# Patient Record
Sex: Male | Born: 1965 | Race: White | Hispanic: No | Marital: Married | Smoking: Never smoker
Health system: Southern US, Community
[De-identification: ages and names within clinical notes are randomized; demographics above are authoritative.]

## PROBLEM LIST (undated history)

## (undated) ENCOUNTER — Ambulatory Visit: Admission: EM | Source: Home / Self Care

## (undated) DIAGNOSIS — J45909 Unspecified asthma, uncomplicated: Secondary | ICD-10-CM

## (undated) DIAGNOSIS — F32A Depression, unspecified: Secondary | ICD-10-CM

## (undated) DIAGNOSIS — I10 Essential (primary) hypertension: Secondary | ICD-10-CM

## (undated) HISTORY — PX: KNEE ARTHROSCOPY: SUR90

## (undated) HISTORY — DX: Depression, unspecified: F32.A

## (undated) HISTORY — PX: CHOLECYSTECTOMY: SHX55

## (undated) HISTORY — PX: NECK SURGERY: SHX720

## (undated) HISTORY — PX: REPLACEMENT TOTAL KNEE: SUR1224

## (undated) HISTORY — DX: Unspecified asthma, uncomplicated: J45.909

## (undated) HISTORY — PX: LUMBAR DISC SURGERY: SHX700

## (undated) HISTORY — DX: Essential (primary) hypertension: I10

---

## 2008-08-02 ENCOUNTER — Encounter: Admission: RE | Admit: 2008-08-02 | Discharge: 2008-08-02 | Payer: Self-pay | Admitting: Orthopedic Surgery

## 2008-09-06 ENCOUNTER — Ambulatory Visit (HOSPITAL_COMMUNITY): Admission: RE | Admit: 2008-09-06 | Discharge: 2008-09-07 | Payer: Self-pay | Admitting: Neurosurgery

## 2011-03-19 NOTE — Op Note (Signed)
NAMEJAILIN, Erik Ellis              ACCOUNT NO.:  [id number]   MEDICAL RECORD NO.:  [id number]          PATIENT TYPE:  OIB   LOCATION:  3599                         FACILITY:  [facility]   PHYSICIAN:  Clydene Fake, M.D.  DATE OF BIRTH:  09-19-1966   DATE OF PROCEDURE:  09/06/2008  DATE OF DISCHARGE:                               OPERATIVE REPORT   PREOPERATIVE DIAGNOSIS:  Herniated nucleus pulposus, C6-C7 with  radiculopathy.   POSTOPERATIVE DIAGNOSIS:  Herniated nucleus pulposus, C6-C7 with  radiculopathy.   PROCEDURE:  Anterior cervical decompression, diskectomy and fusion at C6-  C7 with LifeNet allograft bone and Trestle anterior cervical plate.   SURGEON:  Clydene Fake, MD   ASSISTANT:  Payton Doughty, MD   ANESTHESIA:  General endotracheal tube anesthesia.   ESTIMATED BLOOD LOSS:  Minimal.   BLOOD GIVEN:  None.   COMPLICATIONS:  None.   REASON FOR PROCEDURE:  The patient is a 45 year old gentleman with neck  and left arm pain, numbness, and weakness.  An MRI showing large disk  herniation at right side at C6-C7 and probably a small disk herniation  in the foramen on the left side causing femoral narrowing and left C7  radicular compression.  It was recommended that the patient undergo a  surgery.  Because of the weakness, he wanted a little more time.  We  tried to reduce back his pain medicines, he still had significant pain  and weakness, even an epidural injection but it did not give any lasting  relief and he decided to proceed with surgery.   PROCEDURE IN DETAIL:  The patient was brought to the operating room and  general anesthesia was induced.  The patient was placed in 10-pound  halter traction, prepped and draped in a sterile fashion.  The area of  incision injected with 10 mL of 1% lidocaine with epinephrine.  Incision  was then made in the anterior cervical spine on the left side from  midline to the anterior border of sternocleidomastoid muscle.   Incision  was taken down to the platysma and hemostasis was obtained with Bovie  cauterization.  The platysma was incised with Bovie and blunt dissection  taken through the anterior cervical fascia to the anterior cervical  spine.  Needle was placed in the interspace.  X-rays obtained showing  the needle was at the C6-C7 interspace.  Disk space was incised.  Partial diskectomy was performed with pituitary rongeurs and the needle  was removed.  Longus coli muscles were reflected laterally on each side  using the Bovie and self-retaining retractors were placed.  Distraction  pins were placed at the C6 and C7 interspace and distracted the disk  space.  It was again incised with a #15 blade and diskectomy performed  with pituitary rongeurs and curettes.  Microscope was brought in for  microdissection.  At this point, a high-speed and 1-2 mm Kerrison  punches were used to remove the posterior disk osteophytes and ligament  and performed bilateral foraminotomies.  There was annular tear and free  fragmented disk in the paracentral region on the right  that was seen and  then we removed some multiple free fragments from the left foramen that  was causing compression of the nerve root out on the foramen.  When we  were finished, we had good central and lateral decompressions  bilaterally at C6-C7 level.  High-speed drill was used to remove  cartilaginous endplates.  We measured the height of disk space to be 6  mm and a 6-mm LifeNet allograft bone was tapped into place and  countersunk couple of millimeters.  Weight was removed from the  traction.  Distraction was removed and distraction pins were removed.  Hemostasis was obtained with Gelfoam and thrombin.  We irrigated with  antibiotic solution.  We had good hemostasis.  The Trestle anterior  cervical plate was placed over the anterior cervical spine.  Two screws  were placed in the C6 and two in the C7.  These were tightened down and  lateral  x-rays were obtained showing good position of plates, screws,  and bone plug at the C6-C7 level.  Retractors were removed.  We  irrigated with antibiotic solution with good hemostasis and the platysma  was closed with 3-0 Vicryl interrupted sutures.  Subcutaneous tissue  closed with 3-0 Vicryl interrupted sutures.  Skin closed with benzoin  and Steri-Strip.  Dressing was placed.  The patient was placed in a soft  cervical collar, awoken from anesthesia, and transferred to recovery  room in a stable condition.           ______________________________  Clydene Fake, M.D.     JRH/MEDQ  D:  09/06/2008  T:  09/07/2008  Job:  [id number]

## 2011-08-06 LAB — URINALYSIS, ROUTINE W REFLEX MICROSCOPIC
Hgb urine dipstick: NEGATIVE
Ketones, ur: NEGATIVE
Nitrite: NEGATIVE
Protein, ur: NEGATIVE
Specific Gravity, Urine: 1.019

## 2011-08-06 LAB — BASIC METABOLIC PANEL
Chloride: 103
GFR calc Af Amer: >60
GFR calc non Af Amer: >60
Glucose, Bld: 94

## 2011-08-06 LAB — CBC
MCHC: 33.8
MCV: 98.4
RBC: 4.35

## 2011-12-04 DIAGNOSIS — G479 Sleep disorder, unspecified: Secondary | ICD-10-CM | POA: Insufficient documentation

## 2012-09-02 DIAGNOSIS — K5790 Diverticulosis of intestine, part unspecified, without perforation or abscess without bleeding: Secondary | ICD-10-CM | POA: Insufficient documentation

## 2012-09-02 DIAGNOSIS — Z9889 Other specified postprocedural states: Secondary | ICD-10-CM | POA: Insufficient documentation

## 2014-08-15 ENCOUNTER — Emergency Department (HOSPITAL_COMMUNITY): Payer: BC Managed Care – PPO

## 2014-08-15 ENCOUNTER — Encounter (HOSPITAL_COMMUNITY): Payer: Self-pay | Admitting: Emergency Medicine

## 2014-08-15 ENCOUNTER — Emergency Department (HOSPITAL_COMMUNITY)
Admission: EM | Admit: 2014-08-15 | Discharge: 2014-08-15 | Disposition: A | Discharge status: Home / Self Care | Payer: BC Managed Care – PPO | Attending: Emergency Medicine | Admitting: Emergency Medicine

## 2014-08-15 DIAGNOSIS — R079 Chest pain, unspecified: Secondary | ICD-10-CM | POA: Diagnosis present

## 2014-08-15 DIAGNOSIS — Z7951 Long term (current) use of inhaled steroids: Secondary | ICD-10-CM | POA: Insufficient documentation

## 2014-08-15 DIAGNOSIS — M549 Dorsalgia, unspecified: Secondary | ICD-10-CM | POA: Insufficient documentation

## 2014-08-15 DIAGNOSIS — Z79899 Other long term (current) drug therapy: Secondary | ICD-10-CM | POA: Diagnosis not present

## 2014-08-15 LAB — CBC WITH DIFFERENTIAL/PLATELET
Basophils Absolute: 0 10*3/uL (ref 0.0–0.1)
Basophils Relative: 0 % (ref 0–1)
EOS ABS: 0.1 10*3/uL (ref 0.0–0.7)
EOS PCT: 1 % (ref 0–5)
HCT: 42.3 % (ref 39.0–52.0)
HEMOGLOBIN: 14.2 g/dL (ref 13.0–17.0)
LYMPHS PCT: 13 % (ref 12–46)
Lymphs Abs: 1.4 10*3/uL (ref 0.7–4.0)
MCH: 31.6 pg (ref 26.0–34.0)
MCHC: 33.6 g/dL (ref 30.0–36.0)
MCV: 94 fL (ref 78.0–100.0)
Monocytes Absolute: 0.5 10*3/uL (ref 0.1–1.0)
Monocytes Relative: 5 % (ref 3–12)
Neutro Abs: 8.8 10*3/uL — ABNORMAL HIGH (ref 1.7–7.7)
Neutrophils Relative %: 81 % — ABNORMAL HIGH (ref 43–77)
Platelets: 276 10*3/uL (ref 150–400)
RBC: 4.5 MIL/uL (ref 4.22–5.81)
RDW: 12.4 % (ref 11.5–15.5)
WBC: 10.9 10*3/uL — AB (ref 4.0–10.5)

## 2014-08-15 LAB — BASIC METABOLIC PANEL
Anion gap: 9 (ref 5–15)
BUN: 19 mg/dL (ref 6–23)
CALCIUM: 10 mg/dL (ref 8.4–10.5)
CO2: 29 mEq/L (ref 19–32)
Chloride: 105 mEq/L (ref 96–112)
Creatinine, Ser: 1.28 mg/dL (ref 0.50–1.35)
GFR calc Af Amer: 74 mL/min — ABNORMAL LOW (ref >90)
GFR, EST NON AFRICAN AMERICAN: 64 mL/min — AB (ref >90)
Glucose, Bld: 87 mg/dL (ref 70–99)
Potassium: 4.4 mEq/L (ref 3.7–5.3)
SODIUM: 143 meq/L (ref 137–147)

## 2014-08-15 LAB — TROPONIN I: Troponin I: <0.3 ng/mL (ref <0.30)

## 2014-08-15 LAB — I-STAT TROPONIN, ED: Troponin i, poc: 0 ng/mL (ref 0.00–0.08)

## 2014-08-15 MED ORDER — CYCLOBENZAPRINE HCL 10 MG PO TABS: 10.0000 mg | ORAL_TABLET | Freq: Two times a day (BID) | ORAL | Status: DC | PRN

## 2014-08-15 MED ORDER — NAPROXEN 250 MG PO TABS
500.0000 mg | ORAL_TABLET | Freq: Once | ORAL | Status: AC
  Administered 2014-08-15: 500 mg via ORAL

## 2014-08-15 MED ORDER — CYCLOBENZAPRINE HCL 10 MG PO TABS
ORAL_TABLET | ORAL | Status: AC
  Filled 2014-08-15: qty 1

## 2014-08-15 MED ORDER — NAPROXEN 500 MG PO TABS: 500.0000 mg | ORAL_TABLET | Freq: Two times a day (BID) | ORAL | Status: DC

## 2014-08-15 MED ORDER — NAPROXEN 250 MG PO TABS
ORAL_TABLET | ORAL | Status: AC
  Filled 2014-08-15: qty 2

## 2014-08-15 MED ORDER — CYCLOBENZAPRINE HCL 10 MG PO TABS
10.0000 mg | ORAL_TABLET | Freq: Once | ORAL | Status: AC
  Administered 2014-08-15: 10 mg via ORAL

## 2014-08-15 MED ORDER — KETOROLAC TROMETHAMINE 30 MG/ML IJ SOLN
30.0000 mg | Freq: Once | INTRAMUSCULAR | Status: AC
  Administered 2014-08-15: 30 mg via INTRAVENOUS
  Filled 2014-08-15: qty 1

## 2014-08-15 NOTE — ED Provider Notes (Signed)
CSN: [id number]     Arrival date & time 08/15/14  [id number] History  This chart was scribed for Donnetta HutchingBrian Loraina Stauffer, MD by Gwenyth Oberatherine Macek, ED Scribe. This patient was seen in room APA12/APA12 and the patient's care was started at 8:10 PM.    Chief Complaint  Patient presents with  . Chest Pain   The history is provided by the patient. No language interpreter was used.   HPI Comments: Vikki PortsMichael Cecena is a 48 y.o. 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 He notes feeling flushed as an associated symptom. Pt states he became excited during football practice and noted feeling CP in a team meeting. Pt denies SOB and diaphoresis as associated symptoms. Pt has history of knee surgeries, cholecystectomy, and a ruptured disk in cervical spine. Pt denies a history of HTN, DM, and HLD. He also denies smoking and family history of CAD.   History reviewed. No pertinent past medical history. Past Surgical History  Procedure Laterality Date  . Knee arthroscopy    . Cholecystectomy    . Neck surgery     History reviewed. No pertinent family history. History  Substance Use Topics  . Smoking status: Never Smoker   . Smokeless tobacco: Not on file  . Alcohol Use: No    Review of Systems  Constitutional: Negative for diaphoresis.  Respiratory: Negative for shortness of breath.   Cardiovascular: Positive for chest pain.  Musculoskeletal: Positive for back pain.    10 Systems reviewed and all are negative for acute change except as noted in the HPI.  Allergies  Review of patient's allergies indicates no known allergies.  Home Medications   Prior to Admission medications   Medication Sig Start Date End Date Taking? Authorizing Provider  albuterol (PROVENTIL HFA;VENTOLIN HFA) 108 (90 BASE) MCG/ACT inhaler Inhale 2 puffs into the lungs every 4 (four)  hours as needed. For shortness of breath/wheezing 02/17/14  Yes Historical Provider, MD  amphetamine-dextroamphetamine (ADDERALL) 20 MG tablet Take 20 mg by mouth every morning.   Yes Historical Provider, MD  buPROPion (WELLBUTRIN XL) 300 MG 24 hr tablet Take 1 tablet by mouth daily. 07/27/14  Yes Historical Provider, MD  busPIRone (BUSPAR) 15 MG tablet Take 30 mg by mouth 2 (two) times daily. 07/24/14  Yes Historical Provider, MD  lamoTRIgine (LAMICTAL) 200 MG tablet Take 200 mg by mouth 2 (two) times daily. 08/14/14  Yes Historical Provider, MD  lithium carbonate (LITHOBID) 300 MG CR tablet Take 1,200 mg by mouth at bedtime. 07/06/14  Yes Historical Provider, MD  mometasone-formoterol (DULERA) 100-5 MCG/ACT AERO Inhale 2 puffs into the lungs 2 (two) times daily. 05/23/14  Yes Historical Provider, MD  zolpidem (AMBIEN) 10 MG tablet Take 5-10 mg by mouth at bedtime.  07/26/14  Yes Historical Provider, MD  cyclobenzaprine (FLEXERIL) 10 MG tablet Take 1 tablet (10 mg total) by mouth 2 (two) times daily as needed for muscle spasms. 08/15/14   Donnetta HutchingBrian Fields Oros, MD  naproxen (NAPROSYN) 500 MG tablet Take 1 tablet (500 mg total) by mouth 2 (two) times daily. 08/15/14   Donnetta HutchingBrian Jordyan Hardiman, MD   BP 116/73  Pulse 58  Temp(Src) 98.6 F (37 C) (Oral)  Resp 14  Ht 5\' 9"  (1.753 m)  Wt 172 lb (78.019 kg)  BMI 25.39 kg/m2  SpO2 100% Physical Exam  Nursing note and vitals reviewed. Constitutional: He is  oriented to person, place, and time. He appears well-developed and well-nourished.  HENT:  Head: Normocephalic and atraumatic.  Eyes: Conjunctivae and EOM are normal. Pupils are equal, round, and reactive to light.  Neck: Normal range of motion. Neck supple.  Cardiovascular: Normal rate, regular rhythm and normal heart sounds.   Pulmonary/Chest: Effort normal and breath sounds normal.  Abdominal: Soft. Bowel sounds are normal.  Musculoskeletal: Normal range of motion.  Neurological: He is alert and oriented to person,  place, and time.  Skin: Skin is warm and dry.  Psychiatric: He has a normal mood and affect. His behavior is normal.    ED Course  Procedures (including critical care time) DIAGNOSTIC STUDIES: Oxygen Saturation is 100% on RA, normal by my interpretation.    COORDINATION OF CARE: 8:17 PM Will order lab work and pain medication. He discussed treatment plan with pt at bedside and pt agreed to plan.    Labs Review Labs Reviewed  CBC WITH DIFFERENTIAL - Abnormal; Notable for the following:    WBC 10.9 (*)    Neutrophils Relative % 81 (*)    Neutro Abs 8.8 (*)    All other components within normal limits  BASIC METABOLIC PANEL - Abnormal; Notable for the following:    GFR calc non Af Amer 64 (*)    GFR calc Af Amer 74 (*)    All other components within normal limits  TROPONIN I  I-STAT TROPOININ, ED    Imaging Review Dg Chest 2 View  08/15/2014   CLINICAL DATA:  Acute onset of right-sided chest pain.  Move  EXAM: CHEST  2 VIEW  COMPARISON:  None.  FINDINGS: Anterior cervical fusion noted. Normal mediastinum and cardiac silhouette. Normal pulmonary vasculature. No evidence of effusion, infiltrate, or pneumothorax. No acute bony abnormality.  IMPRESSION: No active cardiopulmonary disease.   Electronically Signed   By: Genevive BiStewart  Edmunds M.D.   On: 08/15/2014 20:00     EKG Interpretation   Date/Time:  Monday August 15 2014 19:40:17 EDT Ventricular Rate:  53 PR Interval:  156 QRS Duration: 104 QT Interval:  402 QTC Calculation: 377 R Axis:   63 Text Interpretation:  Sinus bradycardia Otherwise normal ECG Confirmed by  Tally Mckinnon  MD, Aminata Buffalo ([id number]) on 08/15/2014 8:28:58 PM      MDM   Final diagnoses:  Chest pain, unspecified chest pain type   Patient is extremely low risk for acute coronary syndrome or pulmonary embolism. Vital signs were normal. Screening labs, troponin, EKG, chest x-ray all negative. Discharge medications Naprosyn 500 mg and Flexeril 10 mg  I personally  performed the services described in this documentation, which was scribed in my presence. The recorded information has been reviewed and is accurate.    Donnetta HutchingBrian Moody Robben, MD 08/15/14 [id number]

## 2014-08-15 NOTE — ED Notes (Signed)
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 Denies other symptoms.

## 2014-08-15 NOTE — Discharge Instructions (Signed)
Chest Wall Pain Chest wall pain is pain in or around the bones and muscles of your chest. It may take up to 6 weeks to get better. It may take longer if you must stay physically active in your work and activities.  CAUSES  Chest wall pain may happen on its own. However, it may be caused by:  A viral illness like the flu.  Injury.  Coughing.  Exercise.  Arthritis.  Fibromyalgia.  Shingles. HOME CARE INSTRUCTIONS   Avoid overtiring physical activity. Try not to strain or perform activities that cause pain. This includes any activities using your chest or your abdominal and side muscles, especially if heavy weights are used.  Put ice on the sore area.  Put ice in a plastic bag.  Place a towel between your skin and the bag.  Leave the ice on for 15-20 minutes per hour while awake for the first 2 days.  Only take over-the-counter or prescription medicines for pain, discomfort, or fever as directed by your caregiver. SEEK IMMEDIATE MEDICAL CARE IF:   Your pain increases, or you are very uncomfortable.  You have a fever.  Your chest pain becomes worse.  You have new, unexplained symptoms.  You have nausea or vomiting.  You feel sweaty or lightheaded.  You have a cough with phlegm (sputum), or you cough up blood. MAKE SURE YOU:   Understand these instructions.  Will watch your condition.  Will get help right away if you are not doing well or get worse. Document Released: 10/21/2005 Document Revised: 01/13/2012 Document Reviewed: 06/17/2011 [facility] Patient Information 2015 [employer], [place]. This information is not intended to replace advice given to you by your health care provider. Make sure you discuss any questions you have with your health care provider.  Tests were good.   Medications for pain and muscle relaxation. Followup your primary care doctor

## 2015-05-31 ENCOUNTER — Ambulatory Visit (INDEPENDENT_AMBULATORY_CARE_PROVIDER_SITE_OTHER): Payer: BC Managed Care – PPO | Admitting: Family Medicine

## 2015-05-31 ENCOUNTER — Encounter: Payer: Self-pay | Admitting: Family Medicine

## 2015-05-31 VITALS — BP 126/82 | Ht 69.0 in | Wt 165.4 lb

## 2015-05-31 DIAGNOSIS — J4541 Moderate persistent asthma with (acute) exacerbation: Secondary | ICD-10-CM

## 2015-05-31 DIAGNOSIS — F319 Bipolar disorder, unspecified: Secondary | ICD-10-CM | POA: Diagnosis not present

## 2015-05-31 DIAGNOSIS — K219 Gastro-esophageal reflux disease without esophagitis: Secondary | ICD-10-CM | POA: Insufficient documentation

## 2015-05-31 DIAGNOSIS — J45901 Unspecified asthma with (acute) exacerbation: Secondary | ICD-10-CM | POA: Insufficient documentation

## 2015-05-31 MED ORDER — PANTOPRAZOLE SODIUM 40 MG PO TBEC: 40.0000 mg | DELAYED_RELEASE_TABLET | Freq: Every day | ORAL | Status: DC

## 2015-05-31 MED ORDER — MONTELUKAST SODIUM 10 MG PO TABS: 10.0000 mg | ORAL_TABLET | Freq: Every day | ORAL | Status: DC

## 2015-05-31 MED ORDER — FLUTICASONE-SALMETEROL 115-21 MCG/ACT IN AERO: 2.0000 | INHALATION_SPRAY | Freq: Two times a day (BID) | RESPIRATORY_TRACT | Status: DC

## 2015-05-31 NOTE — Progress Notes (Signed)
   Subjective:    Patient ID: Erik Ellis, male    DOB: 1966/10/15, 49 y.o.   MRN: [id number]  HPI  Patient arrives with c/o of worsening asthma sx for 2 months.  PE [place] [employer] has been fairly healthy down thru the yrsa  ssthma last eleven yrs  Fa had asthma  No hx of smoking    Kicked dry cough, sob with exercise, notes wheezing and tightness  Usually takes dulera off and on  Went to [facility] twice  Had steroids, breathing rx, pred often helps.  Took erythromycin and pcn subsided, took pred for twelve days, and now effective  dulera not helping  pcn seems to have helped  [facility] doc in w s, meds do good  No hx of alergies  Gets back into it  Tried one in the past,        Patient states he has flares usually in the summer but this is one of the worse he has had.   Review of Systems No headache no chest pain no back pain no change in bowel habits cough generally nonproductive    Objective:   Physical Exam  Alert vitals stable. HEENT normal. Lungs currently clear heart regular in rhythm.      Assessment & Plan:  Impression persistent asthma with 2 recent flares. Dulera not helping. Generally goes many months without a flare. Often not using any inhalers at all. Uses Dulera more for post flare management plan stop to wear a. Start Advair. Start Singulair. Start protonic. Patient does have an element of reflux. Follow-up in several weeks. WSL

## 2015-09-06 ENCOUNTER — Telehealth: Payer: Self-pay | Admitting: Family Medicine

## 2015-09-06 NOTE — Telephone Encounter (Signed)
Rx prior auth APPROVED for pt's pantoprazole (PROTONIX) 40 MG tablet, valid 08/02/15-08/31/16 through [employer], faxed approval to [facility]

## 2015-09-27 ENCOUNTER — Other Ambulatory Visit: Payer: Self-pay | Admitting: Family Medicine

## 2015-10-06 IMAGING — CR DG CHEST 2V
2 series · 2 of 2 positions shown · non-contrast
Comparison: None.

CLINICAL DATA: Acute onset of right-sided chest pain.  Move

EXAM:
CHEST  2 VIEW

[view not recorded (1 of 2)]
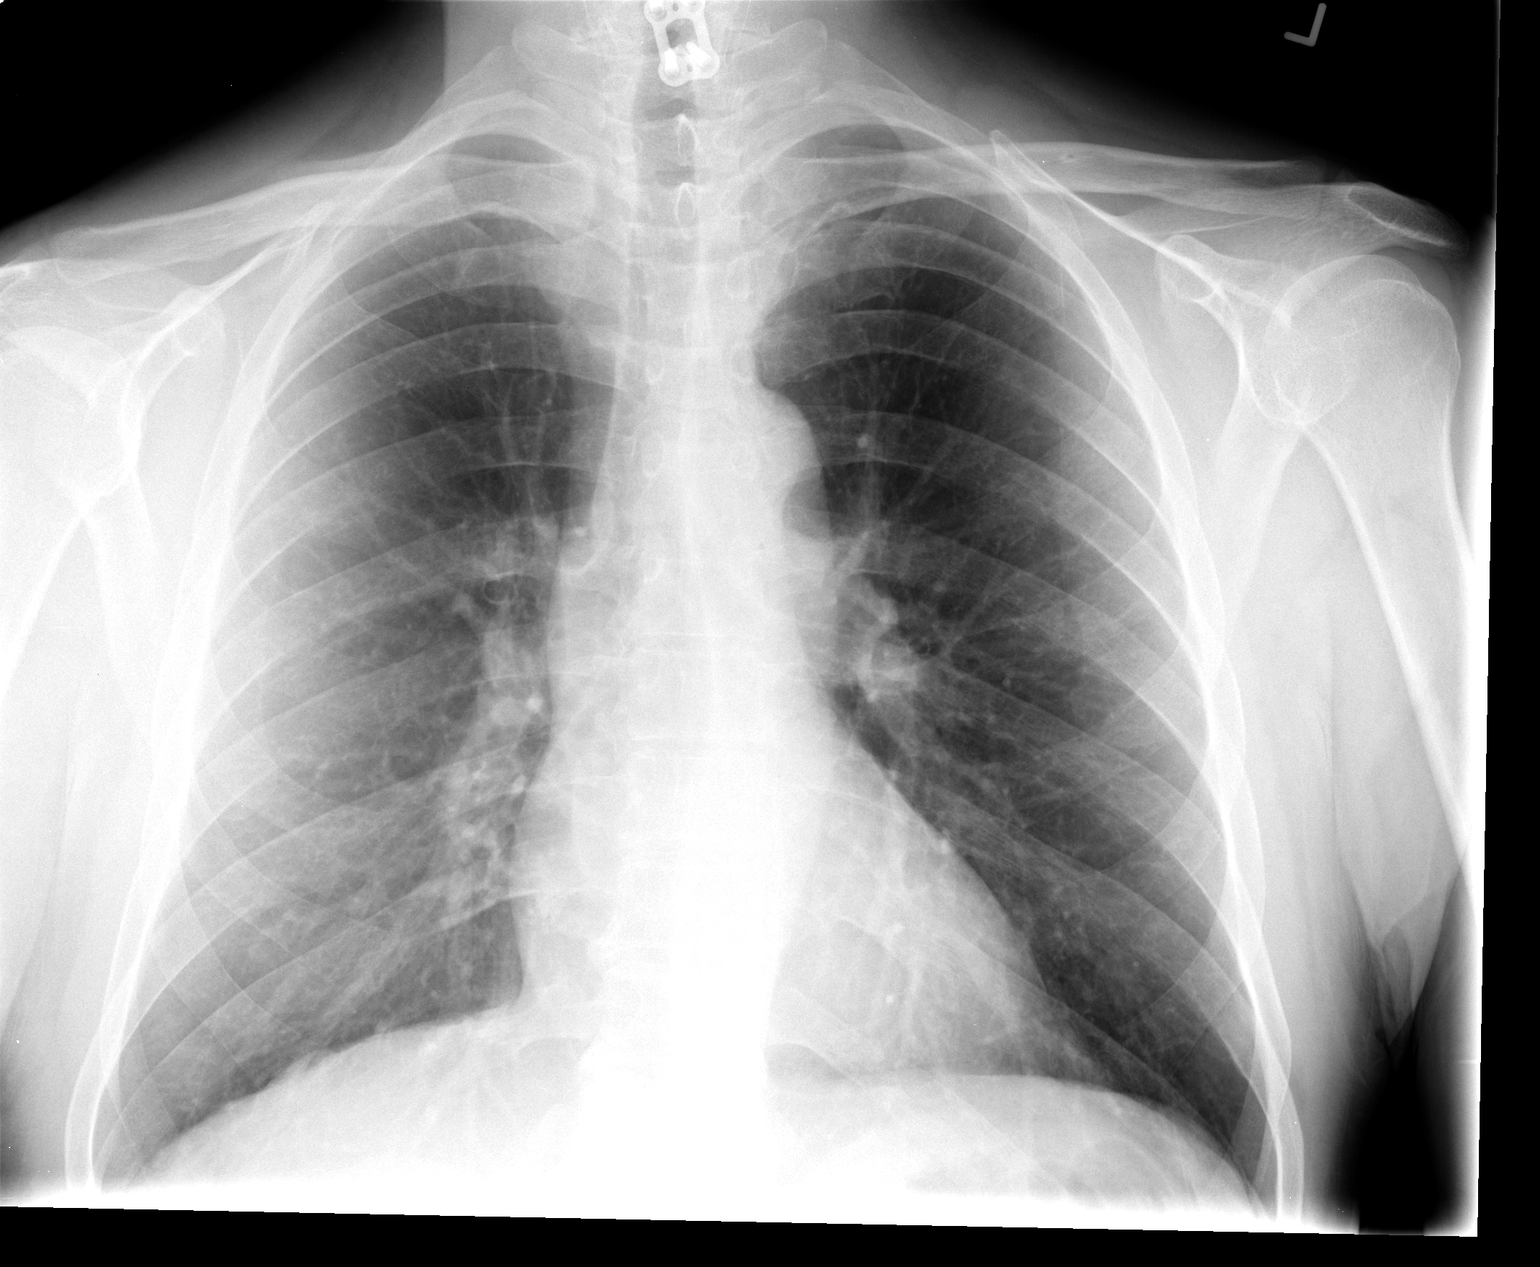

[view not recorded (2 of 2)]
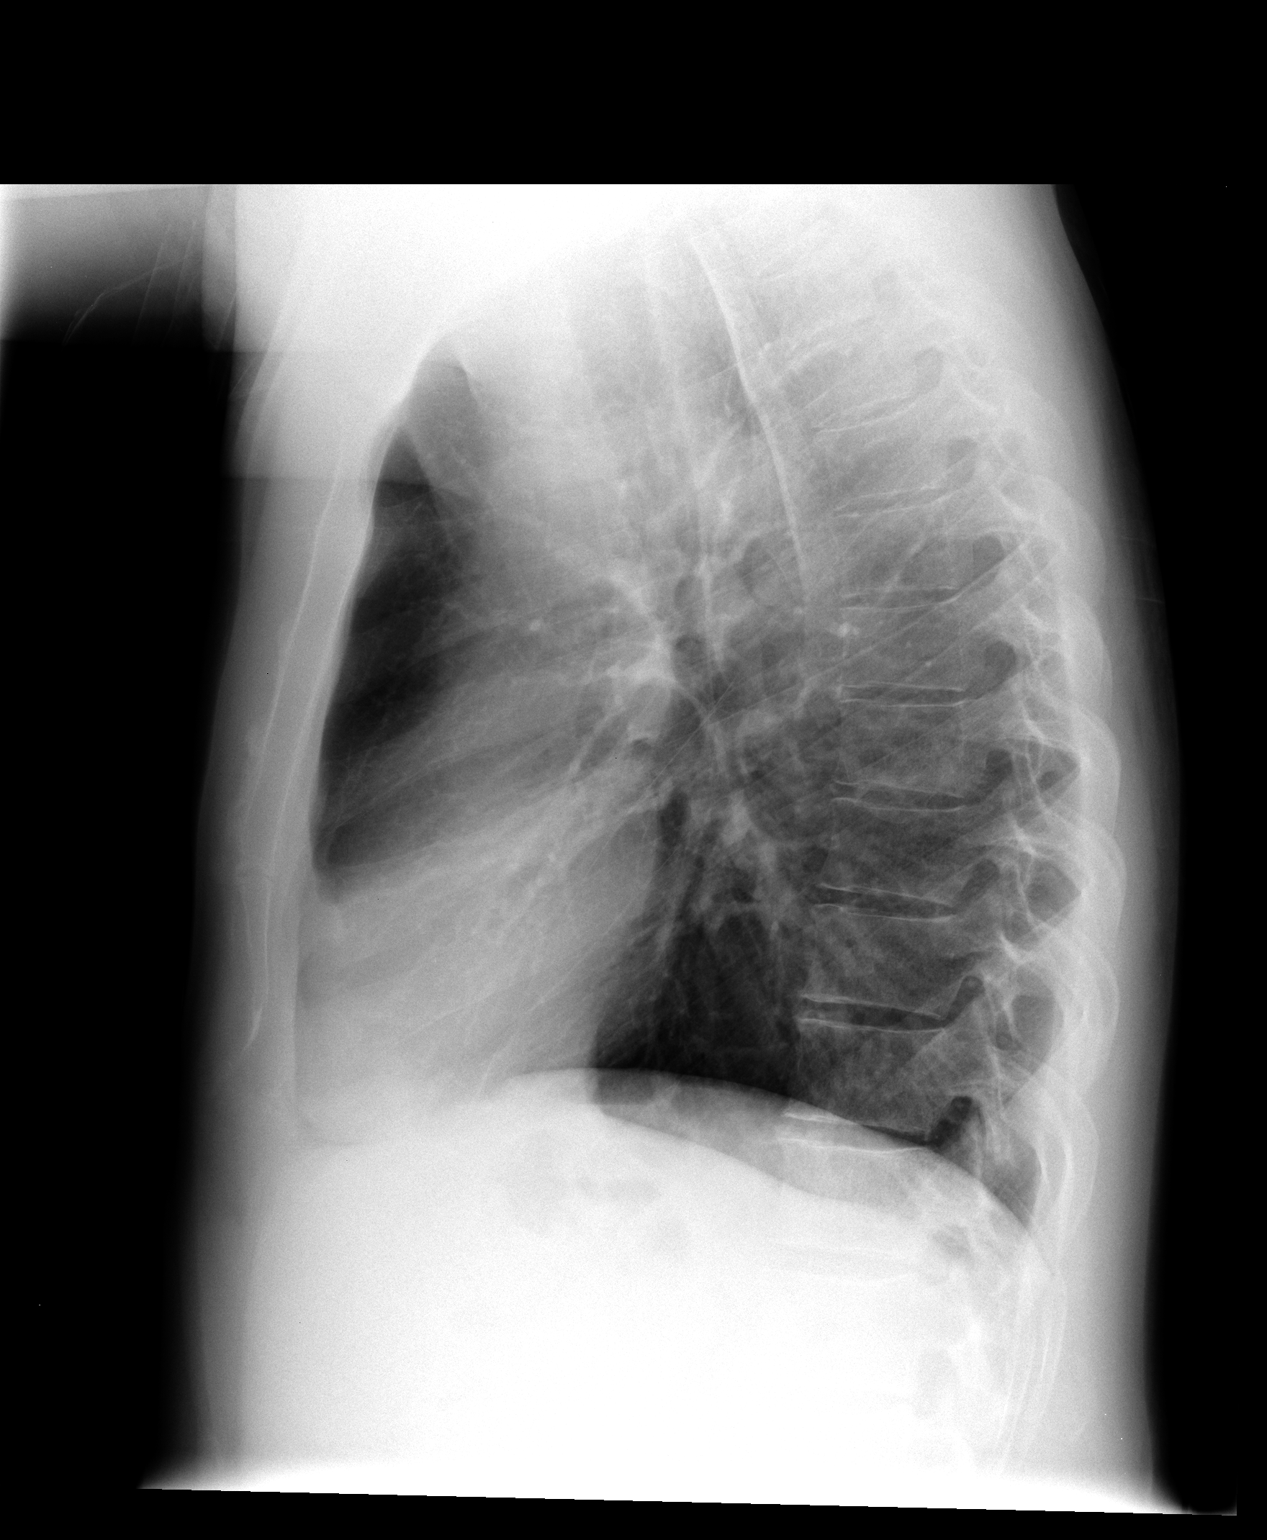

[2 of 2 positions shown; findings below may reference images not displayed]

FINDINGS: Anterior cervical fusion noted. Normal mediastinum and cardiac
silhouette. Normal pulmonary vasculature. No evidence of effusion,
infiltrate, or pneumothorax. No acute bony abnormality.
IMPRESSION: No active cardiopulmonary disease.

## 2015-10-25 ENCOUNTER — Other Ambulatory Visit: Payer: Self-pay | Admitting: Family Medicine

## 2015-12-02 ENCOUNTER — Other Ambulatory Visit: Payer: Self-pay | Admitting: Family Medicine

## 2015-12-21 ENCOUNTER — Other Ambulatory Visit: Payer: Self-pay | Admitting: Family Medicine

## 2016-01-13 ENCOUNTER — Other Ambulatory Visit: Payer: Self-pay | Admitting: Family Medicine

## 2016-02-13 ENCOUNTER — Ambulatory Visit (INDEPENDENT_AMBULATORY_CARE_PROVIDER_SITE_OTHER): Payer: BC Managed Care – PPO | Admitting: Family Medicine

## 2016-02-13 ENCOUNTER — Encounter: Payer: Self-pay | Admitting: Family Medicine

## 2016-02-13 VITALS — BP 118/74 | Temp 98.7°F | Ht 69.0 in | Wt 174.0 lb

## 2016-02-13 DIAGNOSIS — J4541 Moderate persistent asthma with (acute) exacerbation: Secondary | ICD-10-CM

## 2016-02-13 DIAGNOSIS — K219 Gastro-esophageal reflux disease without esophagitis: Secondary | ICD-10-CM | POA: Diagnosis not present

## 2016-02-13 DIAGNOSIS — J301 Allergic rhinitis due to pollen: Secondary | ICD-10-CM

## 2016-02-13 MED ORDER — FLUTICASONE PROPIONATE 50 MCG/ACT NA SUSP: 2.0000 | Freq: Every day | NASAL | Status: DC

## 2016-02-13 MED ORDER — FLUTICASONE-SALMETEROL 115-21 MCG/ACT IN AERO: INHALATION_SPRAY | RESPIRATORY_TRACT | Status: DC

## 2016-02-13 MED ORDER — PREDNISONE 20 MG PO TABS: ORAL_TABLET | ORAL | Status: DC

## 2016-02-13 MED ORDER — MONTELUKAST SODIUM 10 MG PO TABS: ORAL_TABLET | ORAL | Status: DC

## 2016-02-13 MED ORDER — ALBUTEROL SULFATE HFA 108 (90 BASE) MCG/ACT IN AERS: 2.0000 | INHALATION_SPRAY | RESPIRATORY_TRACT | Status: DC | PRN

## 2016-02-13 MED ORDER — PANTOPRAZOLE SODIUM 40 MG PO TBEC: DELAYED_RELEASE_TABLET | ORAL | Status: DC

## 2016-02-13 NOTE — Progress Notes (Signed)
   Subjective:    Patient ID: Erik Ellis, male    DOB: 08/07/1966, 50 y.o.   MRN: [id number]  Asthma He complains of cough, shortness of breath and wheezing. This is a chronic problem. His past medical history is significant for asthma.   Needs refills on advair, albuterol, and protonix.   Asthma flaring up thr past couple weeks, overall is been doing well  Dry cough last few weeks  Sob trouble with exertion next  Patient reports reflux symptoms. Aggravating at times. Occurring more days and not. Occurs with many different types of foods  Reports allergies have acted up this spring. Singulair overall is helpful. Patient also utilizes antihistamines when necessary. Along with regular Flonase Review of Systems  Respiratory: Positive for cough, shortness of breath and wheezing.    no fever or chills     Objective:   Physical Exam  Alert vital stable HET moderate his congestion lungs rare wheeze with deep breath heart regular in rhythm no tachypnea no crackles      Assessment & Plan:  Impression exacerbation asthma with generally stable symptoms up until lately #2 allergic rhinitis decent control to recently #3 reflux discussed ongoing challenges plan prednisone taper rationale discussed. Maintain same dose of Advair as far as a chronic dose, maintain Flonase and Singulair rationale discussed diet exercise discussed recommended wellness exam this summer patient states she will schedule WSL

## 2016-02-17 DIAGNOSIS — J309 Allergic rhinitis, unspecified: Secondary | ICD-10-CM | POA: Insufficient documentation

## 2016-03-19 ENCOUNTER — Encounter: Payer: Self-pay | Admitting: Family Medicine

## 2016-03-19 ENCOUNTER — Ambulatory Visit (INDEPENDENT_AMBULATORY_CARE_PROVIDER_SITE_OTHER): Payer: BC Managed Care – PPO | Admitting: Family Medicine

## 2016-03-19 VITALS — BP 102/70 | Temp 97.8°F | Ht 69.0 in | Wt 173.5 lb

## 2016-03-19 DIAGNOSIS — R0789 Other chest pain: Secondary | ICD-10-CM

## 2016-03-19 NOTE — Progress Notes (Signed)
   Subjective:    Patient ID: Erik Ellis, male    DOB: 11-Jun-1966, 50 y.o.   MRN: [id number]  Chest Pain  This is a new problem. The current episode started in the past 7 days. The problem occurs intermittently. The problem has been unchanged. The pain is moderate. The quality of the pain is described as dull. The pain does not radiate. The pain is aggravated by nothing. He has tried nothing for the symptoms. The treatment provided no relief. There are no known risk factors.   Patient states that he has no other concerns at this time.  Pain right side of chest with tightness and dull ache  Going on for several days  Deep breath coes not hurt  Some motion leads to discomfort  exercise during does not experience chest discomfort. Next  No obvious shortness breath nausea or diaphoresis. Next  Recalls no injury. 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 He found himself tensing up very much. Negative workup for chest discomfort in emergency room in the past  Review of Systems  Cardiovascular: Positive for chest pain.       Objective:   Physical Exam  Alert vital stable no acute distress HEENT normal lungs clear heart rare rhythm ankles without edema i  Have mpression 1 chest pain likely chest wall discussed likely aggravated by stress. Discussed. Highly doubt significant pathology. EKG completely normal. Recommend stress reduction measures these were discussed. Along with anti-inflammatory medicine when necessary for the chest discomfort WSL      Assessment & Plan:  p

## 2016-04-19 ENCOUNTER — Ambulatory Visit (INDEPENDENT_AMBULATORY_CARE_PROVIDER_SITE_OTHER): Payer: BC Managed Care – PPO | Admitting: Family Medicine

## 2016-04-19 ENCOUNTER — Encounter: Payer: Self-pay | Admitting: Family Medicine

## 2016-04-19 VITALS — BP 122/80 | Ht 68.0 in | Wt 173.1 lb

## 2016-04-19 DIAGNOSIS — Z0000 Encounter for general adult medical examination without abnormal findings: Secondary | ICD-10-CM | POA: Diagnosis not present

## 2016-04-19 DIAGNOSIS — Z139 Encounter for screening, unspecified: Secondary | ICD-10-CM | POA: Diagnosis not present

## 2016-04-19 MED ORDER — PANTOPRAZOLE SODIUM 40 MG PO TBEC: DELAYED_RELEASE_TABLET | ORAL | Status: DC

## 2016-04-19 MED ORDER — MONTELUKAST SODIUM 10 MG PO TABS: ORAL_TABLET | ORAL | Status: DC

## 2016-04-19 MED ORDER — FLUTICASONE-SALMETEROL 115-21 MCG/ACT IN AERO: INHALATION_SPRAY | RESPIRATORY_TRACT | Status: DC

## 2016-04-19 MED ORDER — ALBUTEROL SULFATE HFA 108 (90 BASE) MCG/ACT IN AERS: 2.0000 | INHALATION_SPRAY | RESPIRATORY_TRACT | Status: DC | PRN

## 2016-04-19 MED ORDER — FLUTICASONE PROPIONATE 50 MCG/ACT NA SUSP: 2.0000 | Freq: Every day | NASAL | Status: DC

## 2016-04-19 NOTE — Progress Notes (Signed)
   Subjective:    Patient ID: Erik Ellis, male    DOB: [birth year], 50 y.o.   MRN: [id number]  HPI The patient comes in today for a wellness visit.    A review of their health history was completed.  A review of medications was also completed.  Any needed refills: none  Eating habits: good  Falls/  MVA accidents in past few months: none  Regular exercise: good  Specialist pt sees on regular basis:  yes  Preventative health issues were discussed.   Additional concerns: Patient still has issues with tiredness and sleepiness.   Having chronic knee discomfort with hx of bilat meniscal surgeryremotely  Eats good no sig junk food, packs lunch   No sig problrem with bad diet  Tired fatgued  Busy with coaching and overly stretchex  Using both ambien and neurontin at night   father had bile duct cnancer,   No prostate   Pt had coonoscopy l Jan 17, five yrs this time as far as follow up   No blood work no high chol, no elev glucose      Review of Systems  Constitutional: Negative for fever, activity change and appetite change.  HENT: Negative for congestion and rhinorrhea.   Eyes: Negative for discharge.  Respiratory: Negative for cough and wheezing.   Cardiovascular: Negative for chest pain.  Gastrointestinal: Negative for vomiting, abdominal pain and blood in stool.  Genitourinary: Negative for frequency and difficulty urinating.  Musculoskeletal: Negative for neck pain.  Skin: Negative for rash.  Allergic/Immunologic: Negative for environmental allergies and food allergies.  Neurological: Negative for weakness and headaches.  Psychiatric/Behavioral: Negative for agitation.  All other systems reviewed and are negative.      Objective:   Physical Exam  Constitutional: He appears well-developed and well-nourished.  HENT:  Head: Normocephalic and atraumatic.  Right Ear: External ear normal.  Left Ear: External ear normal.  Nose: Nose normal.    Mouth/Throat: Oropharynx is clear and moist.  Eyes: EOM are normal. Pupils are equal, round, and reactive to light.  Neck: Normal range of motion. Neck supple. No thyromegaly present.  Cardiovascular: Normal rate, regular rhythm and normal heart sounds.   No murmur heard. Pulmonary/Chest: Effort normal and breath sounds normal. No respiratory distress. He has no wheezes.  Abdominal: Soft. Bowel sounds are normal. He exhibits no distension and no mass. There is no tenderness.  Genitourinary: Penis normal.  Musculoskeletal: Normal range of motion. He exhibits no edema.  Lymphadenopathy:    He has no cervical adenopathy.  Neurological: He is alert. He exhibits normal muscle tone.  Skin: Skin is warm and dry. No erythema.  Psychiatric: He has a normal mood and affect. His behavior is normal. Judgment normal.  Prostate gland within normal limits mild crepitations left knee        Assessment & Plan:  Impression 1 wellness exam #2 asthma clinically improved on current medications #3 bipolar disease with sleep disturbance. Followed by specialist states overall meds doing pretty well planned diet discuss exercise discussed up-to-date on colonoscopy appropriate blood work chronic meds refilled recheck in a year or before when necessary WSL

## 2016-04-21 LAB — HEPATIC FUNCTION PANEL
ALT: 36 IU/L (ref 0–44)
AST: 23 IU/L (ref 0–40)
Albumin: 4.5 g/dL (ref 3.5–5.5)
Alkaline Phosphatase: 105 IU/L (ref 39–117)
Bilirubin Total: 0.4 mg/dL (ref 0.0–1.2)
Bilirubin, Direct: 0.13 mg/dL (ref 0.00–0.40)
TOTAL PROTEIN: 7.5 g/dL (ref 6.0–8.5)

## 2016-04-21 LAB — BASIC METABOLIC PANEL
BUN / CREAT RATIO: 22 — AB (ref 9–20)
BUN: 29 mg/dL — AB (ref 6–24)
CO2: 26 mmol/L (ref 18–29)
CREATININE: 1.32 mg/dL — AB (ref 0.76–1.27)
Calcium: 10.2 mg/dL (ref 8.7–10.2)
Chloride: 101 mmol/L (ref 96–106)
GFR calc Af Amer: 72 mL/min/{1.73_m2} (ref >59)
GFR calc non Af Amer: 62 mL/min/{1.73_m2} (ref >59)
GLUCOSE: 88 mg/dL (ref 65–99)
Potassium: 4.5 mmol/L (ref 3.5–5.2)
SODIUM: 141 mmol/L (ref 134–144)

## 2016-04-21 LAB — LIPID PANEL
CHOL/HDL RATIO: 4.4 ratio (ref 0.0–5.0)
Cholesterol, Total: 194 mg/dL (ref 100–199)
HDL: 44 mg/dL (ref >39)
LDL Calculated: 113 mg/dL — ABNORMAL HIGH (ref 0–99)
TRIGLYCERIDES: 185 mg/dL — AB (ref 0–149)
VLDL CHOLESTEROL CAL: 37 mg/dL (ref 5–40)

## 2016-04-21 LAB — PSA: PROSTATE SPECIFIC AG, SERUM: 1.2 ng/mL (ref 0.0–4.0)

## 2016-04-30 ENCOUNTER — Encounter: Payer: Self-pay | Admitting: Family Medicine

## 2016-04-30 ENCOUNTER — Ambulatory Visit (INDEPENDENT_AMBULATORY_CARE_PROVIDER_SITE_OTHER): Payer: BC Managed Care – PPO | Admitting: Family Medicine

## 2016-04-30 VITALS — BP 138/80 | Ht 68.0 in | Wt 173.0 lb

## 2016-04-30 DIAGNOSIS — R7989 Other specified abnormal findings of blood chemistry: Secondary | ICD-10-CM

## 2016-04-30 DIAGNOSIS — R748 Abnormal levels of other serum enzymes: Secondary | ICD-10-CM

## 2016-04-30 NOTE — Patient Instructions (Signed)
Results for orders placed or performed in visit on 04/19/16  Lipid panel  Result Value Ref Range   Cholesterol, Total 194 100 - 199 mg/dL   Triglycerides 161185 (H) 0 - 149 mg/dL   HDL 44 >09>39 mg/dL   VLDL Cholesterol Cal 37 5 - 40 mg/dL   LDL Calculated 604113 (H) 0 - 99 mg/dL   Chol/HDL Ratio 4.4 0.0 - 5.0 ratio units  Hepatic function panel  Result Value Ref Range   Total Protein 7.5 6.0 - 8.5 g/dL   Albumin 4.5 3.5 - 5.5 g/dL   Bilirubin Total 0.4 0.0 - 1.2 mg/dL   Bilirubin, Direct 5.400.13 0.00 - 0.40 mg/dL   Alkaline Phosphatase 105 39 - 117 IU/L   AST 23 0 - 40 IU/L   ALT 36 0 - 44 IU/L  Basic metabolic panel  Result Value Ref Range   Glucose 88 65 - 99 mg/dL   BUN 29 (H) 6 - 24 mg/dL   Creatinine, Ser 9.811.32 (H) 0.76 - 1.27 mg/dL   GFR calc non Af Amer 62 >59 mL/min/1.73   GFR calc Af Amer 72 >59 mL/min/1.73   BUN/Creatinine Ratio 22 (H) 9 - 20   Sodium 141 134 - 144 mmol/L   Potassium 4.5 3.5 - 5.2 mmol/L   Chloride 101 96 - 106 mmol/L   CO2 26 18 - 29 mmol/L   Calcium 10.2 8.7 - 10.2 mg/dL  PSA  Result Value Ref Range   Prostate Specific Ag, Serum 1.2 0.0 - 4.0 ng/mL   Very slight elevation of creatinine could signify very early and very mild impact on the kidney function.when this occurs, i generlly ask patients to discuss their lithium dosage with their mental health provider, and then i recommend we track this for sure yearly

## 2016-04-30 NOTE — Progress Notes (Signed)
   Subjective:    Patient ID: Vikki PortsMichael People, male    DOB: 1966-10-11, 50 y.o.   MRN: [id number]  HPI Patient arrives to discuss recent lab results. Results for orders placed or performed in visit on 04/19/16  Lipid panel  Result Value Ref Range   Cholesterol, Total 194 100 - 199 mg/dL   Triglycerides 308185 (H) 0 - 149 mg/dL   HDL 44 >65>39 mg/dL   VLDL Cholesterol Cal 37 5 - 40 mg/dL   LDL Calculated 784113 (H) 0 - 99 mg/dL   Chol/HDL Ratio 4.4 0.0 - 5.0 ratio units  Hepatic function panel  Result Value Ref Range   Total Protein 7.5 6.0 - 8.5 g/dL   Albumin 4.5 3.5 - 5.5 g/dL   Bilirubin Total 0.4 0.0 - 1.2 mg/dL   Bilirubin, Direct 6.960.13 0.00 - 0.40 mg/dL   Alkaline Phosphatase 105 39 - 117 IU/L   AST 23 0 - 40 IU/L   ALT 36 0 - 44 IU/L  Basic metabolic panel  Result Value Ref Range   Glucose 88 65 - 99 mg/dL   BUN 29 (H) 6 - 24 mg/dL   Creatinine, Ser 2.951.32 (H) 0.76 - 1.27 mg/dL   GFR calc non Af Amer 62 >59 mL/min/1.73   GFR calc Af Amer 72 >59 mL/min/1.73   BUN/Creatinine Ratio 22 (H) 9 - 20   Sodium 141 134 - 144 mmol/L   Potassium 4.5 3.5 - 5.2 mmol/L   Chloride 101 96 - 106 mmol/L   CO2 26 18 - 29 mmol/L   Calcium 10.2 8.7 - 10.2 mg/dL  PSA  Result Value Ref Range   Prostate Specific Ag, Serum 1.2 0.0 - 4.0 ng/mL     Review of Systems No headache, no major weight loss or weight gain, no chest pain no back pain abdominal pain no change in bowel habits complete ROS otherwise negative     Objective:   Physical Exam  Alert blood pressure good on repeat vital stable lungs clear heart regular in rhythm.      Assessment & Plan:  Impression blood work results described at great length. Sugar excellent. Liver enzymes perfect. Prostate tests nice and low. Lipid levels suboptimum though still overall good. Discuss along with potential intervention. Patient's creatinine is risen. 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 Of all  his risk factors I think chronic lithium use his something needs to be considered. Advise he takes his blood work with discussed with the psychiatrist about alternatives has been on lithium for long time. We will track this yearly

## 2016-05-02 ENCOUNTER — Encounter: Payer: Self-pay | Admitting: Family Medicine

## 2016-05-02 DIAGNOSIS — R7989 Other specified abnormal findings of blood chemistry: Secondary | ICD-10-CM | POA: Insufficient documentation

## 2016-05-06 ENCOUNTER — Other Ambulatory Visit: Payer: Self-pay | Admitting: *Deleted

## 2016-05-06 MED ORDER — FLUTICASONE PROPIONATE 50 MCG/ACT NA SUSP: 2.0000 | Freq: Every day | NASAL | Status: DC

## 2016-07-03 DIAGNOSIS — R519 Headache, unspecified: Secondary | ICD-10-CM | POA: Insufficient documentation

## 2016-07-09 ENCOUNTER — Other Ambulatory Visit: Payer: Self-pay | Admitting: *Deleted

## 2016-07-09 MED ORDER — PANTOPRAZOLE SODIUM 40 MG PO TBEC: DELAYED_RELEASE_TABLET | ORAL | 5 refills | Status: DC

## 2016-08-15 ENCOUNTER — Ambulatory Visit: Payer: BC Managed Care – PPO | Admitting: Family Medicine

## 2016-08-16 ENCOUNTER — Encounter: Payer: Self-pay | Admitting: Family Medicine

## 2016-08-16 ENCOUNTER — Ambulatory Visit (INDEPENDENT_AMBULATORY_CARE_PROVIDER_SITE_OTHER): Payer: BC Managed Care – PPO | Admitting: Family Medicine

## 2016-08-16 VITALS — BP 130/76 | Temp 98.8°F | Wt 175.2 lb

## 2016-08-16 DIAGNOSIS — J4541 Moderate persistent asthma with (acute) exacerbation: Secondary | ICD-10-CM | POA: Diagnosis not present

## 2016-08-16 MED ORDER — PREDNISONE 20 MG PO TABS: ORAL_TABLET | ORAL | 0 refills | Status: DC

## 2016-08-16 NOTE — Progress Notes (Signed)
   Subjective:    Patient ID: Erik Ellis, male    DOB: Mar 04, 1966, 50 y.o.   MRN: [id number]  Cough  This is a new problem. The current episode started 1 to 4 weeks ago. The cough is non-productive. Associated symptoms include shortness of breath and wheezing.   Patient states no other concerns this visit  Started around one mo ago, throat had some discomfort, had a tickling type cough. Progressively worse, not partic productive  Using advair faithfully  Using albuterol   Review of Systems  Respiratory: Positive for cough, shortness of breath and wheezing.        Objective:   Physical Exam Alert is alert vitals stable HEENT slight nasal congestion. Pharynx normal neck supple. Lungs bilateral wheezes no tachypnea no crackles occasional cough during exam heart regular in rhythm       Assessment & Plan:  Impression exacerbation asthma generally stable. Last exacerbation 6 months ago plan prednisone taper. Maintain all meds. Continue Ventolin 2 sprays every 4 6 when necessary

## 2016-11-11 ENCOUNTER — Other Ambulatory Visit: Payer: Self-pay | Admitting: *Deleted

## 2016-11-11 MED ORDER — FLUTICASONE PROPIONATE 50 MCG/ACT NA SUSP
2.0000 | Freq: Every day | NASAL | 1 refills | Status: DC
Start: 2016-11-11 — End: 2016-11-13

## 2016-11-11 MED ORDER — MONTELUKAST SODIUM 10 MG PO TABS: ORAL_TABLET | ORAL | 1 refills | Status: DC

## 2016-11-13 ENCOUNTER — Other Ambulatory Visit: Payer: Self-pay | Admitting: *Deleted

## 2016-11-13 MED ORDER — FLUTICASONE PROPIONATE 50 MCG/ACT NA SUSP: 2.0000 | Freq: Every day | NASAL | 0 refills | Status: DC

## 2017-01-04 ENCOUNTER — Other Ambulatory Visit: Payer: Self-pay | Admitting: Family Medicine

## 2017-03-03 ENCOUNTER — Other Ambulatory Visit: Payer: Self-pay | Admitting: Family Medicine

## 2017-03-03 ENCOUNTER — Ambulatory Visit (INDEPENDENT_AMBULATORY_CARE_PROVIDER_SITE_OTHER): Payer: BC Managed Care – PPO | Admitting: Family Medicine

## 2017-03-03 ENCOUNTER — Encounter: Payer: Self-pay | Admitting: Family Medicine

## 2017-03-03 VITALS — BP 120/80 | Ht 68.0 in | Wt 178.8 lb

## 2017-03-03 DIAGNOSIS — J4541 Moderate persistent asthma with (acute) exacerbation: Secondary | ICD-10-CM

## 2017-03-03 MED ORDER — ALBUTEROL SULFATE HFA 108 (90 BASE) MCG/ACT IN AERS: 2.0000 | INHALATION_SPRAY | RESPIRATORY_TRACT | 11 refills | Status: DC | PRN

## 2017-03-03 NOTE — Progress Notes (Signed)
   Subjective:    Patient ID: Erik Ellis, male    DOB: 08/02/1966, 51 y.o.   MRN: [id number]  HPI  Patient arrives for surgical clearance for upcoming partial knee replacement.  Reflux. Generally stable. Uses Protonix regularly. This helps reflux and also helps control asthma.  AsthmaPatient states asthma very stable at this time. Uses his Advair faithfully. Rare need for albuterol. No recent flares of asthma.  No recent chest pain no recent shortness of breath no recent hospitalization.   Followed elsewhere for mental health issues patient reports stable Review of Systems No headache, no major weight loss or weight gain, no chest pain no back pain abdominal pain no change in bowel habits complete ROS otherwise negative     Objective:   Physical Exam  Alert vitals stable, NAD. Blood pressure good on repeat. HEENT normal. Lungs clear. Heart regular rate and rhythm.       Assessment & Plan:  Impression 1 chronic asthma moderate persistent clinically stable clinically stable on current medications to continue same #2 reflux clinically stable current medicines to continue same plan surgical clearance. At this time patient's asthma is very stable. No other symptoms or concerns which would preclude pressing on with knee replacement. Form filled out. Medications refilled. Diet exercise discussed. WSL

## 2017-03-04 ENCOUNTER — Telehealth: Payer: Self-pay | Admitting: Family Medicine

## 2017-03-04 NOTE — Telephone Encounter (Signed)
Delbert Harness faxed over a pre-operative clearance needing to be completed.

## 2017-03-05 NOTE — Telephone Encounter (Signed)
I did yest

## 2017-03-06 ENCOUNTER — Telehealth: Payer: Self-pay | Admitting: Family Medicine

## 2017-03-06 NOTE — Telephone Encounter (Signed)
[facility] needs recent office notes with clearance form waiting on dictation. Are office notes ready yet to send with form?

## 2017-03-07 ENCOUNTER — Encounter: Payer: Self-pay | Admitting: Family Medicine

## 2017-03-07 NOTE — Telephone Encounter (Signed)
Needs note today- surgeons office has called multiple tiimes

## 2017-03-07 NOTE — Telephone Encounter (Signed)
done

## 2017-03-10 ENCOUNTER — Ambulatory Visit: Payer: BC Managed Care – PPO | Admitting: Family Medicine

## 2017-04-14 ENCOUNTER — Other Ambulatory Visit: Payer: Self-pay | Admitting: Family Medicine

## 2017-04-30 ENCOUNTER — Telehealth: Payer: Self-pay | Admitting: Family Medicine

## 2017-04-30 DIAGNOSIS — Z0000 Encounter for general adult medical examination without abnormal findings: Secondary | ICD-10-CM

## 2017-04-30 NOTE — Telephone Encounter (Signed)
Spoke with patient and informed him per Dr.Steve Luking- labs have been ordered. Needs to be fasting prior to having labs drawn. Patient verbalized understanding.

## 2017-04-30 NOTE — Telephone Encounter (Signed)
Patient needing labs for physical on 7/13.

## 2017-04-30 NOTE — Telephone Encounter (Signed)
LIP LIV M7 PSAy

## 2017-05-06 LAB — BASIC METABOLIC PANEL
BUN / CREAT RATIO: 15 (ref 9–20)
BUN: 19 mg/dL (ref 6–24)
CALCIUM: 9.7 mg/dL (ref 8.7–10.2)
CHLORIDE: 103 mmol/L (ref 96–106)
CO2: 25 mmol/L (ref 20–29)
Creatinine, Ser: 1.24 mg/dL (ref 0.76–1.27)
GFR, EST AFRICAN AMERICAN: 76 mL/min/{1.73_m2} (ref >59)
GFR, EST NON AFRICAN AMERICAN: 66 mL/min/{1.73_m2} (ref >59)
Glucose: 92 mg/dL (ref 65–99)
POTASSIUM: 4.9 mmol/L (ref 3.5–5.2)
Sodium: 142 mmol/L (ref 134–144)

## 2017-05-06 LAB — HEPATIC FUNCTION PANEL
ALBUMIN: 4.1 g/dL (ref 3.5–5.5)
ALK PHOS: 109 IU/L (ref 39–117)
ALT: 19 IU/L (ref 0–44)
AST: 14 IU/L (ref 0–40)
BILIRUBIN TOTAL: 0.4 mg/dL (ref 0.0–1.2)
BILIRUBIN, DIRECT: 0.1 mg/dL (ref 0.00–0.40)
Total Protein: 6.7 g/dL (ref 6.0–8.5)

## 2017-05-06 LAB — LIPID PANEL
CHOLESTEROL TOTAL: 191 mg/dL (ref 100–199)
Chol/HDL Ratio: 4 ratio (ref 0.0–5.0)
HDL: 48 mg/dL (ref >39)
LDL CALC: 121 mg/dL — AB (ref 0–99)
Triglycerides: 110 mg/dL (ref 0–149)
VLDL CHOLESTEROL CAL: 22 mg/dL (ref 5–40)

## 2017-05-06 LAB — PSA: Prostate Specific Ag, Serum: 1 ng/mL (ref 0.0–4.0)

## 2017-05-11 ENCOUNTER — Other Ambulatory Visit: Payer: Self-pay | Admitting: Family Medicine

## 2017-05-16 ENCOUNTER — Ambulatory Visit (INDEPENDENT_AMBULATORY_CARE_PROVIDER_SITE_OTHER): Payer: BC Managed Care – PPO | Admitting: Family Medicine

## 2017-05-16 ENCOUNTER — Encounter: Payer: Self-pay | Admitting: Family Medicine

## 2017-05-16 VITALS — BP 132/84 | Ht 68.0 in | Wt 173.0 lb

## 2017-05-16 DIAGNOSIS — Z0000 Encounter for general adult medical examination without abnormal findings: Secondary | ICD-10-CM

## 2017-05-16 MED ORDER — FLUTICASONE-SALMETEROL 115-21 MCG/ACT IN AERO: INHALATION_SPRAY | RESPIRATORY_TRACT | 11 refills | Status: DC

## 2017-05-16 MED ORDER — MONTELUKAST SODIUM 10 MG PO TABS: ORAL_TABLET | ORAL | 3 refills | Status: DC

## 2017-05-16 MED ORDER — PANTOPRAZOLE SODIUM 40 MG PO TBEC: 40.0000 mg | DELAYED_RELEASE_TABLET | Freq: Every day | ORAL | 11 refills | Status: DC

## 2017-05-16 MED ORDER — FLUTICASONE PROPIONATE 50 MCG/ACT NA SUSP: 2.0000 | Freq: Every day | NASAL | 11 refills | Status: DC

## 2017-05-16 MED ORDER — ALBUTEROL SULFATE HFA 108 (90 BASE) MCG/ACT IN AERS: 2.0000 | INHALATION_SPRAY | RESPIRATORY_TRACT | 11 refills | Status: DC | PRN

## 2017-05-16 NOTE — Progress Notes (Signed)
Subjective:    Patient ID: Erik Ellis, male    DOB: 09-Aug-1966, 51 y.o.   MRN: [id number]  HPI The patient comes in today for a wellness visit.    A review of their health history was completed.  A review of medications was also completed.  Any needed refills; advair  Eating habits: health conscious  Falls/  MVA accidents in past few months: none  Regular exercise: yes bike, running ( not yet due to knee surgery)  Specialist pt sees on regular basis: dr. Mayford Knife ( [facility]) chemical inbalance  Preventative health issues were discussed.   Additional concerns: hands shaking, recent weeks hands have been more shaky, cut the grass, felt some tremor after exercise, and when exerting, No fam history of trimor    unable to fall asleep for the last 2 weeks. , last few weeks trouble sleeping, awoke early before, pt taking the ambien, No daytime naps or drowsiness during the day, avoids naps purposefully  Working on physical therapy post  Results for orders placed or performed in visit on 04/30/17  Lipid panel  Result Value Ref Range   Cholesterol, Total 191 100 - 199 mg/dL   Triglycerides 409 0 - 149 mg/dL   HDL 48 >81 mg/dL   VLDL Cholesterol Cal 22 5 - 40 mg/dL   LDL Calculated 191 (H) 0 - 99 mg/dL   Chol/HDL Ratio 4.0 0.0 - 5.0 ratio  Hepatic function panel  Result Value Ref Range   Total Protein 6.7 6.0 - 8.5 g/dL   Albumin 4.1 3.5 - 5.5 g/dL   Bilirubin Total 0.4 0.0 - 1.2 mg/dL   Bilirubin, Direct 4.78 0.00 - 0.40 mg/dL   Alkaline Phosphatase 109 39 - 117 IU/L   AST 14 0 - 40 IU/L   ALT 19 0 - 44 IU/L  Basic metabolic panel  Result Value Ref Range   Glucose 92 65 - 99 mg/dL   BUN 19 6 - 24 mg/dL   Creatinine, Ser 2.95 0.76 - 1.27 mg/dL   GFR calc non Af Amer 66 >59 mL/min/1.73   GFR calc Af Amer 76 >59 mL/min/1.73   BUN/Creatinine Ratio 15 9 - 20   Sodium 142 134 - 144 mmol/L   Potassium 4.9 3.5 - 5.2 mmol/L   Chloride 103 96 - 106 mmol/L   CO2 25  20 - 29 mmol/L   Calcium 9.7 8.7 - 10.2 mg/dL  PSA  Result Value Ref Range   Prostate Specific Ag, Serum 1.0 0.0 - 4.0 ng/mL     Review of Systems  Constitutional: Negative for activity change, appetite change and fever.  HENT: Negative for congestion and rhinorrhea.   Eyes: Negative for discharge.  Respiratory: Negative for cough and wheezing.   Cardiovascular: Negative for chest pain.  Gastrointestinal: Negative for abdominal pain, blood in stool and vomiting.  Genitourinary: Negative for difficulty urinating and frequency.  Musculoskeletal: Negative for neck pain.  Skin: Negative for rash.  Allergic/Immunologic: Negative for environmental allergies and food allergies.  Neurological: Negative for weakness and headaches.  Psychiatric/Behavioral: Negative for agitation.  All other systems reviewed and are negative.      Objective:   Physical Exam  Constitutional: He appears well-developed and well-nourished.  HENT:  Head: Normocephalic and atraumatic.  Right Ear: External ear normal.  Left Ear: External ear normal.  Nose: Nose normal.  Mouth/Throat: Oropharynx is clear and moist.  Eyes: Pupils are equal, round, and reactive to light. EOM are normal.  Neck:  Normal range of motion. Neck supple. No thyromegaly present.  Cardiovascular: Normal rate, regular rhythm and normal heart sounds.   No murmur heard. Pulmonary/Chest: Effort normal and breath sounds normal. No respiratory distress. He has no wheezes.  Abdominal: Soft. Bowel sounds are normal. He exhibits no distension and no mass. There is no tenderness.  Genitourinary: Penis normal.  Musculoskeletal: Normal range of motion. He exhibits no edema.  Lymphadenopathy:    He has no cervical adenopathy.  Neurological: He is alert. He exhibits normal muscle tone.  Skin: Skin is warm and dry. No erythema.  Psychiatric: He has a normal mood and affect. His behavior is normal. Judgment normal.  Vitals reviewed.           Assessment & Plan:  Impression well adult exam. Diet discussed exercise discussed patient does have mild tremor likely essential versus accentuation from lithium discussed. Up-to-date on colonoscopy.

## 2017-06-06 ENCOUNTER — Encounter: Payer: Self-pay | Admitting: Nurse Practitioner

## 2017-06-06 ENCOUNTER — Ambulatory Visit (INDEPENDENT_AMBULATORY_CARE_PROVIDER_SITE_OTHER): Payer: BC Managed Care – PPO | Admitting: Nurse Practitioner

## 2017-06-06 VITALS — BP 128/74 | Temp 98.2°F | Ht 68.0 in | Wt 176.1 lb

## 2017-06-06 DIAGNOSIS — J4521 Mild intermittent asthma with (acute) exacerbation: Secondary | ICD-10-CM

## 2017-06-06 DIAGNOSIS — J310 Chronic rhinitis: Secondary | ICD-10-CM

## 2017-06-06 MED ORDER — PREDNISONE 20 MG PO TABS: ORAL_TABLET | ORAL | 0 refills | Status: DC

## 2017-06-06 NOTE — Progress Notes (Signed)
Subjective:  Presents for complaints of an exacerbation of his asthma for the past week. Began after doing some yard work. Takes his Advair daily. Using his albuterol about twice a day for the past few days. Has not used it today. Also taking Singulair. Taking Protonix, no reflux symptoms. Cough has now become slightly productive with clear mucus. PND. No fever. Slight sore throat. No ear pain or headache. Cough worse with activity or prolonged talking.  Objective:   BP 128/74   Temp 98.2 F (36.8 C) (Oral)   Ht 5\' 8"  (1.727 m)   Wt 176 lb 0.8 oz (79.9 kg)   BMI 26.77 kg/m  NAD. Alert, oriented. TMs retracted bilateral, worse on the left, no erythema. Posterior pharynx mildly erythematous with cloudy PND noted. Neck supple with mild soft anterior adenopathy. Lungs 1 faint expiratory wheeze noted left anterior upper lobe, otherwise clear. No tachypnea. Normal color. Heart regular rate rhythm. Abdomen soft nontender.  Assessment:   Problem List Items Addressed This Visit      Respiratory   Asthma with acute exacerbation - Primary   Relevant Medications   predniSONE (DELTASONE) 20 MG tablet    Other Visit Diagnoses    Mixed rhinitis           Plan:   Meds ordered this encounter  Medications  . predniSONE (DELTASONE) 20 MG tablet    Sig: 3 po qd x 3 d then 2 po qd x 3 d then 1 po qd x 2 d    Dispense:  17 tablet    Refill:  0    Order Specific Question:   Supervising Provider    Answer:   Merlyn AlbertLUKING, WILLIAM S [[id number]]   Continue other medications as directed. No indication for antibiotics at this time. Call back in 3-4 days if no improvement in symptoms, call or go to ED sooner if worse.

## 2017-07-04 ENCOUNTER — Telehealth: Payer: Self-pay | Admitting: Family Medicine

## 2017-07-04 ENCOUNTER — Other Ambulatory Visit: Payer: Self-pay | Admitting: Nurse Practitioner

## 2017-07-04 MED ORDER — PREDNISONE 20 MG PO TABS: ORAL_TABLET | ORAL | 0 refills | Status: DC

## 2017-07-04 NOTE — Telephone Encounter (Signed)
Sent in. Office visit next week if no better.  

## 2017-07-04 NOTE — Telephone Encounter (Signed)
Patient would like a refill on prednisone  [facility] into [facility] [address] [place] was seen 8/3 by Eber Jonesarolyn with asthma symptoms not quite clear up yet.

## 2017-07-08 NOTE — Telephone Encounter (Signed)
Left message return call 07/08/17

## 2017-07-08 NOTE — Telephone Encounter (Signed)
Discussed with pt

## 2017-08-14 ENCOUNTER — Ambulatory Visit (INDEPENDENT_AMBULATORY_CARE_PROVIDER_SITE_OTHER): Payer: BC Managed Care – PPO | Admitting: Family Medicine

## 2017-08-15 ENCOUNTER — Encounter: Payer: Self-pay | Admitting: Family Medicine

## 2017-08-15 ENCOUNTER — Ambulatory Visit (INDEPENDENT_AMBULATORY_CARE_PROVIDER_SITE_OTHER): Payer: BC Managed Care – PPO | Admitting: Family Medicine

## 2017-08-15 VITALS — BP 128/82 | Temp 98.5°F | Wt 171.4 lb

## 2017-08-15 DIAGNOSIS — S39011A Strain of muscle, fascia and tendon of abdomen, initial encounter: Secondary | ICD-10-CM | POA: Diagnosis not present

## 2017-08-15 NOTE — Progress Notes (Signed)
   Subjective:    Patient ID: Erik Ellis, male    DOB: November 06, 1965, 51 y.o.   MRN: [id number]  Abdominal Pain  This is a new problem. The current episode started 1 to 4 weeks ago. The pain is located in the LLQ and RLQ. Nothing aggravates the pain. He has tried nothing for the symptoms.   Patient states no other concerns this visit.   Right groin pain,  Worse I the nrb''  Going on for aout a month  Pain sharp at times, aching at other times, Generally worse with movement or motion.    No hernina  No hx injuryno overdoing it as far as pt knows    Review of Systems  Gastrointestinal: Positive for abdominal pain.       Objective:   Physical Exam  Alert vitals stable, NAD. Blood pressure good on repeat. HEENT normal. Lungs clear. Heart regular rate and rhythm. Abdomen right lower abdominal tenderness near inguinal fold negative scrotal exam for hernia. Bowel sounds excellent no discrete abdominal tenderness.      Assessment & Plan:  Impression subacute abdominal strain plan trial of anti-inflammatory medicine local measures discussed warning signs discussed

## 2018-04-13 ENCOUNTER — Ambulatory Visit: Payer: BC Managed Care – PPO | Admitting: Family Medicine

## 2018-04-13 ENCOUNTER — Encounter: Payer: Self-pay | Admitting: Family Medicine

## 2018-04-13 VITALS — BP 130/76 | Ht 68.0 in | Wt 174.0 lb

## 2018-04-13 DIAGNOSIS — R5383 Other fatigue: Secondary | ICD-10-CM | POA: Diagnosis not present

## 2018-04-13 DIAGNOSIS — Z125 Encounter for screening for malignant neoplasm of prostate: Secondary | ICD-10-CM

## 2018-04-13 DIAGNOSIS — Z13220 Encounter for screening for lipoid disorders: Secondary | ICD-10-CM | POA: Diagnosis not present

## 2018-04-13 DIAGNOSIS — Z79899 Other long term (current) drug therapy: Secondary | ICD-10-CM | POA: Diagnosis not present

## 2018-04-13 DIAGNOSIS — J4521 Mild intermittent asthma with (acute) exacerbation: Secondary | ICD-10-CM

## 2018-04-13 LAB — POCT HEMOGLOBIN: Hemoglobin: 13.5 g/dL — AB (ref 14.1–18.1)

## 2018-04-13 NOTE — Progress Notes (Signed)
  Subjective:     Patient ID: Erik Ellis, male   DOB: Jul 21, 1966, 52 y.o.   MRN: [id number]  HPI Patient is here today due to having fatigue.More so lately. He states ongoing since April 2018.No shortness of breath.   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   Normally by end of school yr nearly everyone is tired  Bipolar overall stable with things, managing his highs and lows ok,  astma in good control,,, less wheezing attakck recent yrs  Before the leg injury ws running three times per wk   Not really exercising at this time   Review of Systems No headache, no major weight loss or weight gain, no chest pain no back pain abdominal pain no change in bowel habits complete ROS otherwise negative     Results for orders placed or performed in visit on 04/13/18  POCT hemoglobin  Result Value Ref Range   Hemoglobin 13.5 (A) 14.1 - 18.1 g/dL    Objective:   Physical Exam Alert and oriented, vitals reviewed and stable, NAD ENT-TM's and ext canals WNL bilat via otoscopic exam Soft palate, tonsils and post pharynx WNL via oropharyngeal exam Neck-symmetric, no masses; thyroid nonpalpable and nontender Pulmonary-no tachypnea or accessory muscle use; Clear without wheezes via auscultation Card--no abnrml murmurs, rhythm reg and rate WNL Carotid pulses symmetric, without bruits     Assessment:     Impression substantial fatigue.  Protracted.  Since hospitalization for orthopedic surgery.  Reports also challenged by the end of school year  issues.  Also challenged by not exercising.  Symptomatology from bipolar intermittent.  Long discussion held.  We will press on and do some blood testing.  If all negative will strongly encourage patient to start exercising regularly again and follow-up with mental health provider as needed  Greater than 50% of this 25 minute face to face visit was spent in counseling and discussion and coordination of care regarding the above diagnosis/diagnosies     Plan:

## 2018-04-15 LAB — CBC WITH DIFFERENTIAL/PLATELET
BASOS ABS: 0 10*3/uL (ref 0.0–0.2)
Basos: 0 %
EOS (ABSOLUTE): 0.1 10*3/uL (ref 0.0–0.4)
Eos: 2 %
HEMATOCRIT: 43 % (ref 37.5–51.0)
HEMOGLOBIN: 14.4 g/dL (ref 13.0–17.7)
Immature Grans (Abs): 0 10*3/uL (ref 0.0–0.1)
Immature Granulocytes: 0 %
LYMPHS ABS: 1 10*3/uL (ref 0.7–3.1)
Lymphs: 16 %
MCH: 31.2 pg (ref 26.6–33.0)
MCHC: 33.5 g/dL (ref 31.5–35.7)
MCV: 93 fL (ref 79–97)
MONOCYTES: 5 %
Monocytes Absolute: 0.3 10*3/uL (ref 0.1–0.9)
NEUTROS ABS: 4.6 10*3/uL (ref 1.4–7.0)
Neutrophils: 77 %
Platelets: 275 10*3/uL (ref 150–450)
RBC: 4.62 x10E6/uL (ref 4.14–5.80)
RDW: 13.1 % (ref 12.3–15.4)
WBC: 5.9 10*3/uL (ref 3.4–10.8)

## 2018-04-15 LAB — LIPID PANEL
Chol/HDL Ratio: 4.3 ratio (ref 0.0–5.0)
Cholesterol, Total: 213 mg/dL — ABNORMAL HIGH (ref 100–199)
HDL: 50 mg/dL (ref >39)
LDL CALC: 140 mg/dL — AB (ref 0–99)
Triglycerides: 115 mg/dL (ref 0–149)
VLDL Cholesterol Cal: 23 mg/dL (ref 5–40)

## 2018-04-15 LAB — BASIC METABOLIC PANEL
BUN / CREAT RATIO: 16 (ref 9–20)
BUN: 19 mg/dL (ref 6–24)
CO2: 27 mmol/L (ref 20–29)
CREATININE: 1.18 mg/dL (ref 0.76–1.27)
Calcium: 10.3 mg/dL — ABNORMAL HIGH (ref 8.7–10.2)
Chloride: 102 mmol/L (ref 96–106)
GFR calc Af Amer: 81 mL/min/{1.73_m2} (ref >59)
GFR, EST NON AFRICAN AMERICAN: 70 mL/min/{1.73_m2} (ref >59)
Glucose: 101 mg/dL — ABNORMAL HIGH (ref 65–99)
Potassium: 4.6 mmol/L (ref 3.5–5.2)
SODIUM: 142 mmol/L (ref 134–144)

## 2018-04-15 LAB — HEPATIC FUNCTION PANEL
ALT: 23 IU/L (ref 0–44)
AST: 17 IU/L (ref 0–40)
Albumin: 4.6 g/dL (ref 3.5–5.5)
Alkaline Phosphatase: 107 IU/L (ref 39–117)
BILIRUBIN TOTAL: 0.4 mg/dL (ref 0.0–1.2)
Bilirubin, Direct: 0.09 mg/dL (ref 0.00–0.40)
Total Protein: 7.1 g/dL (ref 6.0–8.5)

## 2018-04-15 LAB — PSA: Prostate Specific Ag, Serum: 2.3 ng/mL (ref 0.0–4.0)

## 2018-04-15 LAB — TSH: TSH: 1.1 u[IU]/mL (ref 0.450–4.500)

## 2018-05-08 ENCOUNTER — Ambulatory Visit (INDEPENDENT_AMBULATORY_CARE_PROVIDER_SITE_OTHER): Payer: BC Managed Care – PPO | Admitting: Family Medicine

## 2018-05-08 ENCOUNTER — Encounter: Payer: Self-pay | Admitting: Family Medicine

## 2018-05-08 VITALS — BP 124/80 | HR 77 | Temp 97.7°F | Ht 68.0 in | Wt 176.0 lb

## 2018-05-08 DIAGNOSIS — J4521 Mild intermittent asthma with (acute) exacerbation: Secondary | ICD-10-CM | POA: Diagnosis not present

## 2018-05-08 DIAGNOSIS — J189 Pneumonia, unspecified organism: Secondary | ICD-10-CM

## 2018-05-08 MED ORDER — FLUTICASONE PROPIONATE 50 MCG/ACT NA SUSP: 2.0000 | Freq: Every day | NASAL | 11 refills | Status: DC

## 2018-05-08 MED ORDER — AZITHROMYCIN 250 MG PO TABS
ORAL_TABLET | ORAL | 0 refills | Status: DC
Start: 2018-05-08 — End: 2018-09-30

## 2018-05-08 MED ORDER — PREDNISONE 20 MG PO TABS: ORAL_TABLET | ORAL | 0 refills | Status: DC

## 2018-05-08 NOTE — Progress Notes (Signed)
   Subjective:    Patient ID: Erik Ellis, male    DOB: 21-May-1966, 52 y.o.   MRN: [id number]  Asthma  He complains of cough and shortness of breath. Episode onset: 2 weeks ago. His past medical history is significant for asthma.     Two weeks ago noted tickle cough and drange  Progressed  To more difficulty  Not sure if pollen or air quality,  Using rescue inhaler quite a bit more, two or the ties per day,   Wheezing non prod dry in nature, cough    Using chronic meds faithfully, ran out of the flonase and has not taken    Some night time cough , when lays down worse     Review of Systems  Respiratory: Positive for cough and shortness of breath.        Objective:   Physical Exam Alert active good hydration.  HEENT normal impression normal lungs respiratory left basilar crackles.  No tachypnea positive expiratory wheezes diffusely heart regular rate and rhythm       Assessment & Plan:  Impression exacerbation of asthma with clinical equivalent of walking pneumonia.  Discussed.  Will initiate antibiotics and also prednisone taper.  Patient compliant with chronic meds and this is first flare since last some

## 2018-05-27 ENCOUNTER — Other Ambulatory Visit: Payer: Self-pay | Admitting: Family Medicine

## 2018-06-06 ENCOUNTER — Other Ambulatory Visit: Payer: Self-pay | Admitting: Family Medicine

## 2018-07-07 ENCOUNTER — Other Ambulatory Visit: Payer: Self-pay | Admitting: Family Medicine

## 2018-08-06 ENCOUNTER — Encounter: Payer: Self-pay | Admitting: Family Medicine

## 2018-08-06 ENCOUNTER — Ambulatory Visit: Payer: BC Managed Care – PPO | Admitting: Family Medicine

## 2018-08-06 VITALS — BP 148/82 | HR 77 | Temp 98.1°F | Ht 68.0 in | Wt 169.0 lb

## 2018-08-06 DIAGNOSIS — J4541 Moderate persistent asthma with (acute) exacerbation: Secondary | ICD-10-CM

## 2018-08-06 MED ORDER — PREDNISONE 20 MG PO TABS: ORAL_TABLET | ORAL | 0 refills | Status: DC

## 2018-08-06 MED ORDER — FLUTICASONE-SALMETEROL 230-21 MCG/ACT IN AERO: 2.0000 | INHALATION_SPRAY | Freq: Two times a day (BID) | RESPIRATORY_TRACT | 12 refills | Status: DC

## 2018-08-06 NOTE — Progress Notes (Signed)
   Subjective:    Patient ID: Erik Ellis, male    DOB: Dec 08, 1965, 52 y.o.   MRN: [id number]  HPI Patient is here today with complaints of asthma attacks for the last week with the last few days it has been worse. He has been taking albuterol Bid and nasal spray and sigular.   Week or so agonoted progressive dry cough  Hard to breathe with eertion  Bad cough   No major allergies this fall   around  No fever no chills     Review of Systems No headache, no major weight loss or weight gain, no chest pain no back pain abdominal pain no change in bowel habits complete ROS otherwise negative     Objective:   Physical Exam  Alert active good hydration.  HEENT slight nasal congestion pharynx normal lungs bilateral wheezes.  No tachypnea no inspiratory crackles heart regular rhythm.      Assessment & Plan:  Impression exacerbation of moderate persistent asthma nontapering signs discussed.  Increase strength Advair.  If recurrence continue frequently will need asthma refill

## 2018-08-19 ENCOUNTER — Telehealth: Payer: Self-pay | Admitting: Family Medicine

## 2018-08-19 MED ORDER — PREDNISONE 20 MG PO TABS: ORAL_TABLET | ORAL | 0 refills | Status: DC

## 2018-08-19 NOTE — Telephone Encounter (Signed)
Seen 08/06/18 and advair dosage was increased and was given prednisone 20mg  and got better but after finishing started sob and wheezing. 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 Pt states he sometimes needs two rounds of prednisone.

## 2018-08-19 NOTE — Telephone Encounter (Signed)
Patient is requesting refill on prednisone 20 mg. Still having asthma flare ups [facility]

## 2018-08-19 NOTE — Telephone Encounter (Signed)
Ok another adult pred taper

## 2018-08-19 NOTE — Telephone Encounter (Signed)
Medication sent to requested pharmacy. Left a message to r/c.

## 2018-08-19 NOTE — Telephone Encounter (Signed)
Med sent to pharm. Pt notified.  

## 2018-09-01 ENCOUNTER — Encounter: Payer: Self-pay | Admitting: Family Medicine

## 2018-09-01 ENCOUNTER — Ambulatory Visit: Payer: BC Managed Care – PPO | Admitting: Family Medicine

## 2018-09-01 ENCOUNTER — Ambulatory Visit (HOSPITAL_COMMUNITY)
Admission: RE | Admit: 2018-09-01 | Discharge: 2018-09-01 | Disposition: A | Discharge status: Home / Self Care | Payer: BC Managed Care – PPO | Source: Ambulatory Visit | Attending: Family Medicine | Admitting: Family Medicine

## 2018-09-01 VITALS — BP 132/82 | HR 64 | Temp 98.1°F | Ht 68.0 in | Wt 170.0 lb

## 2018-09-01 DIAGNOSIS — R059 Cough, unspecified: Secondary | ICD-10-CM

## 2018-09-01 DIAGNOSIS — R05 Cough: Secondary | ICD-10-CM | POA: Insufficient documentation

## 2018-09-01 DIAGNOSIS — J4541 Moderate persistent asthma with (acute) exacerbation: Secondary | ICD-10-CM

## 2018-09-01 MED ORDER — PREDNISONE 10 MG PO TABS: ORAL_TABLET | ORAL | 0 refills | Status: DC

## 2018-09-01 MED ORDER — CLARITHROMYCIN 500 MG PO TABS: 500.0000 mg | ORAL_TABLET | Freq: Two times a day (BID) | ORAL | 0 refills | Status: AC

## 2018-09-01 NOTE — Progress Notes (Signed)
   Subjective:    Patient ID: Erik Ellis, male    DOB: 09-Mar-1966, 52 y.o.   MRN: [id number]  HPI Patient is here today to follow up on asthma.He states he has had two rounds of prednisone and it feels as it is getting worse.   Now sticking with the advair and singulair faithfully   Ran out of the pred taper about gfive d ago    99 oxygen sat  Continues to have coughing and wheezing.  Coughing day and night.  Some exertional dyspnea.  Complaint claims complete compliance with stronger dose Advair and Singulair.  Review of Systems No headache, no major weight loss or weight gain, no chest pain no back pain abdominal pain no change in bowel habits complete ROS otherwise negative     Objective:   Physical Exam   Alert vitals stable, NAD. Blood pressure good on repeat. HEENT normal. Lungs very mild expiratory wheezes otherwise clear. Heart regular rate and rhythm.      Assessment & Plan:  Impression recurrent exacerbation of asthma with persistent symptomatology despite increasing Advair and maintain Singulair.  Another prednisone taper.  Antibiotics prescribed.  Chest x-ray.  Referral to asthma specialist rationale discussed with patient  Further recommendations for patient based on chest x-ray. Addendum x-ray negative Warning signs discussed.

## 2018-09-02 ENCOUNTER — Encounter: Payer: Self-pay | Admitting: Family Medicine

## 2018-09-30 ENCOUNTER — Ambulatory Visit (INDEPENDENT_AMBULATORY_CARE_PROVIDER_SITE_OTHER): Payer: BC Managed Care – PPO | Admitting: Allergy & Immunology

## 2018-09-30 ENCOUNTER — Encounter: Payer: Self-pay | Admitting: Allergy & Immunology

## 2018-09-30 VITALS — BP 120/82 | HR 82 | Temp 98.0°F | Resp 18 | Ht 67.0 in | Wt 171.0 lb

## 2018-09-30 DIAGNOSIS — J310 Chronic rhinitis: Secondary | ICD-10-CM

## 2018-09-30 DIAGNOSIS — J4540 Moderate persistent asthma, uncomplicated: Secondary | ICD-10-CM | POA: Diagnosis not present

## 2018-09-30 MED ORDER — BUDESONIDE-FORMOTEROL FUMARATE 160-4.5 MCG/ACT IN AERO: 2.0000 | INHALATION_SPRAY | Freq: Two times a day (BID) | RESPIRATORY_TRACT | 5 refills | Status: DC

## 2018-09-30 MED ORDER — MONTELUKAST SODIUM 10 MG PO TABS: 10.0000 mg | ORAL_TABLET | Freq: Every day | ORAL | 5 refills | Status: DC

## 2018-09-30 NOTE — Progress Notes (Signed)
NEW PATIENT  Date of Service/Encounter:  09/30/18  Referring provider: Merlyn Albert, MD   Assessment:   Moderate persistent asthma without complication  Chronic rhinitis   Mr Head is a delightful 52 year old gentleman presenting for evaluation of allergies and asthma.  His asthma seems just started when he moved to the [place] area around 2004.  Although typically he only needs prednisone around 1-2 times per year, this past fall alone he has needed three rounds.  During the 15 years of his asthma diagnosis, he has slowly been stepped up on his therapy and is now on high-dose Advair 2 puffs twice daily as well as Singulair.  He has never been on Spiriva, so we could add this on in the future.  I am going to get some labs to rule out serious causes of asthma including alpha-1 antitrypsin deficiency and ABPA.  We are also going to get some labs to see if you qualify for any of our asthma Biologics.  These could potentially be life changing for him and would allow [place] to decrease his inhaler strengths.  Unfortunately, the environmental allergy testing was not enlightening.  However, we will keep fluticasone on board as it can help with nonallergic rhinitis as well.  Obviously our goal is to get the minimum number of medications with the best symptom control.     Plan/Recommendations:   1. Moderate persistent asthma without complication - Lung testing looked great today. - We will change your Advair to Symbicort 160/4.5 two puffs twice daily. - Copay card provided for a $0 copay.  - We will get some labs to rule out serious causes of asthma.  - We will also get some labs to see if you would qualify for an asthma biologic, which can help improve your lung function and decrease your asthma exacerbations.  - Spacer sample and demonstration provided. - Daily controller medication(s): Singulair 10mg  daily and Symbicort 160/4.56mcg two puffs twice daily with spacer - Prior to  physical activity: ProAir 2 puffs 10-15 minutes before physical activity. - Rescue medications: ProAir 4 puffs every 4-6 hours as needed - Asthma control goals:  * Full participation in all desired activities (may need albuterol before activity) * Albuterol use two time or less a week on average (not counting use with activity) * Cough interfering with sleep two time or less a month * Oral steroids no more than once a year * No hospitalizations  2. Chronic rhinitis - Testing today showed: negative to the entire panel - Copy of test results provided.  - We will confirm this with labs.  - Continue with: Flonase (fluticasone) one spray per nostril daily  3. Return in about 2 months (around 11/30/2018).  Subjective:   Cassie Henkels is a 52 y.o. male presenting today for evaluation of  Chief Complaint  Patient presents with  . Cough    does have a definitive diagnosis of asthma. seems to have worsened over the last year.     Rolland Steinert has a history of the following: Patient Active Problem List   Diagnosis Date Noted  . Elevated serum creatinine 05/02/2016  . Allergic rhinitis 02/17/2016  . Asthma with acute exacerbation 05/31/2015  . Bipolar disorder (HCC) 05/31/2015  . Esophageal reflux 05/31/2015    History obtained from: chart review and patient.  Kenry Daubert was referred by Merlyn Albert, MD.     Eoghan is a 52 y.o. male presenting for an evaluation of allergies and asthma. He  is from [place] and coahces basketball. He noticed his asthmatic symptoms first when he moved down here.   Asthma/Respiratory Symptom History: He is treated with prednisone annually. THis year has been bad. He has had several asthma attacks over the last fall. He had three rounds of prednisone. The prednisone typically knocks it out, but now it is getting much worse. He is currently on Singulair at night and Advair two puffs in the morning and two puffs at night. He has been on Advair  (230/231) for years. He continues to have exacerbations even with the daily controller medications.   Allergic Rhinitis Symptom History: He is outside all of the time which exposes him to allergies. He does endorse a dry hacking cough. He is on Flonase which was added somewhere along the line, but he has not been consistent with taking it.   Otherwise, there is no history of other atopic diseases, including food allergies, drug allergies, stinging insect allergies, eczema or urticaria. There is no significant infectious history. Vaccinations are up to date.    Past Medical History: Patient Active Problem List   Diagnosis Date Noted  . Elevated serum creatinine 05/02/2016  . Allergic rhinitis 02/17/2016  . Asthma with acute exacerbation 05/31/2015  . Bipolar disorder (HCC) 05/31/2015  . Esophageal reflux 05/31/2015    Medication List:  Allergies as of 09/30/2018   No Known Allergies     Medication List        Accurate as of 09/30/18  4:16 PM. Always use your most recent med list.          albuterol 108 (90 Base) MCG/ACT inhaler Commonly known as:  PROVENTIL HFA;VENTOLIN HFA Inhale 2 puffs into the lungs every 4 (four) hours as needed. For shortness of breath/wheezing   amphetamine-dextroamphetamine 10 MG tablet Commonly known as:  ADDERALL   budesonide-formoterol 160-4.5 MCG/ACT inhaler Commonly known as:  SYMBICORT Inhale 2 puffs into the lungs 2 (two) times daily.   buPROPion 300 MG 24 hr tablet Commonly known as:  WELLBUTRIN XL Take 1 tablet by mouth daily.   busPIRone 15 MG tablet Commonly known as:  BUSPAR Take 30 mg by mouth 2 (two) times daily.   fluticasone 50 MCG/ACT nasal spray Commonly known as:  FLONASE Place 2 sprays into both nostrils daily.   fluticasone-salmeterol 230-21 MCG/ACT inhaler Commonly known as:  ADVAIR HFA Inhale 2 puffs into the lungs 2 (two) times daily.   gabapentin 300 MG capsule Commonly known as:  NEURONTIN Take 300 mg by  mouth at bedtime.   lamoTRIgine 200 MG tablet Commonly known as:  LAMICTAL Take 200 mg by mouth 2 (two) times daily.   lithium carbonate 300 MG CR tablet Commonly known as:  LITHOBID Take 1,200 mg by mouth at bedtime.   montelukast 10 MG tablet Commonly known as:  SINGULAIR Take 1 tablet (10 mg total) by mouth at bedtime.   pantoprazole 40 MG tablet Commonly known as:  PROTONIX TAKE 1 TABLET BY MOUTH EVERY DAY   sildenafil 50 MG tablet Commonly known as:  VIAGRA TAKE 1 TABLET BY MOUTH AS NEEDED AN HOUR BEFORE SEX. DO NOT TAKE MORE THAN 1 TIME IN 24 HOURS. THIS IS NOT A DAILY MEDICINE.   XIIDRA 5 % Soln Generic drug:  Lifitegrast INSTILL 1 DROP INTO BOTH EYES TWICE A DAY 2 MINUTE LID CLOSURE AFTER INSTILLING   zolpidem 10 MG tablet Commonly known as:  AMBIEN Take 5-10 mg by mouth at bedtime.  Birth History: non-contributory  Developmental History: non-contributory.   Past Surgical History: Past Surgical History:  Procedure Laterality Date  . CHOLECYSTECTOMY    . KNEE ARTHROSCOPY    . LUMBAR DISC SURGERY    . NECK SURGERY    . REPLACEMENT TOTAL KNEE       Family History: Family History  Problem Relation Age of Onset  . Asthma Father      Social History: Maciej lives at home with his wife.  He has 2 daughters and a son, all of whom are in college.  They live in a house that was built in 2004.  There is carpeting linoleum in the main living areas and carpeting in the bedrooms.  Of electric heating and central cooling.  There are no animals inside or outside of the home.  There are no dust mite covers.  There is no tobacco exposure.  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  His wife is also in education and teaches at [employer] in [place].    Review of Systems: a 14-point review of systems is pertinent for what is mentioned in HPI.  Otherwise, all other systems were negative. Constitutional: negative other than  that listed in the HPI Eyes: negative other than that listed in the HPI Ears, nose, mouth, throat, and face: negative other than that listed in the HPI Respiratory: negative other than that listed in the HPI Cardiovascular: negative other than that listed in the HPI Gastrointestinal: negative other than that listed in the HPI Genitourinary: negative other than that listed in the HPI Integument: negative other than that listed in the HPI Hematologic: negative other than that listed in the HPI Musculoskeletal: negative other than that listed in the HPI Neurological: negative other than that listed in the HPI Allergy/Immunologic: negative other than that listed in the HPI    Objective:   Blood pressure 120/82, pulse 82, temperature 98 F (36.7 C), temperature source Oral, resp. rate 18, height 5\' 7"  (1.702 m), weight 171 lb (77.6 kg), SpO2 98 %. Body mass index is 26.78 kg/m.   Physical Exam:  General: Alert, interactive, in no acute distress.  Pleasant talkative male. Eyes: No conjunctival injection bilaterally, no discharge on the right, no discharge on the left and no Horner-Trantas dots present. PERRL bilaterally. EOMI without pain. No photophobia.  Ears: Right TM pearly gray with normal light reflex, Left TM pearly gray with normal light reflex, Right TM intact without perforation and Left TM intact without perforation.  Nose/Throat: External nose within normal limits and septum midline. Turbinates edematous with clear discharge. Posterior oropharynx mildly erythematous without cobblestoning in the posterior oropharynx. Tonsils 2+ without exudates.  Tongue without thrush. Neck: Supple without thyromegaly. Trachea midline. Adenopathy: no enlarged lymph nodes appreciated in the anterior cervical, occipital, axillary, epitrochlear, inguinal, or popliteal regions. Lungs: Clear to auscultation without wheezing, rhonchi or rales. No increased work of breathing. CV: Normal S1/S2. No  murmurs. Capillary refill <2 seconds.  Abdomen: Nondistended, nontender. No guarding or rebound tenderness. Bowel sounds present in all fields and hyperactive  Skin: Warm and dry, without lesions or rashes. Extremities:  No clubbing, cyanosis or edema. Neuro:   Grossly intact. No focal deficits appreciated. Responsive to questions.  Diagnostic studies:   Spirometry: results normal (FEV1: 3.62/114%, FVC: 4.12/96%, FEV1/FVC: 87%).    Spirometry consistent with normal pattern.   Allergy Studies:   Airborne Adult Perc - October 12, 2018 1443    Time Antigen  Placed  1443    Allergen Manufacturer  Greer    Location  Back    Number of Test  59    Panel 1  Select    1. Control-Buffer 50% Glycerol  Negative    2. Control-Histamine 1 mg/ml  2+    3. Albumin saline  Negative    4. Bahia  Negative    5. French Southern Territ[place]ermuda  Negative    6. Johnson  Negative    7. Kentucky Blue  Negative    8. Meadow Fescue  Negative    9. Perennial Rye  Negative    10. Sweet Vernal  Negative    11. Timothy  Negative    12. Cocklebur  Negative    13. Burweed Marshelder  Negative    14. Ragweed, short  Negative    15. Ragweed, Giant  Negative    16. Plantain,  English  Negative    17. Lamb's Quarters  Negative    18. Sheep Sorrell  Negative    19. Rough Pigweed  Negative    20. Marsh Elder, Rough  Negative    21. Mugwort, Common  Negative    22. Ash mix  Negative    23. Birch mix  Negative    24. Beech American  Negative    25. Box, Elder  Negative    26. Cedar, red  Negative    27. Cottonwood, Guinea-BissauEastern  Negative    28. Elm mix  Negative    29. Hickory mix  Negative    30. Maple mix  Negative    31. Oak, Guinea-Bissau[place]astern mix  Negative    32. Pecan Pollen  Negative    33. Pine mix  Negative    34. Sycamore Eastern  Negative    35. Walnut, Black Pollen  Negative    36. Alternaria alternata  Negative    37. Cladosporium Herbarum  Negative    38. Aspergillus mix  Negative    39. Penicillium mix  Negative    40. Bipolaris  sorokiniana (Helminthosporium)  Negative    41. Drechslera spicifera (Curvularia)  Negative    42. Mucor plumbeus  Negative    43. Fusarium moniliforme  Negative    44. Aureobasidium pullulans (pullulara)  Negative    45. Rhizopus oryzae  Negative    46. Botrytis cinera  Negative    47. Epicoccum nigrum  Negative    48. Phoma betae  Negative    49. Candida Albicans  Negative    50. Trichophyton mentagrophytes  Negative    51. Mite, D Farinae  5,000 AU/ml  Negative    52. Mite, D Pteronyssinus  5,000 AU/ml  Negative    53. Cat Hair 10,000 BAU/ml  Negative    54.  Dog Epithelia  Negative    55. Mixed Feathers  Negative    56. Horse Epithelia  Negative    57. Cockroach, German  Negative    58. Mouse  Negative    59. Tobacco Leaf  Negative    Comments           Allergy testing results were read and interpreted by myself, documented by clinical staff.       Malachi BondsJoel Rocklin Soderquist, MD [facility]

## 2018-09-30 NOTE — Patient Instructions (Addendum)
1. Moderate persistent asthma without complication - Lung testing looked great today. - We will change your Advair to Symbicort 160/4.5 two puffs twice daily. - Copay card provided for a $0 copay.  - We will get some labs to rule out serious causes of asthma.  - We will also get some labs to see if you would qualify for an asthma biologic, which can help improve your lung function and decrease your asthma exacerbations.  - Spacer sample and demonstration provided. - Daily controller medication(s): Singulair 10mg  daily and Symbicort 160/4.765mcg two puffs twice daily with spacer - Prior to physical activity: ProAir 2 puffs 10-15 minutes before physical activity. - Rescue medications: ProAir 4 puffs every 4-6 hours as needed - Asthma control goals:  * Full participation in all desired activities (may need albuterol before activity) * Albuterol use two time or less a week on average (not counting use with activity) * Cough interfering with sleep two time or less a month * Oral steroids no more than once a year * No hospitalizations  2. Chronic rhinitis - Testing today showed: negative to the entire panel - Copy of test results provided.  - We will confirm this with labs.  - Continue with: Flonase (fluticasone) one spray per nostril daily  3. Return in about 2 months (around 11/30/2018).   Please inform us of any Emergency Department visits, hospitalizations, or changes in symptoms. Call us before going to the ED for breathing or allergy symptoms since we might be able to fit you in for a sick visit. Feel free to contact us anytime with any questions, problems, or concerns.  It was a pleasure to meet you today!  Websites that have reliable patient information: 1. American Academy of Asthma, Allergy, and Immunology: www.aaaai.org 2. Food Allergy Research and Education (FARE): foodallergy.org 3. Mothers of Asthmatics: http://www.asthmacommunitynetwork.org 4. American College of Allergy,  Asthma, and Immunology: MissingWeapons.cawww.acaai.org   Make sure you are registered to vote! If you have moved or changed any of your contact information, you will need to get this updated before voting!

## 2018-10-15 ENCOUNTER — Other Ambulatory Visit: Payer: Self-pay | Admitting: Family Medicine

## 2019-04-15 ENCOUNTER — Other Ambulatory Visit: Payer: Self-pay | Admitting: *Deleted

## 2019-04-15 MED ORDER — MONTELUKAST SODIUM 10 MG PO TABS: 10.0000 mg | ORAL_TABLET | Freq: Every day | ORAL | 0 refills | Status: DC

## 2019-04-15 NOTE — Telephone Encounter (Signed)
Needs ov for further refills 

## 2019-04-19 ENCOUNTER — Other Ambulatory Visit: Payer: Self-pay | Admitting: *Deleted

## 2019-04-19 MED ORDER — BUDESONIDE-FORMOTEROL FUMARATE 160-4.5 MCG/ACT IN AERO: 2.0000 | INHALATION_SPRAY | Freq: Two times a day (BID) | RESPIRATORY_TRACT | 0 refills | Status: DC

## 2019-04-24 ENCOUNTER — Other Ambulatory Visit: Payer: Self-pay | Admitting: Family Medicine

## 2019-05-16 ENCOUNTER — Other Ambulatory Visit: Payer: Self-pay | Admitting: Allergy & Immunology

## 2019-05-17 ENCOUNTER — Other Ambulatory Visit: Payer: Self-pay | Admitting: Family Medicine

## 2019-05-24 ENCOUNTER — Other Ambulatory Visit: Payer: Self-pay | Admitting: Allergy & Immunology

## 2019-05-24 MED ORDER — MONTELUKAST SODIUM 10 MG PO TABS: 10.0000 mg | ORAL_TABLET | Freq: Every day | ORAL | 0 refills | Status: DC

## 2019-05-24 NOTE — Telephone Encounter (Signed)
Sent in courtesy refill patient needs to keep appt to have further refills

## 2019-05-24 NOTE — Telephone Encounter (Signed)
Patient called requesting a refill for Montelukast [facility]. He made an appt for 06-01-19.

## 2019-05-25 ENCOUNTER — Other Ambulatory Visit: Payer: Self-pay | Admitting: *Deleted

## 2019-05-25 MED ORDER — BUDESONIDE-FORMOTEROL FUMARATE 160-4.5 MCG/ACT IN AERO: 2.0000 | INHALATION_SPRAY | Freq: Two times a day (BID) | RESPIRATORY_TRACT | 0 refills | Status: DC

## 2019-06-01 ENCOUNTER — Other Ambulatory Visit: Payer: Self-pay

## 2019-06-01 ENCOUNTER — Ambulatory Visit: Payer: BC Managed Care – PPO | Admitting: Allergy & Immunology

## 2019-06-01 ENCOUNTER — Encounter: Payer: Self-pay | Admitting: Allergy & Immunology

## 2019-06-01 ENCOUNTER — Ambulatory Visit (INDEPENDENT_AMBULATORY_CARE_PROVIDER_SITE_OTHER): Payer: BC Managed Care – PPO | Admitting: Allergy & Immunology

## 2019-06-01 DIAGNOSIS — J310 Chronic rhinitis: Secondary | ICD-10-CM | POA: Diagnosis not present

## 2019-06-01 DIAGNOSIS — J4540 Moderate persistent asthma, uncomplicated: Secondary | ICD-10-CM | POA: Diagnosis not present

## 2019-06-01 MED ORDER — BUDESONIDE-FORMOTEROL FUMARATE 160-4.5 MCG/ACT IN AERO: 2.0000 | INHALATION_SPRAY | Freq: Two times a day (BID) | RESPIRATORY_TRACT | 5 refills | Status: DC

## 2019-06-01 MED ORDER — FLUTICASONE PROPIONATE 50 MCG/ACT NA SUSP: 2.0000 | Freq: Every day | NASAL | 6 refills | Status: DC

## 2019-06-01 MED ORDER — MONTELUKAST SODIUM 10 MG PO TABS: 10.0000 mg | ORAL_TABLET | Freq: Every day | ORAL | 5 refills | Status: DC

## 2019-06-01 NOTE — Progress Notes (Signed)
RE: Erik Ellis MRN: [id number] DOB: 19-Nov-1965 Date of Telemedicine Visit: 06/01/2019  Referring provider: Mikey Kirschner, MD Primary care provider: Mikey Kirschner, MD  Chief Complaint: Asthma   Telemedicine Follow Up Visit via Telephone: I connected with Erik Ellis for a follow up on 06/01/19 by telephone and verified that I am speaking with the correct person using two identifiers.   I discussed the limitations, risks, security and privacy concerns of performing an evaluation and management service by telephone and the availability of in person appointments. I also discussed with the patient that there may be a patient responsible charge related to this service. The patient expressed understanding and agreed to proceed.  Patient is at home accompanied.  Provider is at the office.  Visit start time: 1:23 PM Visit end time: 1:45 PM Insurance consent/check in by: [employer] consent and medical assistant/nurse: Garlon Hatchet  History of Present Illness:  He is a 53 y.o. male, who is being followed for persistent asthma and NAR. His previous allergy office visit was in November 2019 with myself.  At his last visit in November, we started him on Symbicort 160/4.5 mcg 2 puffs twice daily in lieu of his Advair.  We did get some labs to rule out serious causes of asthma, and these were all negative.  We also continued Singulair 10 mg daily and albuterol as needed.  He had testing that was negative to the entire panel.  Since the last visit, he has done well. He has had no flares at all.  He forgot that he had labs to do, but regardless he has done very well even without the labs.  Asthma/Respiratory Symptom History: He remains on the Symbicort and Singulair. This is working well to control his symptoms. He has not needed prednisone at all and no ED visits. He has not used the rescue inhaler at all. He denies night time coughing. He is somewhat concerned about the asthma and  football practices.   Allergic Rhinitis Symptom History: He remains on the Flonase he is using on a rather regular basis.  This seems to be controlling his symptoms well. He has not needed any antibiotics whatsoever.   Otherwise, there have been no changes to his past medical history, surgical history, family history, or social history.  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  They are limiting the training's to 15 students per session.  He does wear a mask when he is outside with the teenagers.  Assessment and Plan:  [name] is a 53 y.o. male with:  Moderate persistent asthma without complication  Non-allergic rhinitis    1. Moderate persistent asthma without complication - We will not make any medication changes at this time.  - Spacer sample and demonstration provided. - Daily controller medication(s): Singulair 10mg  daily and Symbicort 160/4.6mcg two puffs twice daily with spacer - Prior to physical activity: ProAir 2 puffs 10-15 minutes before physical activity. - Rescue medications: ProAir 4 puffs every 4-6 hours as needed - Asthma control goals:  * Full participation in all desired activities (may need albuterol before activity) * Albuterol use two time or less a week on average (not counting use with activity) * Cough interfering with sleep two time or less a month * Oral steroids no more than once a year * No hospitalizations  2. Non-allergic rhinitis - We will not make  any medication changes at all.  - Continue with: Flonase (fluticasone) one spray per nostril daily  3.  Follow-up in 6 months or earlier if needed. This can be an in-person, a virtual Webex or a telephone follow up visit.   Diagnostics: None.  Medication List:  Current Outpatient Medications  Medication Sig Dispense Refill  . albuterol (PROVENTIL HFA;VENTOLIN HFA) 108 (90 Base) MCG/ACT inhaler  Inhale 2 puffs into the lungs every 4 (four) hours as needed. For shortness of breath/wheezing 1 Inhaler 11  . amphetamine-dextroamphetamine (ADDERALL) 10 MG tablet     . budesonide-formoterol (SYMBICORT) 160-4.5 MCG/ACT inhaler Inhale 2 puffs into the lungs 2 (two) times daily. 1 Inhaler 5  . buPROPion (WELLBUTRIN XL) 300 MG 24 hr tablet Take 1 tablet by mouth daily.    . busPIRone (BUSPAR) 15 MG tablet Take 30 mg by mouth 2 (two) times daily.    . fluticasone (FLONASE) 50 MCG/ACT nasal spray Place 2 sprays into both nostrils daily. 48 mL 6  . gabapentin (NEURONTIN) 300 MG capsule Take 300 mg by mouth at bedtime.  4  . lamoTRIgine (LAMICTAL) 200 MG tablet Take 200 mg by mouth 2 (two) times daily.    Marland Kitchen. lithium carbonate (LITHOBID) 300 MG CR tablet Take 1,200 mg by mouth at bedtime.    . montelukast (SINGULAIR) 10 MG tablet Take 1 tablet (10 mg total) by mouth at bedtime. 30 tablet 5  . pantoprazole (PROTONIX) 40 MG tablet TAKE 1 TABLET BY MOUTH EVERY DAY 90 tablet 0  . sildenafil (VIAGRA) 50 MG tablet TAKE 1 TABLET BY MOUTH AS NEEDED AN HOUR BEFORE SEX. DO NOT TAKE MORE THAN 1 TIME IN 24 HOURS. THIS IS NOT A DAILY MEDICINE.  1  . XIIDRA 5 % SOLN INSTILL 1 DROP INTO BOTH EYES TWICE A DAY 2 MINUTE LID CLOSURE AFTER INSTILLING  0  . zolpidem (AMBIEN) 10 MG tablet Take 5-10 mg by mouth at bedtime.      No current facility-administered medications for this visit.    Allergies: No Known Allergies I reviewed his past medical history, social history, family history, and environmental history and no significant changes have been reported from previous visits.  Review of Systems  Constitutional: Negative for chills, diaphoresis, fatigue and fever.  HENT: Negative for congestion, ear discharge, ear pain, facial swelling, postnasal drip, rhinorrhea, sinus pressure, sinus pain and sore throat.   Eyes: Negative for pain, discharge and itching.  Respiratory: Negative for apnea, cough, choking, chest  tightness and shortness of breath.   Cardiovascular: Negative for chest pain.  Gastrointestinal: Negative for diarrhea and nausea.  Musculoskeletal: Negative for arthralgias and myalgias.  Skin: Negative for rash.  Allergic/Immunologic: Negative for environmental allergies and food allergies.    Objective:  Physical exam not obtained as encounter was done via telephone.   Previous notes and tests were reviewed.  I discussed the assessment and treatment plan with the patient. The patient was provided an opportunity to ask questions and all were answered. The patient agreed with the plan and demonstrated an understanding of the instructions.   The patient was advised to call back or seek an in-person evaluation if the symptoms worsen or if the condition fails to improve as anticipated.  I provided 22 minutes of non-face-to-face time during this encounter.  It was my pleasure to participate in Esmond CamperMichael Chew's care today. Please feel free to contact me with any questions or concerns.   Sincerely,  Alfonse SpruceJoel Louis Gallagher, MD

## 2019-06-01 NOTE — Patient Instructions (Addendum)
1. Moderate persistent asthma without complication - We will not make any medication changes at this time.  - Spacer sample and demonstration provided. - Daily controller medication(s): Singulair 10mg  daily and Symbicort 160/4.58mcg two puffs twice daily with spacer - Prior to physical activity: ProAir 2 puffs 10-15 minutes before physical activity. - Rescue medications: ProAir 4 puffs every 4-6 hours as needed - Asthma control goals:  * Full participation in all desired activities (may need albuterol before activity) * Albuterol use two time or less a week on average (not counting use with activity) * Cough interfering with sleep two time or less a month * Oral steroids no more than once a year * No hospitalizations  2. Non-allergic rhinitis - We will not make any medication changes at all.  - Continue with: Flonase (fluticasone) one spray per nostril daily  3. No follow-ups on file. This can be an in-person, a virtual Webex or a telephone follow up visit.   Please inform us of any Emergency Department visits, hospitalizations, or changes in symptoms. Call us before going to the ED for breathing or allergy symptoms since we might be able to fit you in for a sick visit. Feel free to contact us anytime with any questions, problems, or concerns.  It was a pleasure to talk to you today today!  Websites that have reliable patient information: 1. American Academy of Asthma, Allergy, and Immunology: www.aaaai.org 2. Food Allergy Research and Education (FARE): foodallergy.org 3. Mothers of Asthmatics: http://www.asthmacommunitynetwork.org 4. American College of Allergy, Asthma, and Immunology: www.acaai.org  "Like" [place] on Facebook and Instagram for our latest updates!      Make sure you are registered to vote! If you have moved or changed any of your contact information, you will need to get this updated before voting!  In some cases, you MAY be able to register to vote online:  [email or web address]    Voter ID laws are NOT going into effect for the General Election in November 2020! DO NOT let this stop you from exercising your right to vote!   Absentee voting is the SAFEST way to vote during the coronavirus pandemic!   Download and print an absentee ballot request form at [email or web address] or you can scan the QR code below with your smart phone:      More information on absentee ballots can be found here: [email or web address]

## 2019-06-09 ENCOUNTER — Other Ambulatory Visit: Payer: Self-pay

## 2019-06-09 ENCOUNTER — Ambulatory Visit (INDEPENDENT_AMBULATORY_CARE_PROVIDER_SITE_OTHER): Payer: BC Managed Care – PPO | Admitting: Family Medicine

## 2019-06-09 DIAGNOSIS — J4521 Mild intermittent asthma with (acute) exacerbation: Secondary | ICD-10-CM

## 2019-06-09 DIAGNOSIS — J019 Acute sinusitis, unspecified: Secondary | ICD-10-CM

## 2019-06-09 DIAGNOSIS — B349 Viral infection, unspecified: Secondary | ICD-10-CM | POA: Diagnosis not present

## 2019-06-09 MED ORDER — PREDNISONE 20 MG PO TABS: ORAL_TABLET | ORAL | 0 refills | Status: DC

## 2019-06-09 MED ORDER — AZITHROMYCIN 250 MG PO TABS: ORAL_TABLET | ORAL | 0 refills | Status: DC

## 2019-06-09 NOTE — Progress Notes (Signed)
Subjective:    Patient ID: Erik Ellis, male    DOB: 02-06-1966, [age] y.o.   MRN: [id number]  Sore Throat  This is a new problem. The current episode started in the past 7 days. Associated symptoms include congestion, coughing, headaches and shortness of breath. Pertinent negatives include no ear pain or vomiting. Associated symptoms comments: Chest pain and pain between shoulder blades fatigue. He has tried acetaminophen for the symptoms.   Patient relates that over the past week he has noticed some congestion coughing headaches he also relates at times he feels like he cannot get a good deep breath he states he also gets a sharp discomfort intermittently in the anterior portion of his chest and now he gets some intermittent sharp pains in the back of his chest he denies true shortness of breath he just states it is harder to take a deep breath.  He states if he moves around the house he does not get short of breath and the matter fact he has been staying busy with his normal activities.  He does have asthma and he feels like this is a flareup of his asthma.  He has not been around anyone with COVID and denies high fever chills or severe body aches   Review of Systems  Constitutional: Positive for fatigue. Negative for activity change, chills and fever.  HENT: Positive for congestion and sore throat. Negative for ear pain and rhinorrhea.   Eyes: Negative for discharge.  Respiratory: Positive for cough and shortness of breath. Negative for wheezing.   Cardiovascular: Positive for chest pain. Negative for leg swelling.  Gastrointestinal: Negative for nausea and vomiting.  Musculoskeletal: Negative for arthralgias.  Neurological: Positive for headaches.   Virtual Visit via Video Note  I connected with Erik Ellis on 06/09/19 at  4:10 PM EDT by a video enabled telemedicine application and verified that I am speaking with the correct person using two identifiers.  Location: Patient: home  Provider: office   I discussed the limitations of evaluation and management by telemedicine and the availability of in person appointments. The patient expressed understanding and agreed to proceed.  History of Present Illness:    Observations/Objective:   Assessment and Plan:   Follow Up Instructions:    I discussed the assessment and treatment plan with the patient. The patient was provided an opportunity to ask questions and all were answered. The patient agreed with the plan and demonstrated an understanding of the instructions.   The patient was advised to call back or seek an in-person evaluation if the symptoms worsen or if the condition fails to improve as anticipated.  I provided 15 minutes of non-face-to-face time during this encounter.        Objective:   Physical Exam Overall the patient looks good Patient had virtual visit Appears to be in no distress Atraumatic Neuro able to relate and oriented No apparent resp distress Color normal       Assessment & Plan:  It is concerning what is going on with him The fact that he has had this viral-like illness over the past week with some sore throat and coughing and congestion along with some intermittent sharp pains points toward the possibility of COVID I recommended testing to be done in the morning I went over with him the warning signs regarding progressive symptoms and the need to go to the ER if getting worse  I do not feel his chest pain is a sign of cardiac disease but  I told him if it becomes severe he will need to go to the ER.  He will go for COVID testing in the morning  We will go ahead and treat him for a asthma exacerbation with prednisone and Zithromax  Patient was instructed to follow-up closely with [place] if any problems and go to ER if worsening issues

## 2019-06-10 ENCOUNTER — Other Ambulatory Visit: Payer: Self-pay

## 2019-06-10 DIAGNOSIS — Z20822 Contact with and (suspected) exposure to covid-19: Secondary | ICD-10-CM

## 2019-06-11 LAB — NOVEL CORONAVIRUS, NAA: SARS-CoV-2, NAA: NOT DETECTED

## 2019-06-16 ENCOUNTER — Other Ambulatory Visit: Payer: Self-pay | Admitting: Family Medicine

## 2019-06-21 ENCOUNTER — Other Ambulatory Visit: Payer: Self-pay | Admitting: Allergy & Immunology

## 2019-06-28 ENCOUNTER — Encounter: Payer: Self-pay | Admitting: Family Medicine

## 2019-06-28 ENCOUNTER — Other Ambulatory Visit: Payer: Self-pay

## 2019-06-28 ENCOUNTER — Ambulatory Visit (INDEPENDENT_AMBULATORY_CARE_PROVIDER_SITE_OTHER): Payer: BC Managed Care – PPO | Admitting: Family Medicine

## 2019-06-28 VITALS — Temp 98.2°F

## 2019-06-28 DIAGNOSIS — J4541 Moderate persistent asthma with (acute) exacerbation: Secondary | ICD-10-CM | POA: Diagnosis not present

## 2019-06-28 MED ORDER — PREDNISONE 20 MG PO TABS: ORAL_TABLET | ORAL | 0 refills | Status: DC

## 2019-06-28 MED ORDER — CEFDINIR 300 MG PO CAPS: ORAL_CAPSULE | ORAL | 0 refills | Status: DC

## 2019-06-28 NOTE — Progress Notes (Signed)
   Subjective:    Patient ID: Erik Ellis, male    DOB: 1966-04-29, 53 y.o.   MRN: [id number]  Cough This is a new problem. The current episode started 1 to 4 weeks ago (2 weeks). The cough is non-productive. Associated symptoms include a sore throat. Associated symptoms comments: Cough, shortness of bread. He has tried nothing for the symptoms. His past medical history is significant for asthma.    Patient states he has had another flare of his asthma.  See prior note.  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  Patient states he pretty much completely better.  These new symptoms emerged over the past week.  Cough is nonproductive.  No fever.  Patient thinks a sore throat from drainage.  Notes tightness and wheeziness.  Compliant with his chronic asthma medications.  Almost went to his asthma doctor but called [place] instead.  Review of Systems  HENT: Positive for sore throat.   Respiratory: Positive for cough.        Objective:   Physical Exam  Alert active no acute distress.  HEENT TMs normal pharynx appears normal neck supple lungs clear to auscultation except when coughing mild wheezy type treatment.  No expiratory crackles no tachypnea  O2 sat 96%      Assessment & Plan:  Impression re-flare of asthma.  We discussed the potential this could be related to the COVID-19 with a negative swab.  At any rate if this were the case 2 weeks ago the virus is likely gone and the patient is doing with residual effect on his airways.  Patient was fairly resistant to this notion.  Will treat with another round of antibiotics and another steroid taper.  If patient repeats another flare, would recommend to get back to his allergy asthma specialist numerous questions answered.25 Greater than 50% of this 25 minute face to face visit was spent in counseling and discussion and coordination of care regarding the above diagnosis/diagnosies

## 2019-07-21 ENCOUNTER — Other Ambulatory Visit: Payer: Self-pay | Admitting: Family Medicine

## 2019-08-04 ENCOUNTER — Telehealth: Payer: Self-pay | Admitting: Family Medicine

## 2019-08-04 ENCOUNTER — Ambulatory Visit (INDEPENDENT_AMBULATORY_CARE_PROVIDER_SITE_OTHER): Payer: BC Managed Care – PPO | Admitting: Family Medicine

## 2019-08-04 ENCOUNTER — Other Ambulatory Visit: Payer: Self-pay | Admitting: *Deleted

## 2019-08-04 ENCOUNTER — Encounter: Payer: Self-pay | Admitting: Family Medicine

## 2019-08-04 ENCOUNTER — Other Ambulatory Visit: Payer: Self-pay

## 2019-08-04 DIAGNOSIS — J4541 Moderate persistent asthma with (acute) exacerbation: Secondary | ICD-10-CM | POA: Diagnosis not present

## 2019-08-04 MED ORDER — PREDNISONE 20 MG PO TABS: ORAL_TABLET | ORAL | 0 refills | Status: DC

## 2019-08-04 MED ORDER — ALBUTEROL SULFATE HFA 108 (90 BASE) MCG/ACT IN AERS: 2.0000 | INHALATION_SPRAY | RESPIRATORY_TRACT | 2 refills | Status: DC | PRN

## 2019-08-04 MED ORDER — PANTOPRAZOLE SODIUM 40 MG PO TBEC: 40.0000 mg | DELAYED_RELEASE_TABLET | Freq: Every day | ORAL | 3 refills | Status: DC

## 2019-08-04 NOTE — Telephone Encounter (Signed)
Sure one yers worth

## 2019-08-04 NOTE — Telephone Encounter (Signed)
Patient had virtual visit this morning and forgot to ask for prescription for pantoprazole 40 mg to be call in along with his other medications.

## 2019-08-04 NOTE — Telephone Encounter (Signed)
Refills sent. Pt notified.

## 2019-08-04 NOTE — Progress Notes (Signed)
   Subjective:  Audio plus video  Patient ID: Erik Ellis, male    DOB: [birth year], 53 y.o.   MRN: [id number]  HPI  Patient calls to discuss flare of asthma and SOB for a week. Patient has an appt with asthmas specialist 08/18/2019.  Virtual Visit via Video Note  I connected with Ronney Honeywell on 08/04/19 at  9:30 AM EDT by a video enabled telemedicine application and verified that I am speaking with the correct person using two identifiers.  Location: Patient: home Provider: office   I discussed the limitations of evaluation and management by telemedicine and the availability of in person appointments. The patient expressed understanding and agreed to proceed.  History of Present Illness:    Observations/Objective:   Assessment and Plan:   Follow Up Instructions:    I discussed the assessment and treatment plan with the patient. The patient was provided an opportunity to ask questions and all were answered. The patient agreed with the plan and demonstrated an understanding of the instructions.   The patient was advised to call back or seek an in-person evaluation if the symptoms worsen or if the condition fails to improve as anticipated.  I provided 20 minutes of non-face-to-face time during this encounter.  Please see prior notes.  This is his third exacerbation.  The last time I asked him to contact his asthma specialist.  He has done this but cannot get an appointment for over 2 weeks.  Notes substantial wheezing.  Several times per day having to use the rescue inhaler.  No fever.  Cough not productive.  Has been getting outdoors quite a bit with football practice   Review of Systems No fever no chills no chest pain    Objective:   Physical Exam   Virtual speaking in full sentences no acute distress occasional cough during visit     Assessment & Plan:  Impression exacerbation of asthma.  Discussed.  Will do prednisone taper.  Albuterol refilled.  Warning signs  discussed carefully.  Be sure to follow-up with asthma specialist as scheduled in a couple weeks

## 2019-08-18 ENCOUNTER — Other Ambulatory Visit: Payer: Self-pay

## 2019-08-18 ENCOUNTER — Encounter: Payer: Self-pay | Admitting: Allergy & Immunology

## 2019-08-18 ENCOUNTER — Ambulatory Visit: Payer: BC Managed Care – PPO | Admitting: Allergy & Immunology

## 2019-08-18 VITALS — BP 136/74 | HR 84 | Temp 99.1°F | Resp 18 | Ht 67.5 in | Wt 176.0 lb

## 2019-08-18 DIAGNOSIS — J4540 Moderate persistent asthma, uncomplicated: Secondary | ICD-10-CM

## 2019-08-18 DIAGNOSIS — J310 Chronic rhinitis: Secondary | ICD-10-CM

## 2019-08-18 MED ORDER — BREZTRI AEROSPHERE 160-9-4.8 MCG/ACT IN AERO: 2.0000 | INHALATION_SPRAY | Freq: Two times a day (BID) | RESPIRATORY_TRACT | 5 refills | Status: DC

## 2019-08-18 NOTE — Patient Instructions (Addendum)
1. Moderate persistent asthma without complication - Lung function looks good today. - However, since you are having more symptoms more consistently, let's try changing from the Symbicort to Breztri (Symbicort Plus).  - Try to get those labs, which might help determine whether one of the injectable medications might be more appropriate for you.   - Daily controller medication(s): Breztri two puffs twice daily and Singulair 10mg  daily - Prior to physical activity: ProAir 2 puffs 10-15 minutes before physical activity. - Rescue medications: ProAir 4 puffs every 4-6 hours as needed - Asthma control goals:  * Full participation in all desired activities (may need albuterol before activity) * Albuterol use two time or less a week on average (not counting use with activity) * Cough interfering with sleep two time or less a month * Oral steroids no more than once a year * No hospitalizations  2. Non-allergic rhinitis - We will not make any medication changes at all.  - Continue with: Flonase (fluticasone) one spray per nostril daily  3. Return in about 6 months (around 02/16/2020). This can be an in-person, a virtual Webex or a telephone follow up visit.   Please inform us of any Emergency Department visits, hospitalizations, or changes in symptoms. Call us before going to the ED for breathing or allergy symptoms since we might be able to fit you in for a sick visit. Feel free to contact us anytime with any questions, problems, or concerns.  It was a pleasure to talk to you today today!  Websites that have reliable patient information: 1. American Academy of Asthma, Allergy, and Immunology: www.aaaai.org 2. Food Allergy Research and Education (FARE): foodallergy.org 3. Mothers of Asthmatics: http://www.asthmacommunitynetwork.org 4. American College of Allergy, Asthma, and Immunology: www.acaai.org  "Like" [place] on Facebook and Instagram for our latest updates!      Make sure you are  registered to vote! If you have moved or changed any of your contact information, you will need to get this updated before voting!  In some cases, you MAY be able to register to vote online: [email or web address]    Voter ID laws are NOT going into effect for the General Election in November 2020! DO NOT let this stop you from exercising your right to vote!   Absentee voting is the SAFEST way to vote during the coronavirus pandemic!   Download and print an absentee ballot request form at [email or web address] or you can scan the QR code below with your smart phone:      More information on absentee ballots can be found here: [email or web address]

## 2019-08-18 NOTE — Progress Notes (Signed)
FOLLOW UP  Date of Service/Encounter:  08/18/19   Assessment:   Moderate persistent asthma without complication - alpha-1 antitrpysin and Aspergillus precipitins pending  Non-allergic rhinitis  Plan/Recommendations:   1. Moderate persistent asthma without complication - Lung function looks good today. - However, since you are having more symptoms more consistently, let's try changing from the Symbicort to Breztri (Symbicort Plus).  - Try to get those labs, which might help determine whether one of the injectable medications might be more appropriate for you.   - We could consider adding in an ICS to use during respiratory flares as well if he is interested, but we will try changing to [facility] first.  - Daily controller medication(s): Breztri two puffs twice daily and Singulair 10mg  daily - Prior to physical activity: ProAir 2 puffs 10-15 minutes before physical activity. - Rescue medications: ProAir 4 puffs every 4-6 hours as needed - Asthma control goals:  * Full participation in all desired activities (may need albuterol before activity) * Albuterol use two time or less a week on average (not counting use with activity) * Cough interfering with sleep two time or less a month * Oral steroids no more than once a year * No hospitalizations  2. Non-allergic rhinitis - We will not make any medication changes at all.  - Continue with: Flonase (fluticasone) one spray per nostril daily  3. Return in about 6 months (around 02/16/2020). This can be an in-person, a virtual Webex or a telephone follow up visit.  Subjective:   Erik Ellis is a 53 y.o. male presenting today for follow up of  Chief Complaint  Patient presents with  . Asthma    Increased flares over past 2 months  . Cough    Nonproductive cough worse laster in day  . Wheezing    Erik Ellis has a history of the following: Patient Active Problem List   Diagnosis Date Noted  . Elevated serum creatinine  05/02/2016  . Allergic rhinitis 02/17/2016  . Asthma with acute exacerbation 05/31/2015  . Bipolar disorder (HCC) 05/31/2015  . Esophageal reflux 05/31/2015    History obtained from: chart review and patient.  Erik Ellis is a 53 y.o. male presenting for a follow up visit. He was last seen in July 2020 via a telephone visit. At that time, he was doing very well. We continued with Symbicort two puffs BID in combination with Singulair 10mg  daily. We also continued with albuterol as needed. For his NAR, we continued with fluticasone one spray per nostril daily.  Since the last visit, he has not been doing well. He did well last time I talked to him. He was COVID tested since the last visit to some viral symptoms, but this was consistent with his typical fall allergy symptoms.   Asthma/Respiratory Symptom History: He tends to have flares every month. He takes prednisone and it improves and then the symptoms recur within a couple of weeks. The prednisone tends to help and then it stops working. Every six weeks or so, he requires prednisone, but then it keeps coming back. He is using two puffs twice daily. He he is unsure why his control has worsened since the last time that I saw him. He has not needed any hospitalizations or ED visits, but he has continued to have regular exacerbations. All of the exacerbations have very responsive to prednisone. There is a strong family history of breathing problems. Evidently his father was steroid dependent for several years, but he was a  smoker.   Allergic Rhinitis Symptom History: He remains on the fluticasone daily. This seems to be controlling his rhinitis without any problems. He has not needed antibiotics during all of these times that he has needed prednisone  Otherwise, there have been no changes to his past medical history, surgical history, family history, or social history.    Review of Systems  Constitutional: Negative.  Negative for fever,  malaise/fatigue and weight loss.  HENT: Negative.  Negative for congestion, ear discharge, ear pain and sore throat.   Eyes: Negative for pain, discharge and redness.  Respiratory: Positive for cough, shortness of breath and wheezing. Negative for sputum production.   Cardiovascular: Negative.  Negative for chest pain and palpitations.  Gastrointestinal: Negative for abdominal pain, constipation, diarrhea, heartburn, nausea and vomiting.  Skin: Negative.  Negative for itching and rash.  Neurological: Negative for dizziness and headaches.  Endo/Heme/Allergies: Negative for environmental allergies. Does not bruise/bleed easily.       Objective:   Blood pressure 136/74, pulse 84, temperature 99.1 F (37.3 C), temperature source Temporal, resp. rate 18, height 5' 7.5" (1.715 m), weight 176 lb (79.8 kg), SpO2 98 %. Body mass index is 27.16 kg/m.   Physical Exam:  Physical Exam  Constitutional: He appears well-developed.  Smiling male. Cooperative with the exam.   HENT:  Head: Normocephalic and atraumatic.  Right Ear: Tympanic membrane, external ear and ear canal normal.  Left Ear: Tympanic membrane and ear canal normal.  Nose: No mucosal edema, rhinorrhea, nasal deformity or septal deviation. No epistaxis. Right sinus exhibits no maxillary sinus tenderness and no frontal sinus tenderness. Left sinus exhibits no maxillary sinus tenderness and no frontal sinus tenderness.  Mouth/Throat: Uvula is midline and oropharynx is clear and moist. Mucous membranes are not pale and not dry.  Eyes: Pupils are equal, round, and reactive to light. Conjunctivae and EOM are normal. Right eye exhibits no chemosis and no discharge. Left eye exhibits no chemosis and no discharge. Right conjunctiva is not injected. Left conjunctiva is not injected.  Cardiovascular: Normal rate, regular rhythm and normal heart sounds.  Respiratory: Effort normal and breath sounds normal. No accessory muscle usage. No  tachypnea. No respiratory distress. He has no wheezes. He has no rhonchi. He has no rales. He exhibits no tenderness.  Moving air well in all lung fields. No increased work of breathing noted.   Lymphadenopathy:    He has no cervical adenopathy.  Neurological: He is alert.  Skin: No abrasion, no petechiae and no rash noted. Rash is not papular, not vesicular and not urticarial. No erythema. No pallor.  No eczematous or urticarial lesions noted.  Psychiatric: He has a normal mood and affect.     Diagnostic studies:    Spirometry: results normal (FEV1: 3.45/106%, FVC: 4.10/92%, FEV1/FVC: 84%).    Spirometry consistent with normal pattern.   Allergy Studies: none       Salvatore Marvel, MD  [facility] [place] [facility] [place]

## 2019-08-23 LAB — ASPERGILLUS PRECIPITINS
A.Fumigatus #1 Abs: NEGATIVE
Aspergillus Flavus Antibodies: NEGATIVE
Aspergillus Niger Antibodies: NEGATIVE
Aspergillus glaucus IgG: NEGATIVE
Aspergillus nidulans IgG: NEGATIVE
Aspergillus terreus IgG: NEGATIVE

## 2019-08-23 LAB — IGE+ALLERGENS ZONE 2(30)

## 2019-08-23 LAB — CBC WITH DIFFERENTIAL/PLATELET
Basophils Absolute: 0 10*3/uL (ref 0.0–0.2)
Basos: 0 %
EOS (ABSOLUTE): 0.1 10*3/uL (ref 0.0–0.4)
Eos: 1 %
Hematocrit: 41.6 % (ref 37.5–51.0)
Hemoglobin: 14.1 g/dL (ref 13.0–17.7)
Immature Grans (Abs): 0 10*3/uL (ref 0.0–0.1)
Immature Granulocytes: 0 %
Lymphocytes Absolute: 1.2 10*3/uL (ref 0.7–3.1)
Lymphs: 15 %
MCH: 31.1 pg (ref 26.6–33.0)
MCHC: 33.9 g/dL (ref 31.5–35.7)
MCV: 92 fL (ref 79–97)
Monocytes Absolute: 0.4 10*3/uL (ref 0.1–0.9)
Monocytes: 6 %
Neutrophils Absolute: 6.1 10*3/uL (ref 1.4–7.0)
Neutrophils: 78 %
Platelets: 261 10*3/uL (ref 150–450)
RBC: 4.53 x10E6/uL (ref 4.14–5.80)
RDW: 12.2 % (ref 11.6–15.4)
WBC: 7.9 10*3/uL (ref 3.4–10.8)

## 2019-08-23 LAB — ALPHA-1-ANTITRYPSIN: A-1 Antitrypsin: 149 mg/dL (ref 101–187)

## 2019-09-01 ENCOUNTER — Encounter: Payer: Self-pay | Admitting: Allergy & Immunology

## 2019-09-09 NOTE — Telephone Encounter (Signed)
PA for Erik Ellis is being process through [email or web address]. Awaiting approval.

## 2019-09-13 NOTE — Telephone Encounter (Signed)
PA for Erik Ellis was resent and we will need to follow up on this.

## 2019-09-15 ENCOUNTER — Encounter: Payer: Self-pay | Admitting: Allergy & Immunology

## 2019-09-15 ENCOUNTER — Telehealth: Payer: Self-pay | Admitting: Allergy & Immunology

## 2019-09-15 NOTE — Telephone Encounter (Signed)
Patient called stating that his Asthma is flaring up and would like to know if Dr. Ernst Bowler would call in more Prednisone without being seen. Patient states that the Prednisone seems to be effective.  Please advise.

## 2019-09-15 NOTE — Telephone Encounter (Signed)
We can send in a short burst of prednisone: 20 mg twice daily for 5 days.  We changed his inhaler at the last visit.  Did not help at all?  I made a route this note to Tammy for submission for Berna Bue.  He definitely needs a biologic to avoid all of this prednisone.  This is not a long-term solution.  Salvatore Marvel, MD [facility] [place] [facility] [place]

## 2019-09-15 NOTE — Telephone Encounter (Signed)
Dr. Gallagher please advise.  

## 2019-09-16 ENCOUNTER — Other Ambulatory Visit: Payer: Self-pay

## 2019-09-16 MED ORDER — PREDNISONE 20 MG PO TABS: ORAL_TABLET | ORAL | 0 refills | Status: DC

## 2019-09-16 NOTE — Telephone Encounter (Signed)
Noted. Thank you!  Joel Gallagher, MD [facility] [place]  

## 2019-09-16 NOTE — Telephone Encounter (Signed)
Patient verbalizes that the new inhaler worked well initially, but that he still experienced another flare. I made him aware that prednisone was sent to the pharmacy and that [place] would be in contact with him in regards to starting the [facility] for long term control and to combat continued steroid use. He verbalized understanding.

## 2019-09-17 NOTE — Telephone Encounter (Signed)
L/m for patient to contact me to discuss starting [place] for asthma control

## 2019-09-17 NOTE — Telephone Encounter (Signed)
Spoke to patient and explained Erik Ellis how it works, Warehouse manager , copay card and submissiong

## 2019-09-20 NOTE — Telephone Encounter (Signed)
Not covered by insurance.

## 2019-09-25 ENCOUNTER — Other Ambulatory Visit: Payer: Self-pay

## 2019-09-25 DIAGNOSIS — Z20822 Contact with and (suspected) exposure to covid-19: Secondary | ICD-10-CM

## 2019-09-27 ENCOUNTER — Other Ambulatory Visit: Payer: Self-pay | Admitting: *Deleted

## 2019-09-27 ENCOUNTER — Encounter: Payer: Self-pay | Admitting: Allergy & Immunology

## 2019-09-27 LAB — NOVEL CORONAVIRUS, NAA: SARS-CoV-2, NAA: NOT DETECTED

## 2019-09-27 MED ORDER — SPIRIVA RESPIMAT 1.25 MCG/ACT IN AERS: 2.0000 | INHALATION_SPRAY | Freq: Every day | RESPIRATORY_TRACT | 5 refills | Status: DC

## 2019-10-04 DIAGNOSIS — Q615 Medullary cystic kidney: Secondary | ICD-10-CM | POA: Insufficient documentation

## 2019-10-04 DIAGNOSIS — N289 Disorder of kidney and ureter, unspecified: Secondary | ICD-10-CM | POA: Insufficient documentation

## 2019-10-04 DIAGNOSIS — Z79899 Other long term (current) drug therapy: Secondary | ICD-10-CM | POA: Insufficient documentation

## 2019-10-11 ENCOUNTER — Encounter: Payer: Self-pay | Admitting: Allergy & Immunology

## 2019-10-11 NOTE — Telephone Encounter (Signed)
Awesome - thank you, Tammy!   Salvatore Marvel, MD [facility] [place] [facility] [place]

## 2019-10-11 NOTE — Telephone Encounter (Signed)
Where are we with this patient's Erik Ellis submission?  Can he get a sample to get him started?  He is a Erik Ellis patient.  Salvatore Marvel, MD [facility] [place] [facility] [place]

## 2019-10-12 ENCOUNTER — Encounter: Payer: Self-pay | Admitting: Allergy & Immunology

## 2019-10-20 NOTE — Telephone Encounter (Signed)
Patient called stating he received his Berna Bue and is schedule tomorrow 10/21/2019 for teaching for self injections

## 2019-10-21 ENCOUNTER — Ambulatory Visit: Payer: Self-pay

## 2019-10-21 ENCOUNTER — Other Ambulatory Visit: Payer: Self-pay

## 2019-10-21 DIAGNOSIS — J4540 Moderate persistent asthma, uncomplicated: Secondary | ICD-10-CM

## 2019-10-21 MED ORDER — EPINEPHRINE 0.3 MG/0.3ML IJ SOAJ: 0.3000 mg | Freq: Once | INTRAMUSCULAR | 1 refills | Status: DC | PRN

## 2019-10-21 NOTE — Progress Notes (Signed)
Immunotherapy   Patient Details  Name: Erik Ellis MRN: [id number] Date of Birth: 09-15-1966  10/21/2019  Erik Ellis started injections for  [facility] AutoInjector. Patient instructed on proper use of autoinjector. He demostrated proper injection technique and self adminitered the Fasenra into his left abdomen. Patient waited 30 minutes post injection in office. EpiPen teaching and prescription sent. Patient showed no local or systemic reactions upon leaving office. Consent signed and patient instructions given. Patient instructed to call us if there are any issues.    Rosalio Loud 10/21/2019, 9:33 AM

## 2019-10-21 NOTE — Telephone Encounter (Signed)
That is good to hear.Thanks for taking care of him.  Salvatore Marvel, MD [facility] [place] [facility] [place]

## 2019-10-26 ENCOUNTER — Ambulatory Visit: Payer: BC Managed Care – PPO | Attending: Internal Medicine

## 2019-10-26 ENCOUNTER — Other Ambulatory Visit: Payer: Self-pay

## 2019-10-26 DIAGNOSIS — Z20822 Contact with and (suspected) exposure to covid-19: Secondary | ICD-10-CM

## 2019-10-28 LAB — NOVEL CORONAVIRUS, NAA: SARS-CoV-2, NAA: NOT DETECTED

## 2019-11-04 ENCOUNTER — Other Ambulatory Visit: Payer: Self-pay

## 2019-11-04 ENCOUNTER — Ambulatory Visit: Payer: BC Managed Care – PPO | Attending: Internal Medicine

## 2019-11-04 DIAGNOSIS — Z20822 Contact with and (suspected) exposure to covid-19: Secondary | ICD-10-CM

## 2019-11-05 LAB — NOVEL CORONAVIRUS, NAA: SARS-CoV-2, NAA: NOT DETECTED

## 2019-11-11 ENCOUNTER — Other Ambulatory Visit: Payer: Self-pay

## 2019-11-11 MED ORDER — MONTELUKAST SODIUM 10 MG PO TABS: 10.0000 mg | ORAL_TABLET | Freq: Every day | ORAL | 5 refills | Status: DC

## 2019-11-12 ENCOUNTER — Encounter: Payer: Self-pay | Admitting: Allergy & Immunology

## 2019-11-12 ENCOUNTER — Other Ambulatory Visit: Payer: Self-pay

## 2019-11-12 ENCOUNTER — Ambulatory Visit (INDEPENDENT_AMBULATORY_CARE_PROVIDER_SITE_OTHER): Payer: BC Managed Care – PPO | Admitting: Allergy & Immunology

## 2019-11-12 VITALS — BP 126/68 | HR 78 | Temp 98.1°F | Resp 18

## 2019-11-12 DIAGNOSIS — J310 Chronic rhinitis: Secondary | ICD-10-CM | POA: Diagnosis not present

## 2019-11-12 DIAGNOSIS — J4540 Moderate persistent asthma, uncomplicated: Secondary | ICD-10-CM | POA: Diagnosis not present

## 2019-11-12 NOTE — Progress Notes (Signed)
FOLLOW UP  Date of Service/Encounter:  11/12/19   Assessment:   Moderate persistent asthma without complication - alpha-1 antitrpysin and Aspergillus precipitins all normal  Non-allergic rhinitis  Plan/Recommendations:   1. Moderate persistent asthma without complication - Lung function looks excellent today. - We are not going to make any big changes since you are stable.  - Stop the Spiriva for now and use Incruse one puff daily until they are gone (this is in the same class as Spiriva).  - I will be honest that this is not a traditional regimen, but if it is making your symptoms stable and decreasing your prednisone exposure, this is a good thing.  - Daily controller medication(s): Incruse one puff once daily and Singulair 10mg  daily and Fasenra (monthly for three doses and then every 8 weeks thereafter) - Prior to physical activity: ProAir 2 puffs 10-15 minutes before physical activity. - Rescue medications: ProAir 4 puffs every 4-6 hours as needed - Asthma control goals:  * Full participation in all desired activities (may need albuterol before activity) * Albuterol use two time or less a week on average (not counting use with activity) * Cough interfering with sleep two time or less a month * Oral steroids no more than once a year * No hospitalizations  2. Non-allergic rhinitis - We will not make any medication changes at all.  - Continue with: Flonase (fluticasone) one spray per nostril daily  3. Return in about 6 months (around 05/11/2020). This can be an in-person, a virtual Webex or a telephone follow up visit.   Subjective:   Erik Ellis is a 54 y.o. male presenting today for follow up of  Chief Complaint  Patient presents with  . Asthma    No problems    Erik Ellis has a history of the following: Patient Active Problem List   Diagnosis Date Noted  . Lithium use 10/04/2019  . 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 Mild renal insufficiency  10/04/2019  . Acute intractable headache 07/03/2016  . Elevated serum creatinine 05/02/2016  . Allergic rhinitis 02/17/2016  . Asthma with acute exacerbation 05/31/2015  . Bipolar disorder ([place]) 05/31/2015  . Esophageal reflux 05/31/2015  . Asthma, mild persistent 04/27/2013  . Diverticulosis 09/02/2012  . H/O colonoscopy with polypectomy 09/02/2012  . Sleep disorder 12/04/2011    History obtained from: chart review and patient.  Erik Ellis is a 54 y.o. male presenting for a follow up visit.  He was last seen in October 2020.  At that time, his lung function looked good.  He was having symptoms more consistently so we changed him from Symbicort to [place] 2 puffs twice daily.  We also obtained labs which showed that he was not eligible for any Biologics.  However, we did get him approved for Berna Bue based on the steroid sparing indication because he was needing steroids so often.  His symptoms have always been exquisitely susceptible to steroids.  It seems that he has been tested for the coronavirus on 4 occasions in the last 5 months.  All of them have been negative.  Last 1 was done 8 days ago.  He was tested before and after he went to visit his family in [place].  He has never been symptomatic.  Asthma/Respiratory Symptom History: He did use up the sample of Breztri.  Unfortunately, it was not covered by his insurance.  He did not feel that it provided any better control of his symptoms than the Symbicort.  He liked  the Symbicort because it actually was free when he was using it.  However, at this point, he is just on [place] as well as Spiriva 2 puffs once daily.  This combination seems to be working fairly well.  The Spiriva is nearly $50 a month, however.  He has not tried getting off the bed states he felt that he needed to be on something.  He is not interested in restarting Symbicort.  He is open to [place] once it is covered by his insurance.  The Fasenra injections have been going  well and he feels that they are preventing the need for prednisone.  His last prednisone was about a month or 2 before he started [place].  Allergic Rhinitis Symptom History: He is using a nose spray, but not consistently.  He has not required any antibiotics.  He denies any sinus pain or pressure.  Otherwise, there have been no changes to his past medical history, surgical history, family history, or social history.    Review of Systems  Constitutional: Negative.  Negative for chills, fever, malaise/fatigue and weight loss.  HENT: Negative.  Negative for congestion, ear discharge, ear pain and sore throat.   Eyes: Negative for pain, discharge and redness.  Respiratory: Negative for cough, sputum production, shortness of breath and wheezing.   Cardiovascular: Negative.  Negative for chest pain and palpitations.  Gastrointestinal: Negative for abdominal pain, constipation, diarrhea, heartburn, nausea and vomiting.  Skin: Negative.  Negative for itching and rash.  Neurological: Negative for dizziness and headaches.  Endo/Heme/Allergies: Negative for environmental allergies. Does not bruise/bleed easily.       Objective:   Blood pressure 126/68, pulse 78, temperature 98.1 F (36.7 C), temperature source Temporal, resp. rate 18, SpO2 96 %. There is no height or weight on file to calculate BMI.   Physical Exam:  Physical Exam  Constitutional: He appears well-developed.  Pleasant male.  Cooperative with the exam.  HENT:  Head: Normocephalic and atraumatic.  Right Ear: Tympanic membrane, external ear and ear canal normal.  Left Ear: Tympanic membrane, external ear and ear canal normal.  Nose: Mucosal edema present. No rhinorrhea, nasal deformity or septal deviation. No epistaxis. Right sinus exhibits no maxillary sinus tenderness and no frontal sinus tenderness. Left sinus exhibits no maxillary sinus tenderness and no frontal sinus tenderness.  Mouth/Throat: Uvula is midline and  oropharynx is clear and moist. Mucous membranes are not pale and not dry.  Eyes: Pupils are equal, round, and reactive to light. Conjunctivae and EOM are normal. Right eye exhibits no chemosis and no discharge. Left eye exhibits no chemosis and no discharge. Right conjunctiva is not injected. Left conjunctiva is not injected.  Cardiovascular: Normal rate, regular rhythm and normal heart sounds.  Respiratory: Effort normal and breath sounds normal. No accessory muscle usage. No tachypnea. No respiratory distress. He has no wheezes. He has no rhonchi. He has no rales. He exhibits no tenderness.  Breathing comfortably.  No wheezing or crackles noted  Lymphadenopathy:    He has no cervical adenopathy.  Neurological: He is alert.  Skin: No abrasion, no petechiae and no rash noted. Rash is not papular, not vesicular and not urticarial. No erythema. No pallor.  No eczematous or urticarial lesions noted.   Psychiatric: He has a normal mood and affect.     Diagnostic studies:    Spirometry: results normal (FEV1: 3.83/118%, FVC: 5.01/112%, FEV1/FVC: 76%).    Spirometry consistent with normal pattern.   Allergy Studies: none  Salvatore Marvel, MD  [facility]

## 2019-11-12 NOTE — Patient Instructions (Addendum)
1. Moderate persistent asthma without complication - Lung function looks excellent today. - We are not going to make any big changes since you are stable.  - Stop the Spiriva for now and use Incruse one puff daily until they are gone (this is in the same class as Spiriva).  - I will be honest that this is not a traditional regimen, but if it is making your symptoms stable and decreasing your prednisone exposure, this is a good thing.  - Daily controller medication(s): Incruse one puff once daily and Singulair 10mg  daily and Fasenra (monthly for three doses and then every 8 weeks thereafter) - Prior to physical activity: ProAir 2 puffs 10-15 minutes before physical activity. - Rescue medications: ProAir 4 puffs every 4-6 hours as needed - Asthma control goals:  * Full participation in all desired activities (may need albuterol before activity) * Albuterol use two time or less a week on average (not counting use with activity) * Cough interfering with sleep two time or less a month * Oral steroids no more than once a year * No hospitalizations  2. Non-allergic rhinitis - We will not make any medication changes at all.  - Continue with: Flonase (fluticasone) one spray per nostril daily  3. Return in about 6 months (around 05/11/2020). This can be an in-person, a virtual Webex or a telephone follow up visit.   Please inform 07/12/2020 of any Emergency Department visits, hospitalizations, or changes in symptoms. Call us before going to the ED for breathing or allergy symptoms since we might be able to fit you in for a sick visit. Feel free to contact us anytime with any questions, problems, or concerns.  It was a pleasure to see you again today!  Websites that have reliable patient information: 1. American Academy of Asthma, Allergy, and Immunology: www.aaaai.org 2. Food Allergy Research and Education (FARE): foodallergy.org 3. Mothers of Asthmatics: http://www.asthmacommunitynetwork.org 4. American  College of Allergy, Asthma, and Immunology: www.acaai.org  "Like" [place] on Facebook and Instagram for our latest updates!        Make sure you are registered to vote! If you have moved or changed any of your contact information, you will need to get this updated before voting!  In some cases, you MAY be able to register to vote online: [place]

## 2019-11-22 ENCOUNTER — Other Ambulatory Visit: Payer: Self-pay | Admitting: Allergy & Immunology

## 2019-12-08 ENCOUNTER — Encounter: Payer: Self-pay | Admitting: Family Medicine

## 2019-12-16 ENCOUNTER — Encounter: Payer: Self-pay | Admitting: Allergy & Immunology

## 2019-12-19 ENCOUNTER — Other Ambulatory Visit: Payer: Self-pay | Admitting: Allergy & Immunology

## 2020-01-06 ENCOUNTER — Encounter: Payer: Self-pay | Admitting: Allergy & Immunology

## 2020-01-09 ENCOUNTER — Ambulatory Visit: Payer: BC Managed Care – PPO | Attending: Internal Medicine

## 2020-01-09 DIAGNOSIS — Z23 Encounter for immunization: Secondary | ICD-10-CM | POA: Insufficient documentation

## 2020-01-09 NOTE — Progress Notes (Signed)
   Covid-19 Vaccination Clinic  Name:  Erik Ellis    MRN: [id number] DOB: October 02, 1966  01/09/2020  [name] was observed post Covid-19 immunization for 15 minutes without incident. He was provided with Vaccine Information Sheet and instruction to access the V-Safe system.   [name] was instructed to call 911 with any severe reactions post vaccine: Marland Kitchen Difficulty breathing  . Swelling of face and throat  . A fast heartbeat  . A bad rash all over body  . Dizziness and weakness   Immunizations Administered    Name Date Dose VIS Date Route   Pfizer COVID-19 Vaccine 01/09/2020  4:02 PM 0.3 mL 10/15/2019 Intramuscular   Manufacturer: [employer], [employer]   Lot: [id number]   NDC: [address]-6

## 2020-01-30 ENCOUNTER — Ambulatory Visit: Payer: BC Managed Care – PPO | Attending: Internal Medicine

## 2020-01-30 DIAGNOSIS — Z23 Encounter for immunization: Secondary | ICD-10-CM

## 2020-01-30 NOTE — Progress Notes (Signed)
   Covid-19 Vaccination Clinic  Name:  Erik Ellis    MRN: [id number] DOB: 1966/06/12  01/30/2020  Erik Ellis was observed post Covid-19 immunization for 15 minutes without incident. He was provided with Vaccine Information Sheet and instruction to access the V-Safe system.   Erik Ellis was instructed to call 911 with any severe reactions post vaccine: Marland Kitchen Difficulty breathing  . Swelling of face and throat  . A fast heartbeat  . A bad rash all over body  . Dizziness and weakness   Immunizations Administered    Name Date Dose VIS Date Route   Pfizer COVID-19 Vaccine 01/30/2020  2:05 PM 0.3 mL 10/15/2019 Intramuscular   Manufacturer: [employer], [employer]   Lot: [id number]   NDC: [address]-1

## 2020-02-10 ENCOUNTER — Encounter: Payer: Self-pay | Admitting: Allergy & Immunology

## 2020-02-12 ENCOUNTER — Encounter: Payer: Self-pay | Admitting: Allergy & Immunology

## 2020-02-14 ENCOUNTER — Encounter (HOSPITAL_COMMUNITY): Payer: Self-pay | Admitting: Emergency Medicine

## 2020-02-14 ENCOUNTER — Ambulatory Visit (HOSPITAL_COMMUNITY)
Admission: EM | Admit: 2020-02-14 | Discharge: 2020-02-14 | Disposition: A | Discharge status: Home / Self Care | Payer: BC Managed Care – PPO | Attending: Family Medicine | Admitting: Family Medicine

## 2020-02-14 ENCOUNTER — Encounter: Payer: Self-pay | Admitting: Allergy & Immunology

## 2020-02-14 ENCOUNTER — Other Ambulatory Visit: Payer: Self-pay

## 2020-02-14 ENCOUNTER — Other Ambulatory Visit: Payer: Self-pay | Admitting: *Deleted

## 2020-02-14 DIAGNOSIS — J4521 Mild intermittent asthma with (acute) exacerbation: Secondary | ICD-10-CM | POA: Diagnosis not present

## 2020-02-14 MED ORDER — PREDNISONE 10 MG PO TABS: 40.0000 mg | ORAL_TABLET | Freq: Every day | ORAL | 0 refills | Status: DC

## 2020-02-14 MED ORDER — FLOVENT HFA 220 MCG/ACT IN AERO: 2.0000 | INHALATION_SPRAY | Freq: Two times a day (BID) | RESPIRATORY_TRACT | 5 refills | Status: DC | PRN

## 2020-02-14 NOTE — Telephone Encounter (Signed)
Patient states the inhaler cost over $140, he is wondering can we send in something else cheaper?  Thanks

## 2020-02-14 NOTE — Discharge Instructions (Addendum)
Take the prednisone as prescribed  Use the inhaler as needed  Follow up as needed for continued or worsening symptoms

## 2020-02-14 NOTE — ED Provider Notes (Signed)
[facility]    CSN: [id number] Arrival date & time: 02/14/20  1140      History   Chief Complaint Chief Complaint  Patient presents with  . Asthma    HPI Erik Ellis is a 54 y.o. male.   Patient is a 54 year old male presents today with asthma exacerbation.  This is been present for 3 days.  Has been using his steroid inhaler and rescue inhaler without much relief.  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  Denies any history of allergies or taking allergy medication.  No cough, chest congestion, fever, chills, body aches, nasal congestion or rhinorrhea.  ROS per HPI      Past Medical History:  Diagnosis Date  . Asthma     Patient Active Problem List   Diagnosis Date Noted  . Lithium use 10/04/2019  . 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 Mild renal insufficiency 10/04/2019  . Acute intractable headache 07/03/2016  . Elevated serum creatinine 05/02/2016  . Allergic rhinitis 02/17/2016  . Asthma with acute exacerbation 05/31/2015  . Bipolar disorder ([facility]) 05/31/2015  . Esophageal reflux 05/31/2015  . Asthma, mild persistent 04/27/2013  . Diverticulosis 09/02/2012  . H/O colonoscopy with polypectomy 09/02/2012  . Sleep disorder 12/04/2011    Past Surgical History:  Procedure Laterality Date  . CHOLECYSTECTOMY    . KNEE ARTHROSCOPY    . LUMBAR DISC SURGERY    . NECK SURGERY    . REPLACEMENT TOTAL KNEE         Home Medications    Prior to Admission medications   Medication Sig Start Date End Date Taking? Authorizing Provider  albuterol (VENTOLIN HFA) 108 (90 Base) MCG/ACT inhaler Inhale 2 puffs into the lungs every 4 (four) hours as needed. For shortness of breath/wheezing 08/04/19   Merlyn Albert, MD  amphetamine-dextroamphetamine (ADDERALL) 10 MG tablet  07/24/16   [provider]  buPROPion (WELLBUTRIN XL) 300 MG 24 hr tablet Take 1 tablet by mouth daily. 07/27/14   [provider]  busPIRone (BUSPAR) 15 MG tablet Take 30 mg by mouth 2 (two) times daily. 07/24/14   [provider]  EPINEPHrine 0.3 mg/0.3 mL IJ SOAJ injection Inject 0.3 mLs (0.3 mg total) into the muscle once as needed for anaphylaxis. 10/21/19   Padgett, Pilar Grammes, MD  FASENRA PEN 30 MG/ML SOAJ INJECT 1 PEN UNDER THE SKIN AT WEEK 0, 4 AND 8, THEN INJECT 1 PEN EVERY 8 WEEKS THEREAFTER. 11/22/19   Alfonse Spruce, MD  fluticasone [facility]) 50 MCG/ACT nasal spray Place 2 sprays into both nostrils daily. 06/01/19 07/01/19  Alfonse Spruce, MD  fluticasone (FLOVENT HFA) 220 MCG/ACT inhaler Inhale 2 puffs into the lungs 2 (two) times daily as needed. 02/14/20   Alfonse Spruce, MD  gabapentin (NEURONTIN) 300 MG capsule Take 300 mg by mouth at bedtime. 03/24/16   [provider]  lamoTRIgine (LAMICTAL) 200 MG tablet Take 200 mg by mouth 2 (two) times daily. 08/14/14   [provider]  lithium carbonate (LITHOBID) 300 MG CR tablet Take 1,200 mg by mouth at bedtime. 07/06/14   [provider]  montelukast (SINGULAIR) 10 MG tablet Take 1 tablet (10 mg total) by mouth at bedtime. 11/11/19   Alfonse Spruce, MD  pantoprazole (PROTONIX) 40 MG tablet Take 1 tablet (40 mg total) by mouth daily. 08/04/19   Merlyn Albert, MD  predniSONE (DELTASONE) 10 MG tablet Take 4 tablets (40  mg total) by mouth daily for 5 days. 02/14/20 02/19/20  Loura Halt A, NP  sildenafil (VIAGRA) 50 MG tablet TAKE 1 TABLET BY MOUTH AS NEEDED AN HOUR BEFORE SEX. DO NOT TAKE MORE THAN 1 TIME IN 24 HOURS. THIS IS NOT A DAILY MEDICINE. 09/16/18   [provider]  SPIRIVA RESPIMAT 1.25 MCG/ACT AERS INHALE 2 PUFFS BY MOUTH INTO THE LUNGS DAILY 12/20/19   Valentina Shaggy, MD  XIIDRA 5 % SOLN INSTILL 1 DROP INTO BOTH EYES TWICE A DAY 2 MINUTE LID CLOSURE AFTER INSTILLING 08/13/18   [provider]  zolpidem (AMBIEN) 10 MG tablet Take 5-10 mg by mouth at bedtime.   07/26/14   [provider]    Family History Family History  Problem Relation Age of Onset  . Asthma Father   . Allergic rhinitis Neg Hx   . Angioedema Neg Hx   . Atopy Neg Hx   . Eczema Neg Hx   . Immunodeficiency Neg Hx   . Urticaria Neg Hx     Social History Social History   Tobacco Use  . Smoking status: Never Smoker  . Smokeless tobacco: Never Used  Substance Use Topics  . Alcohol use: No  . Drug use: No     Allergies   Patient has no known allergies.   Review of Systems Review of Systems   Physical Exam Triage Vital Signs ED Triage Vitals  Enc Vitals Group     BP 02/14/20 1240 (!) 158/80     Pulse Rate 02/14/20 1240 77     Resp 02/14/20 1240 18     Temp 02/14/20 1240 98.2 F (36.8 C)     Temp Source 02/14/20 1240 Oral     SpO2 02/14/20 1240 98 %     Weight --      Height --      Head Circumference --      Peak Flow --      Pain Score 02/14/20 1242 0     Pain Loc --      Pain Edu? --      Excl. in [place]? --    No data found.  Updated Vital Signs BP (!) 158/80 (BP Location: Right Arm)   Pulse 77   Temp 98.2 F (36.8 C) (Oral)   Resp 18   SpO2 98%   Visual Acuity Right Eye Distance:   Left Eye Distance:   Bilateral Distance:    Right Eye Near:   Left Eye Near:    Bilateral Near:     Physical Exam Vitals and nursing note reviewed.  Constitutional:      Appearance: Normal appearance.  HENT:     Head: Normocephalic and atraumatic.     Nose: Nose normal.  Eyes:     Conjunctiva/sclera: Conjunctivae normal.  Cardiovascular:     Rate and Rhythm: Normal rate and regular rhythm.  Pulmonary:     Effort: Pulmonary effort is normal. No respiratory distress.     Breath sounds: Normal breath sounds. No stridor. No wheezing, rhonchi or rales.  Chest:     Chest wall: No tenderness.  Abdominal:     Palpations: Abdomen is soft.  Musculoskeletal:        General: Normal range of motion.     Cervical back: Normal range of motion.    Skin:    General: Skin is warm and dry.  Neurological:     Mental Status: He is alert.  Psychiatric:  Mood and Affect: Mood normal.      UC Treatments / Results  Labs (all labs ordered are listed, but only abnormal results are displayed) Labs Reviewed - No data to display  EKG   Radiology No results found.  Procedures Procedures (including critical care time)  Medications Ordered in UC Medications - No data to display  Initial Impression / Assessment and Plan / UC Course  I have reviewed the triage vital signs and the nursing notes.  Pertinent labs & imaging results that were available during my care of the patient were reviewed by me and considered in my medical decision making (see chart for details).     Mild intermittent asthma with exacerbation.  Either weather or allergy induced.  Will prescribe prednisone daily for 5 days Rescue inhaler as needed. Follow up as needed for continued or worsening symptoms  Final Clinical Impressions(s) / UC Diagnoses   Final diagnoses:  Mild intermittent asthma with exacerbation     Discharge Instructions     Take the prednisone as prescribed  Use the inhaler as needed  Follow up as needed for continued or worsening symptoms     ED Prescriptions    Medication Sig Dispense Auth. Provider   predniSONE (DELTASONE) 10 MG tablet Take 4 tablets (40 mg total) by mouth daily for 5 days. 20 tablet Dahlia Byes A, NP     PDMP not reviewed this encounter.   Janace Aris, NP 02/14/20 1352

## 2020-02-14 NOTE — ED Triage Notes (Signed)
Pt here for asthma sx worse x 3 days; pt sts home inhalers are not helping

## 2020-02-16 ENCOUNTER — Ambulatory Visit: Payer: BC Managed Care – PPO | Admitting: Family Medicine

## 2020-02-17 ENCOUNTER — Ambulatory Visit: Payer: BC Managed Care – PPO | Admitting: Allergy & Immunology

## 2020-02-18 ENCOUNTER — Encounter: Payer: Self-pay | Admitting: Family Medicine

## 2020-02-18 ENCOUNTER — Other Ambulatory Visit: Payer: Self-pay | Admitting: Allergy & Immunology

## 2020-02-18 ENCOUNTER — Other Ambulatory Visit: Payer: Self-pay

## 2020-02-18 ENCOUNTER — Ambulatory Visit (INDEPENDENT_AMBULATORY_CARE_PROVIDER_SITE_OTHER): Payer: BC Managed Care – PPO | Admitting: Family Medicine

## 2020-02-18 VITALS — BP 118/84 | HR 95 | Temp 97.9°F | Wt 172.2 lb

## 2020-02-18 DIAGNOSIS — J4521 Mild intermittent asthma with (acute) exacerbation: Secondary | ICD-10-CM

## 2020-02-18 MED ORDER — QVAR REDIHALER 80 MCG/ACT IN AERB: 2.0000 | INHALATION_SPRAY | Freq: Two times a day (BID) | RESPIRATORY_TRACT | 4 refills | Status: DC

## 2020-02-18 MED ORDER — METHYLPREDNISOLONE ACETATE 40 MG/ML IJ SUSP
40.0000 mg | Freq: Once | INTRAMUSCULAR | Status: AC
  Administered 2020-02-18: 40 mg via INTRAMUSCULAR

## 2020-02-18 MED ORDER — PREDNISONE 20 MG PO TABS: ORAL_TABLET | ORAL | 0 refills | Status: DC

## 2020-02-18 NOTE — Progress Notes (Signed)
   Subjective:    Patient ID: Erik Ellis, male    DOB: Nov 24, 1965, 54 y.o.   MRN: [id number]  HPI Patient comes in today with complaints of SOB x 10 days. Patient went to urgent care given short course of prednisone helped a little bit patient with coughing congestion feeling tight in the lungs denies high fever chills denies sweats nausea vomiting diarrhea. Denies any fevers. Denies Covid symptoms. Patient went to urgent care Monday and was given prednisone which he finished today.  Patient is not feeling any better and it may be getting worse.  He relates he has had a severe time with allergies Review of Systems See above    Objective:   Physical Exam Bronchial cough with intermittent wheezing No respiratory distress able to talk freely HEENT benign      Assessment & Plan:  Reactive airway Prednisone taper Shot of steroids Albuterol frequently Qvar written because he cannot afford to get Flovent. Follow-up if ongoing troubles warning signs discussed go to urgent care or ER if worse over the weekend

## 2020-05-11 NOTE — Progress Notes (Signed)
[address] [place] [address] Dept: [phone]  FOLLOW UP NOTE  Patient ID: Erik Ellis, male    DOB: 1966-01-23  Age: 54 y.o. MRN: [id number] Date of Office Visit: 05/12/2020  Assessment  Chief Complaint: Follow-up and Asthma  HPI Erik Ellis is a 54 year old male who presents to the clinic for follow-up visit.  He was last seen in this clinic on 11/12/2019 by Dr. Dellis Anes for evaluation of asthma, nonallergic rhinitis, and reflux.  In the interim, he reports that he had an asthma attack in April with symptoms including shortness of breath, chest tightness, and dry cough for which he went to urgent care and received prednisone taper followed by a visit to Dr. Fletcher Anon office for a steroid injection, prednisone taper, and the addition of Qvar for asthma maintenance.  At today's visit he reports that his asthma has been well controlled with no shortness of breath, cough, or wheeze with activity or rest.  He continues montelukast 10 mg once a day, Qvar 80-2 puffs twice a day, Spiriva 1.25 mcg 2 puffs once a day, and Fasenra 30 mg once every 8 weeks.  He reports allergic rhinitis has been well controlled with occasional clear rhinorrhea and postnasal drainage.  He continues Flonase, however, he reports poor application technique.  Reflux is reported as well controlled with no symptoms including heartburn or vomiting.  He continues pantoprazole 40 mg once a day.  His current medications are listed in the chart.   Drug Allergies:  No Known Allergies  Physical Exam: BP 126/80 (BP Location: Right Arm, Patient Position: Sitting, Cuff Size: Normal)   Pulse 86   Temp 98.7 F (37.1 C) (Temporal)   Resp 16   Wt 174 lb 9.6 oz (79.2 kg)   SpO2 96%   BMI 26.94 kg/m    Physical Exam Vitals reviewed.  Constitutional:      Appearance: Normal appearance.  HENT:     Head: Normocephalic and atraumatic.     Right Ear: Tympanic membrane normal.     Left Ear: Tympanic  membrane normal.     Nose:     Comments: Bilateral nares slightly erythematous with no nasal drainage noted.  Pharynx normal.  Ears normal.  Eyes normal.    Mouth/Throat:     Pharynx: Oropharynx is clear.  Eyes:     Conjunctiva/sclera: Conjunctivae normal.  Cardiovascular:     Rate and Rhythm: Normal rate and regular rhythm.     Heart sounds: Normal heart sounds. No murmur heard.   Pulmonary:     Effort: Pulmonary effort is normal.     Breath sounds: Normal breath sounds.     Comments: Lungs clear to auscultation Musculoskeletal:        General: Normal range of motion.     Cervical back: Normal range of motion and neck supple.  Skin:    General: Skin is warm and dry.  Neurological:     Mental Status: He is alert and oriented to person, place, and time.  Psychiatric:        Mood and Affect: Mood normal.        Behavior: Behavior normal.        Thought Content: Thought content normal.        Judgment: Judgment normal.     Diagnostics: Equipment malfunctioned and we are unable to perform spirometry today  Assessment and Plan: 1. Moderate persistent asthma without complication   2. Non-allergic rhinitis   3. Gastroesophageal reflux disease, unspecified  whether esophagitis present     Meds ordered this encounter  Medications  . EPINEPHrine 0.3 mg/0.3 mL IJ SOAJ injection    Sig: Inject 0.3 mLs (0.3 mg total) into the muscle once as needed for anaphylaxis.    Dispense:  2 each    Refill:  1    Please dispense M[employer] brand generic only. Thank you.    Patient Instructions  Asthma Continue montelukast 10 mg once a day to prevent cough or wheeze Continue Qvar 80-2 puffs twice a day to prevent cough or wheeze Continue Spiriva 1.25 mcg-2 puffs once a day to prevent cough and wheeze  Continue albuterol 2 puffs every 4 hours as needed for cough or wheeze Continue Fasenra injections and have access to an epinephrine auto-injector set Spirometry at follow-up  visit  Non-allergic rhinitis Continue Flonase 2 sprays in each nostril once a day as needed for stuffy nose.  In the right nostril, point the applicator out toward the right ear. In the left nostril, point the applicator out toward the left ear Consider saline nasal rinses as needed for nasal symptoms. Use this before any medicated nasal sprays for best result  Reflux Continue dietary and lifestyle modifications as listed below Continue pantoprazole 40 mg once a day as previously prescribed  Call the clinic if this treatment plan is not working well for you  Follow up in 6 months or sooner if needed.  Return in about 6 months (around 11/12/2020), or if symptoms worsen or fail to improve.    Thank you for the opportunity to care for this patient.  Please do not hesitate to contact me with questions.  Thermon Leyland, FNP [facility]

## 2020-05-11 NOTE — Patient Instructions (Addendum)
Asthma Continue montelukast 10 mg once a day to prevent cough or wheeze Continue Qvar 80-2 puffs twice a day to prevent cough or wheeze Continue Spiriva 1.25 mcg-2 puffs once a day to prevent cough and wheeze  Continue albuterol 2 puffs every 4 hours as needed for cough or wheeze Continue Fasenra injections and have access to an epinephrine auto-injector set  Non-allergic rhinitis Continue Flonase 2 sprays in each nostril once a day as needed for stuffy nose.  In the right nostril, point the applicator out toward the right ear. In the left nostril, point the applicator out toward the left ear Consider saline nasal rinses as needed for nasal symptoms. Use this before any medicated nasal sprays for best result  Reflux Continue dietary and lifestyle modifications as listed below Continue pantoprazole 40 mg once a day as previously prescribed  Call the clinic if this treatment plan is not working well for you  Follow up in 6 months or sooner if needed.   Lifestyle Changes for Controlling GERD When you have GERD, stomach acid feels as if it's backing up toward your mouth. Whether or not you take medication to control your GERD, your symptoms can often be improved with lifestyle changes.   Raise Your Head  Reflux is more likely to strike when you're lying down flat, because stomach fluid can  flow backward more easily. Raising the head of your bed 4-6 inches can help. To do this:  Slide blocks or books under the legs at the head of your bed. Or, place a wedge under  the mattress. Many foam stores can make a suitable wedge for you. The wedge  should run from your waist to the top of your head.  Don't just prop your head on several pillows. This increases pressure on your  stomach. It can make GERD worse.  Watch Your Eating Habits Certain foods may increase the acid in your stomach or relax the lower esophageal sphincter, making GERD more likely. It's best to avoid the  following:  Coffee, tea, and carbonated drinks (with and without caffeine)  Fatty, fried, or spicy food  Mint, chocolate, onions, and tomatoes  Any other foods that seem to irritate your stomach or cause you pain  Relieve the Pressure  Eat smaller meals, even if you have to eat more often.  Don't lie down right after you eat. Wait a few hours for your stomach to empty.  Avoid tight belts and tight-fitting clothes.  Lose excess weight.  Tobacco and Alcohol  Avoid smoking tobacco and drinking alcohol. They can make GERD symptoms worse.

## 2020-05-12 ENCOUNTER — Other Ambulatory Visit: Payer: Self-pay

## 2020-05-12 ENCOUNTER — Encounter: Payer: Self-pay | Admitting: Family Medicine

## 2020-05-12 ENCOUNTER — Ambulatory Visit: Payer: BC Managed Care – PPO | Admitting: Allergy & Immunology

## 2020-05-12 ENCOUNTER — Ambulatory Visit: Payer: BC Managed Care – PPO | Admitting: Family Medicine

## 2020-05-12 VITALS — BP 126/80 | HR 86 | Temp 98.7°F | Resp 16 | Wt 174.6 lb

## 2020-05-12 DIAGNOSIS — J4540 Moderate persistent asthma, uncomplicated: Secondary | ICD-10-CM | POA: Diagnosis not present

## 2020-05-12 DIAGNOSIS — K219 Gastro-esophageal reflux disease without esophagitis: Secondary | ICD-10-CM | POA: Diagnosis not present

## 2020-05-12 DIAGNOSIS — J310 Chronic rhinitis: Secondary | ICD-10-CM | POA: Diagnosis not present

## 2020-05-12 MED ORDER — EPINEPHRINE 0.3 MG/0.3ML IJ SOAJ: 0.3000 mg | Freq: Once | INTRAMUSCULAR | 1 refills | Status: DC | PRN

## 2020-06-13 ENCOUNTER — Other Ambulatory Visit: Payer: Self-pay | Admitting: Allergy & Immunology

## 2020-06-16 ENCOUNTER — Telehealth: Payer: Self-pay

## 2020-06-16 ENCOUNTER — Encounter: Payer: Self-pay | Admitting: Allergy & Immunology

## 2020-06-16 MED ORDER — PREDNISONE 10 MG PO TABS
ORAL_TABLET | ORAL | 0 refills | Status: DC
Start: 2020-06-16 — End: 2020-07-11

## 2020-06-16 NOTE — Addendum Note (Signed)
Addended by: Dub Mikes on: 06/16/2020 04:53 PM   Modules accepted: Orders

## 2020-06-16 NOTE — Telephone Encounter (Signed)
Javier's note does not make sense. I would never recommend using Spirva three puffs twice daily.   Can someone call him and get some more information? I am confused. Please clarify what he is taking and who gave him recommendations on his inhalers.   Malachi Bonds, MD [facility]

## 2020-06-16 NOTE — Telephone Encounter (Signed)
Patient is having an asthma flare and would like to speak with a nurse. Patient states he is having some wheezing and coughing.   Can someone give him a call to discuss his asthma plan?  Thanks

## 2020-06-16 NOTE — Telephone Encounter (Signed)
Spoke to patient to get some clarification. Patient is taking Montelukast 10 mg daily, Qvar 80- 2 puffs twice a day and Spiriva 1.25 mcg- 2 puffs once a day. Patient stated he was informed to increase his Qvar 80- 3 puffs twice a day. Patient stated he was not informed to increase the Spiriva 1.25 mcg- 3 puffs twice a day, so [place] documentation is just typo. After speaking to Dr. Dellis Anes he has agreed to that plan and will add on prednisone 20 mg twice a day for 3 days, then take 20 mg on day 4, then take 10 mg on the 5th day, then stop. Patient made aware.

## 2020-06-16 NOTE — Telephone Encounter (Signed)
Patient states he his having SOB and coughing since last 3 days. Was last seen in July 2021 and was in stable conditions.Currently taking his current regimen: Continue montelukast 10 mg once a day to prevent cough or wheeze,Continue Qvar 80-2 puffs twice a day to prevent cough or wheeze Continue Spiriva 1.25 mcg-2 puffs once a day to prevent cough and wheeze Continue albuterol 2 puffs every 4 hours as needed for cough or wheeze, Continue Fasenra injections and have access to an epinephrine auto-injector set. States he normally gets two of these once a year and is not sure if it could be related to his flare up of Asthma or something else. No complaints of any flu/COVID symptoms and states he noticing this for the past several years and noticing it getting worse throughout the years.  Is not taking any antihistamines, or Mucinex to assist with his cough which he states is a dry/itch cough. Would like to know if he could get some prednisone sent to the pharmacy? He was advise to increase his inhalation of Qvar and Spiriva to 3 puff twice daily until we can get a further advise from the provider.

## 2020-06-26 ENCOUNTER — Encounter: Payer: Self-pay | Admitting: Allergy & Immunology

## 2020-06-27 ENCOUNTER — Ambulatory Visit: Payer: BC Managed Care – PPO | Admitting: Allergy & Immunology

## 2020-06-27 ENCOUNTER — Other Ambulatory Visit: Payer: Self-pay

## 2020-06-27 ENCOUNTER — Encounter: Payer: Self-pay | Admitting: Allergy & Immunology

## 2020-06-27 VITALS — BP 130/78 | HR 83 | Temp 99.6°F | Resp 16

## 2020-06-27 DIAGNOSIS — J4540 Moderate persistent asthma, uncomplicated: Secondary | ICD-10-CM | POA: Diagnosis not present

## 2020-06-27 DIAGNOSIS — K219 Gastro-esophageal reflux disease without esophagitis: Secondary | ICD-10-CM

## 2020-06-27 DIAGNOSIS — J310 Chronic rhinitis: Secondary | ICD-10-CM | POA: Diagnosis not present

## 2020-06-27 MED ORDER — TRELEGY ELLIPTA 200-62.5-25 MCG/INH IN AEPB: INHALATION_SPRAY | RESPIRATORY_TRACT | 5 refills | Status: DC

## 2020-06-27 MED ORDER — METHYLPREDNISOLONE ACETATE 80 MG/ML IJ SUSP
80.0000 mg | Freq: Once | INTRAMUSCULAR | Status: AC
  Administered 2020-06-27: 80 mg via INTRAMUSCULAR

## 2020-06-27 NOTE — Progress Notes (Signed)
FOLLOW UP  Date of Service/Encounter:  06/27/20   Assessment:   Moderate persistent asthma without complication- alpha-1 antitrpysin and Aspergillus precipitins all normal  Non-allergic rhinitis  Plan/Recommendations:   1. Moderate persistent asthma without complication - Lung function looks lackluster today. - Steroid injection given today. - Start the prednisone pack provided as well. - STOP the Qvar and the Spiriva. - Start Trelegy one puff once daily (contains three medicines that can help control your asthma). - Copay card provided.  - I am going to review your prednisone use over the past year or so to see if the [place] Challenger is doing much of anything (our goal is to DECREASE prednisone). - We have other medications we could try as well.  - Daily controller medication(s): Singulair 10mg  daily and Trelegy 200/62.5/25 one puff once daily and Fasenra (every 8 weeks) - Prior to physical activity: ProAir 2 puffs 10-15 minutes before physical activity. - Rescue medications: ProAir 4 puffs every 4-6 hours as needed - Asthma control goals:  * Full participation in all desired activities (may need albuterol before activity) * Albuterol use two time or less a week on average (not counting use with activity) * Cough interfering with sleep two time or less a month * Oral steroids no more than once a year * No hospitalizations  2. Non-allergic rhinitis - We will not make any medication changes at all.  - Continue with: Flonase (fluticasone) one spray per nostril daily   3. Return in about 3 months (around 09/27/2020). This can be an in-person, a virtual Webex or a telephone follow up visit.  Subjective:   Erik Ellis is a 54 y.o. male presenting today for follow up of  Chief Complaint  Patient presents with  . Asthma    Erik Ellis has a history of the following: Patient Active Problem List   Diagnosis Date Noted  . Moderate persistent asthma without complication  05/12/2020  . Non-allergic rhinitis 05/12/2020  . Lithium use 10/04/2019  . 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 Mild renal insufficiency 10/04/2019  . Acute intractable headache 07/03/2016  . Elevated serum creatinine 05/02/2016  . Allergic rhinitis 02/17/2016  . Asthma with acute exacerbation 05/31/2015  . Bipolar disorder (HCC) 05/31/2015  . Gastroesophageal reflux disease 05/31/2015  . Asthma, mild persistent 04/27/2013  . Diverticulosis 09/02/2012  . H/O colonoscopy with polypectomy 09/02/2012  . Sleep disorder 12/04/2011    History obtained from: chart review and patient.  Erik Ellis is a 54 y.o. male presenting for a follow up visit.  He was last seen for regular follow-up visit in July 2021.  At that time, he was doing very well.  He was continued on montelukast as well as Qvar 80 mcg 2 puffs twice daily, Spiriva 1.25 mcg 2 puffs once daily, albuterol 2 puffs every 4-6 hours as needed, as well as Fasenra.  For his nonallergic rhinitis, we will continue with Flonase as well as nasal saline rinses.  He was also continued on pantoprazole 40 mg daily.  In the interim, he called and was reporting worsening symptoms.  We added on a prednisone burst. He resolved very quickly and then he had a worsening of his symptoms, even worse tha nit was before. He has worsening when he is lying. He remains on pantoprazole and he takes the regularly.   He has not had a fever. He is fully vaccinated August 2021) and he has been doing football all summer. 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 He is unsure of the vaccination status of his coworkers.   He remains on the Qvar 2 puffs twice daily as well as Spiriva 2 puffs once daily.  I do not remember why we are doing this regimen, but I think it has to do with the cost of the medications.  He tells me he takes these medications every day.  He pays $49 per inhaler.  He thinks the Harrington Challenger is  working, but it is really hard to gauge how often he is getting prednisone.  It should be noted that he was given prednisone over 6 weeks prior to [place] seeing him.  From a rhinitis standpoint, he is doing fine with the current medications including Flonase and nasal saline rinses.  He remains on the pantoprazole 40 mg every single day.  Otherwise, there have been no changes to his past medical history, surgical history, family history, or social history.    Review of Systems  Constitutional: Negative.  Negative for chills, fever, malaise/fatigue and weight loss.  HENT: Negative for congestion, ear discharge, ear pain and sinus pain.   Eyes: Negative for pain, discharge and redness.  Respiratory: Positive for cough and shortness of breath. Negative for sputum production and wheezing.   Cardiovascular: Negative.  Negative for chest pain and palpitations.  Gastrointestinal: Negative for abdominal pain, constipation, diarrhea, heartburn, nausea and vomiting.  Skin: Negative.  Negative for itching and rash.  Neurological: Negative for dizziness and headaches.  Endo/Heme/Allergies: Negative for environmental allergies. Does not bruise/bleed easily.       Objective:   Blood pressure 130/78, pulse 83, temperature 99.6 F (37.6 C), temperature source Temporal, resp. rate 16, SpO2 97 %. There is no height or weight on file to calculate BMI.   Physical Exam:  Physical Exam Constitutional:      Appearance: He is well-developed.     Comments: Pleasant male.  Very talkative.  HENT:     Head: Normocephalic and atraumatic.     Right Ear: Tympanic membrane, ear canal and external ear normal.     Left Ear: Tympanic membrane and ear canal normal.     Nose: No nasal deformity, septal deviation, mucosal edema or rhinorrhea.     Right Sinus: No maxillary sinus tenderness or frontal sinus tenderness.     Left Sinus: No maxillary sinus tenderness or frontal sinus tenderness.     Mouth/Throat:      Mouth: Mucous membranes are not pale and not dry.     Pharynx: Uvula midline.  Eyes:     General:        Right eye: No discharge.        Left eye: No discharge.     Conjunctiva/sclera: Conjunctivae normal.     Right eye: Right conjunctiva is not injected. No chemosis.    Left eye: Left conjunctiva is not injected. No chemosis.    Pupils: Pupils are equal, round, and reactive to light.  Cardiovascular:     Rate and Rhythm: Normal rate and regular rhythm.     Heart sounds: Normal heart sounds.  Pulmonary:     Effort: Pulmonary effort is normal. No tachypnea, accessory muscle usage or respiratory distress.     Breath sounds: Normal breath sounds. No wheezing, rhonchi or rales.     Comments: Decreased air movement at the bases.  Rhonchi throughout. Chest:     Chest wall: No tenderness.  Lymphadenopathy:     Cervical:  No cervical adenopathy.  Skin:    Coloration: Skin is not pale.     Findings: No abrasion, erythema, petechiae or rash. Rash is not papular, urticarial or vesicular.  Neurological:     Mental Status: He is alert.      Diagnostic studies:    Spirometry: results normal (FEV1: 3.52/105%, FVC: 4.22/96%, FEV1/FVC: 83%).    Spirometry consistent with normal pattern. FVL was somewhat wonky due to coughing.  "} Allergy Studies: none        Malachi Bonds, MD  [facility]

## 2020-06-27 NOTE — Patient Instructions (Addendum)
1. Moderate persistent asthma without complication - Lung function looks lackluster today. - Steroid injection given today. - Start the prednisone pack provided as well. - STOP the Qvar and the Spiriva. - Start Trelegy one puff once daily (contains three medicines that can help control your asthma). - Copay card provided.  - I am going to review your prednisone use over the past year or so to see if the [place] Challenger is doing much of anything (our goal is to DECREASE prednisone). - We have other medications we could try as well.  - Daily controller medication(s): Singulair 10mg  daily and Trelegy 200/62.5/25 one puff once daily and Fasenra (every 8 weeks) - Prior to physical activity: ProAir 2 puffs 10-15 minutes before physical activity. - Rescue medications: ProAir 4 puffs every 4-6 hours as needed - Asthma control goals:  * Full participation in all desired activities (may need albuterol before activity) * Albuterol use two time or less a week on average (not counting use with activity) * Cough interfering with sleep two time or less a month * Oral steroids no more than once a year * No hospitalizations  2. Non-allergic rhinitis - We will not make any medication changes at all.  - Continue with: Flonase (fluticasone) one spray per nostril daily   3. Return in about 3 months (around 09/27/2020). This can be an in-person, a virtual Webex or a telephone follow up visit.   Please inform 09/29/2020 of any Emergency Department visits, hospitalizations, or changes in symptoms. Call us before going to the ED for breathing or allergy symptoms since we might be able to fit you in for a sick visit. Feel free to contact us anytime with any questions, problems, or concerns.  It was a pleasure to see you again today!  Websites that have reliable patient information: 1. American Academy of Asthma, Allergy, and Immunology: www.aaaai.org 2. Food Allergy Research and Education (FARE): foodallergy.org 3. Mothers  of Asthmatics: http://www.asthmacommunitynetwork.org 4. American College of Allergy, Asthma, and Immunology: www.acaai.org   COVID-19 Vaccine Information can be found at: [place] For questions related to vaccine distribution or appointments, please email [email or web address] or call [phone].     "Like" [phone] on Facebook and Instagram for our latest updates!        Make sure you are registered to vote! If you have moved or changed any of your contact information, you will need to get this updated before voting!  In some cases, you MAY be able to register to vote online: [place]

## 2020-06-29 ENCOUNTER — Encounter: Payer: Self-pay | Admitting: Allergy & Immunology

## 2020-06-30 ENCOUNTER — Other Ambulatory Visit: Payer: BC Managed Care – PPO

## 2020-06-30 ENCOUNTER — Other Ambulatory Visit: Payer: Self-pay | Admitting: Sleep Medicine

## 2020-06-30 ENCOUNTER — Encounter: Payer: Self-pay | Admitting: Allergy & Immunology

## 2020-06-30 DIAGNOSIS — Z20822 Contact with and (suspected) exposure to covid-19: Secondary | ICD-10-CM

## 2020-06-30 MED ORDER — PREDNISONE 10 MG PO TABS: ORAL_TABLET | ORAL | 0 refills | Status: DC

## 2020-07-02 ENCOUNTER — Encounter: Payer: Self-pay | Admitting: Family Medicine

## 2020-07-02 ENCOUNTER — Encounter: Payer: Self-pay | Admitting: Allergy & Immunology

## 2020-07-02 LAB — NOVEL CORONAVIRUS, NAA: SARS-CoV-2, NAA: DETECTED — AB

## 2020-07-02 LAB — SPECIMEN STATUS REPORT

## 2020-07-02 LAB — SARS-COV-2, NAA 2 DAY TAT

## 2020-07-03 ENCOUNTER — Other Ambulatory Visit: Payer: Self-pay | Admitting: Physician Assistant

## 2020-07-03 ENCOUNTER — Encounter: Payer: Self-pay | Admitting: Family Medicine

## 2020-07-03 ENCOUNTER — Telehealth (INDEPENDENT_AMBULATORY_CARE_PROVIDER_SITE_OTHER): Payer: BC Managed Care – PPO | Admitting: Family Medicine

## 2020-07-03 ENCOUNTER — Telehealth: Payer: Self-pay

## 2020-07-03 ENCOUNTER — Telehealth: Payer: Self-pay | Admitting: Family Medicine

## 2020-07-03 DIAGNOSIS — J4541 Moderate persistent asthma with (acute) exacerbation: Secondary | ICD-10-CM | POA: Diagnosis not present

## 2020-07-03 DIAGNOSIS — U071 COVID-19: Secondary | ICD-10-CM | POA: Diagnosis not present

## 2020-07-03 MED ORDER — CHERATUSSIN AC 100-10 MG/5ML PO SOLN: 5.0000 mL | Freq: Three times a day (TID) | ORAL | 0 refills | Status: DC | PRN

## 2020-07-03 NOTE — Telephone Encounter (Signed)
Patient is in the process of buying a Pulse/Oxymetry as we speak also will give the monoclonal antibodies patient line a call to discuss further treatments. Patient verbalized understanding and will call back if things worsen.

## 2020-07-03 NOTE — Telephone Encounter (Signed)
Patient scheduled virtual visit today at 4:10pm with Dr Ladona Ridgel

## 2020-07-03 NOTE — Progress Notes (Signed)
Patient ID: Erik Ellis, male    DOB: Aug 22, 1966, 54 y.o.   MRN: [id number]  Virtual Visit via Video Note  I connected with Erik Ellis on 07/03/20 at  1:10 PM EDT by a video enabled telemedicine application and verified that I am speaking with the correct person using two identifiers.  Location: Patient: home Provider: office   I discussed the limitations of evaluation and management by telemedicine and the availability of in person appointments. The patient expressed understanding and agreed to proceed. Chief Complaint  Patient presents with  . Sore Throat   Subjective:    HPI Pt tested positive for COVID 1 day ago; started having symptoms 6 days ago; pt tested for covid 3 days ago.   Pt is a Banker at [employer].  Pt has had low grade fever, sore throat, runny nose in beginning.  Now just having bad cough at night.  Pt has history of asthma. Has been taking Prednisone for asthma. Taking Tylenol for symptoms.  Pt called his Allergist, Dr. Dellis Anes and was given prednisone for what they thought was an asthma exacerbation.  Last week wasn't getting better and more coughing, he called Dr. Dellis Anes again and given another taper of prednisone 30mg  taper.  Pt stating still having a night time cough.  Taking all his inhalers as directed.  Not sob during the day.  Dry coughing.  Mostly at night. No fever.  Has runny nose still.  Medical History Kahle has a past medical history of Asthma.   Outpatient Encounter Medications as of 07/03/2020  Medication Sig  . albuterol (VENTOLIN HFA) 108 (90 Base) MCG/ACT inhaler Inhale 2 puffs into the lungs every 4 (four) hours as needed. For shortness of breath/wheezing  . amphetamine-dextroamphetamine (ADDERALL) 10 MG tablet   . beclomethasone (QVAR REDIHALER) 80 MCG/ACT inhaler Inhale 2 puffs into the lungs 2 (two) times daily.  07/05/2020 buPROPion (WELLBUTRIN XL) 300 MG 24 hr tablet Take 1 tablet by mouth daily.  . busPIRone  (BUSPAR) 15 MG tablet Take 30 mg by mouth 2 (two) times daily.  Marland Kitchen EPINEPHrine 0.3 mg/0.3 mL IJ SOAJ injection Inject 0.3 mLs (0.3 mg total) into the muscle once as needed for anaphylaxis.  Marland Kitchen FASENRA PEN 30 MG/ML SOAJ INJECT 1 PEN UNDER THE SKIN AT WEEK 0, 4 AND 8, THEN INJECT 1 PEN EVERY 8 WEEKS THEREAFTER.  Marland Kitchen Fluticasone-Umeclidin-Vilant (TRELEGY ELLIPTA) 200-62.5-25 MCG/INH AEPB 1 puff once daily  . gabapentin (NEURONTIN) 300 MG capsule Take 300 mg by mouth at bedtime.  . lamoTRIgine (LAMICTAL) 200 MG tablet Take 200 mg by mouth 2 (two) times daily.  12-03-1985 lithium carbonate (LITHOBID) 300 MG CR tablet Take 1,200 mg by mouth at bedtime.  . montelukast (SINGULAIR) 10 MG tablet TAKE 1 TABLET BY MOUTH EVERYDAY AT BEDTIME  . pantoprazole (PROTONIX) 40 MG tablet Take 1 tablet (40 mg total) by mouth daily.  . predniSONE (DELTASONE) 10 MG tablet Take 20 mg twice a day for 3 days, then take 20 mg on day 4, then take 10 mg on the 5th day, then stop.  . predniSONE (DELTASONE) 10 MG tablet Take 3 tabs (30mg ) twice daily for 3 days, then 2 tabs (20mg ) twice daily for 3 days, then 1 tab (10mg ) twice daily for 3 days, then STOP.  Marland Kitchen sildenafil (VIAGRA) 50 MG tablet TAKE 1 TABLET BY MOUTH AS NEEDED AN HOUR BEFORE SEX. DO NOT TAKE MORE THAN 1 TIME IN 24 HOURS. THIS IS NOT A DAILY  MEDICINE.  Marland Kitchen SPIRIVA RESPIMAT 1.25 MCG/ACT AERS INHALE 2 PUFFS BY MOUTH INTO THE LUNGS DAILY  . XIIDRA 5 % SOLN INSTILL 1 DROP INTO BOTH EYES TWICE A DAY 2 MINUTE LID CLOSURE AFTER INSTILLING  . zolpidem (AMBIEN) 10 MG tablet Take 5-10 mg by mouth at bedtime.   . fluticasone (FLONASE) 50 MCG/ACT nasal spray Place 2 sprays into both nostrils daily.  Marland Kitchen guaiFENesin-codeine (CHERATUSSIN AC) 100-10 MG/5ML syrup Take 5 mLs by mouth 3 (three) times daily as needed for cough.   No facility-administered encounter medications on file as of 07/03/2020.     Review of Systems  Constitutional: Negative for chills and fever.  HENT: Positive for  rhinorrhea. Negative for congestion, ear pain, postnasal drip, sinus pressure, sinus pain, sneezing and sore throat.   Respiratory: Positive for cough. Negative for chest tightness, shortness of breath and wheezing.   Cardiovascular: Negative for chest pain and leg swelling.  Gastrointestinal: Negative for abdominal pain, diarrhea, nausea and vomiting.  Genitourinary: Negative for dysuria and frequency.  Skin: Negative for rash.  Neurological: Negative for dizziness, weakness and headaches.     Vitals There were no vitals taken for this visit.  Objective:   Physical Exam  No PE seen on video visit.  NAD or resp distress.  Talking in full sentences, no coughing during the video visit.  Assessment and Plan   1. COVID-19 virus infection - guaiFENesin-codeine (CHERATUSSIN AC) 100-10 MG/5ML syrup; Take 5 mLs by mouth 3 (three) times daily as needed for cough.  Dispense: 100 mL; Refill: 0  2. Moderate persistent asthma with acute exacerbation   Cont with the rest of prednisone taper given by Dr. Dellis Anes. Advising to get a oximeter and check this to give reassurance about his breathing. If worsening sob or coughing, then call the office.  Use his inhalers as directed.  Use cheratussin as needed for severe coughing. Increase fluid intake.  Reviewed criteria for the plasma infusion.  Pt doesn't meet criteria at this time. May return to work on 07/11/20 if feeling better.   F/u prn. Call or rto if not improving.   Follow Up Instructions:    I discussed the assessment and treatment plan with the patient. The patient was provided an opportunity to ask questions and all were answered. The patient agreed with the plan and demonstrated an understanding of the instructions.   The patient was advised to call back or seek an in-person evaluation if the symptoms worsen or if the condition fails to improve as anticipated.  I provided 15 minutes of non-face-to-face time during this  encounter.

## 2020-07-03 NOTE — Telephone Encounter (Signed)
Patient called to update Dr Dellis Anes that he tested positive for Covid on Sunday 07/02/2020.

## 2020-07-03 NOTE — Telephone Encounter (Signed)
Noted. Can we call him and make sure that he has pulse oximeter at home? We can also make sure that he has the number to receive monoclonal antibodies, if he qualifies? Thankfully, he is vaccinated which should keep him out of the hospital.   Malachi Bonds, MD [facility]

## 2020-07-03 NOTE — Progress Notes (Signed)
I connected by phone with Erik Ellis on 07/03/2020 at 4:38 PM to discuss the potential use of a new treatment for mild to moderate COVID-19 viral infection in non-hospitalized patients.  This patient is a 54 y.o. male that meets the FDA criteria for Emergency Use Authorization of COVID monoclonal antibody casirivimab/imdevimab.  Has a (+) direct SARS-CoV-2 viral test result  Has mild or moderate COVID-19   Is NOT hospitalized due to COVID-19  Is within 10 days of symptom onset  Has at least one of the high risk factor(s) for progression to severe COVID-19 and/or hospitalization as defined in EUA.  Specific high risk criteria : BMI > 25 and Chronic Lung Disease   I have spoken and communicated the following to the patient or parent/caregiver regarding COVID monoclonal antibody treatment:  1. FDA has authorized the emergency use for the treatment of mild to moderate COVID-19 in adults and pediatric patients with positive results of direct SARS-CoV-2 viral testing who are 31 years of age and older weighing at least 40 kg, and who are at high risk for progressing to severe COVID-19 and/or hospitalization.  2. The significant known and potential risks and benefits of COVID monoclonal antibody, and the extent to which such potential risks and benefits are unknown.  3. Information on available alternative treatments and the risks and benefits of those alternatives, including clinical trials.  4. Patients treated with COVID monoclonal antibody should continue to self-isolate and use infection control measures (e.g., wear mask, isolate, social distance, avoid sharing personal items, clean and disinfect "high touch" surfaces, and frequent handwashing) according to CDC guidelines.   5. The patient or parent/caregiver has the option to accept or refuse COVID monoclonal antibody treatment.  After reviewing this information with the patient, The patient agreed to proceed with receiving  casirivimab\imdevimab infusion and will be provided a copy of the Fact sheet prior to receiving the infusion.  Sx onset 8/24. Set up for infusion on 9/1 @ 10:30am. Directions given to [facility]. Pt is aware that insurance will be charged an infusion fee. Pt is fully vaccinated.   Cline Crock 07/03/2020 4:38 PM

## 2020-07-03 NOTE — Telephone Encounter (Signed)
Mr. emari, demmer are scheduled for a virtual visit with your provider today.    Just as we do with appointments in the office, we must obtain your consent to participate.  Your consent will be active for this visit and any virtual visit you may have with one of our providers in the next 365 days.    If you have a MyChart account, I can also send a copy of this consent to you electronically.  All virtual visits are billed to your insurance company just like a traditional visit in the office.  As this is a virtual visit, video technology does not allow for your provider to perform a traditional examination.  This may limit your provider's ability to fully assess your condition.  If your provider identifies any concerns that need to be evaluated in person or the need to arrange testing such as labs, EKG, etc, we will make arrangements to do so.    Although advances in technology are sophisticated, we cannot ensure that it will always work on either your end or our end.  If the connection with a video visit is poor, we may have to switch to a telephone visit.  With either a video or telephone visit, we are not always able to ensure that we have a secure connection.   I need to obtain your verbal consent now.   Are you willing to proceed with your visit today?   Jeanette Moffatt has provided verbal consent on 07/03/2020 for a virtual visit (video or telephone).   Marlowe Shores, LPN 7/91/5056  97:94 AM

## 2020-07-05 ENCOUNTER — Ambulatory Visit (HOSPITAL_COMMUNITY)
Admission: RE | Admit: 2020-07-05 | Discharge: 2020-07-05 | Disposition: A | Discharge status: Home / Self Care | Payer: BC Managed Care – PPO | Source: Ambulatory Visit | Attending: Pulmonary Disease | Admitting: Pulmonary Disease

## 2020-07-05 DIAGNOSIS — U071 COVID-19: Secondary | ICD-10-CM | POA: Insufficient documentation

## 2020-07-05 MED ORDER — DIPHENHYDRAMINE HCL 50 MG/ML IJ SOLN: 50.0000 mg | Freq: Once | INTRAMUSCULAR | Status: DC | PRN

## 2020-07-05 MED ORDER — SODIUM CHLORIDE 0.9 % IV SOLN: INTRAVENOUS | Status: DC | PRN

## 2020-07-05 MED ORDER — SODIUM CHLORIDE 0.9 % IV SOLN
1200.0000 mg | Freq: Once | INTRAVENOUS | Status: AC
  Administered 2020-07-05: 1200 mg via INTRAVENOUS

## 2020-07-05 MED ORDER — ALBUTEROL SULFATE HFA 108 (90 BASE) MCG/ACT IN AERS: 2.0000 | INHALATION_SPRAY | Freq: Once | RESPIRATORY_TRACT | Status: DC | PRN

## 2020-07-05 MED ORDER — EPINEPHRINE 0.3 MG/0.3ML IJ SOAJ: 0.3000 mg | Freq: Once | INTRAMUSCULAR | Status: DC | PRN

## 2020-07-05 MED ORDER — FAMOTIDINE IN NACL 20-0.9 MG/50ML-% IV SOLN: 20.0000 mg | Freq: Once | INTRAVENOUS | Status: DC | PRN

## 2020-07-05 MED ORDER — METHYLPREDNISOLONE SODIUM SUCC 125 MG IJ SOLR: 125.0000 mg | Freq: Once | INTRAMUSCULAR | Status: DC | PRN

## 2020-07-05 NOTE — Progress Notes (Signed)
  Diagnosis: COVID-19  Physician: Patrick Wright, MD  Procedure: Covid Infusion Clinic Med: casirivimab\imdevimab infusion - Provided patient with casirivimab\imdevimab fact sheet for patients, parents and caregivers prior to infusion.  Complications: No immediate complications noted.  Discharge: Discharged home   Rendy Lazard N [place] 07/05/2020  

## 2020-07-05 NOTE — Discharge Instructions (Signed)

## 2020-07-06 ENCOUNTER — Encounter: Payer: Self-pay | Admitting: Allergy & Immunology

## 2020-07-07 ENCOUNTER — Encounter: Payer: Self-pay | Admitting: Allergy & Immunology

## 2020-07-10 ENCOUNTER — Encounter: Payer: Self-pay | Admitting: Allergy & Immunology

## 2020-07-11 ENCOUNTER — Other Ambulatory Visit: Payer: Self-pay

## 2020-07-11 ENCOUNTER — Ambulatory Visit (INDEPENDENT_AMBULATORY_CARE_PROVIDER_SITE_OTHER): Payer: BC Managed Care – PPO | Admitting: Allergy & Immunology

## 2020-07-11 DIAGNOSIS — J4540 Moderate persistent asthma, uncomplicated: Secondary | ICD-10-CM | POA: Diagnosis not present

## 2020-07-11 DIAGNOSIS — J310 Chronic rhinitis: Secondary | ICD-10-CM

## 2020-07-11 DIAGNOSIS — R0602 Shortness of breath: Secondary | ICD-10-CM | POA: Diagnosis not present

## 2020-07-11 DIAGNOSIS — K219 Gastro-esophageal reflux disease without esophagitis: Secondary | ICD-10-CM | POA: Diagnosis not present

## 2020-07-11 MED ORDER — DEXAMETHASONE 4 MG PO TABS: 16.0000 mg | ORAL_TABLET | Freq: Once | ORAL | 1 refills | Status: AC

## 2020-07-11 NOTE — Telephone Encounter (Signed)
Called and discussed with pt. Pt would like letter sent through [facility]

## 2020-07-11 NOTE — Progress Notes (Signed)
RE: Erik Ellis MRN: [id number] DOB: May 01, 1966 Date of Telemedicine Visit: 07/11/2020  Referring provider: Annalee Genta, DO Primary care provider: Annalee Genta, DO  Chief Complaint: Asthma   Telemedicine Follow Up Visit via Telephone: I connected with Erik Ellis for a follow up on 07/11/20 by telephone and verified that I am speaking with the correct person using two identifiers.   I discussed the limitations, risks, security and privacy concerns of performing an evaluation and management service by telephone and the availability of in person appointments. I also discussed with the patient that there may be a patient responsible charge related to this service. The patient expressed understanding and agreed to proceed.   Patient is at work.  Provider is at the office.  Visit start time: 11:05 AM Visit end time: 11:21 AM Insurance consent/check in by: [facility] consent and medical assistant/nurse: Ashleigh  History of Present Illness:  He is a 54 y.o. male, who is being followed for moderate persistent asthma as well as non-allergic rhinitis. His previous allergy office visit was in August 2021 with myself. At that time, his lung testing looked excellent.  We gave him a steroid injection because of symptoms consistent with an asthma exacerbation.  We also started him on a prednisone pack.  We stopped his Qvar and Spiriva and started Trelegy 1 puff once daily.  There is concerned that his Harrington Challenger was not working because had given him prednisone so frequently.  However, a few days after this visit, he was diagnosed with COVID-19.  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  For his rhinitis, we continue with Flonase.  Since being diagnosed with COVID-19 on August 29, we help him get monoclonal antibodies.  He also completed the prednisone that we had given him at her last office visit and received another course of  prednisone from his PCP.  He did not feel like the prednisone did much of anything.  He is not having a fever.  He endorses continued shortness of breath.  He denies any chest pain.  Overall, his symptoms are worse starting around 3 PM.  The rest of the day is fairly mild.  He is up-to-date on his Fasenra injections.  He also remains on Protonix.  He is fully vaccinated with [employer].  Review of his prednisone use shows 2 courses in 2017, 2 courses in 2018, 4 courses in 2019, 4 courses in 2020, and 5 courses thus far in 2021.  However, it should be noted that 2 of the 5 courses in 2021 were prescribed by myself right before he was diagnosed with COVID-19, which I am not sure Harrington Challenger would have affected anyway.  His first dose of Harrington Challenger was in December 2020.  Otherwise, there have been no changes to his past medical history, surgical history, family history, or social history.  Assessment and Plan:  Erik Ellis is a 54 y.o. male with:   Moderate persistent asthma without complication- alpha-1 antitrpysin and Aspergillus precipitinsall normal  Non-allergic rhinitis  COVID19 - s/p monoclonal antibody infusion   [place] continues to have SOB after his COVID19 infection. Thankfully, he was able to avoid hospitalization likely due to his vaccination, but he continues to have persistent SOB despite two rounds of prednisone. We are going to try using Decadron x 2 today to see if this is any more efficacious (he says that the prednisone did not help him at all). I did  initially offer a CXR, but he declined. However, he contacted me later to request it anyway. We are continuing with all of his inhalers and other asthma medications as well. I do not see a need for an antibiotic at this time.     Diagnostics: None.  Medication List:  Current Outpatient Medications  Medication Sig Dispense Refill  . albuterol (VENTOLIN HFA) 108 (90 Base) MCG/ACT inhaler Inhale 2 puffs into the lungs every 4 (four)  hours as needed. For shortness of breath/wheezing 18 g 2  . amphetamine-dextroamphetamine (ADDERALL) 10 MG tablet     . buPROPion (WELLBUTRIN XL) 300 MG 24 hr tablet Take 1 tablet by mouth daily.    . busPIRone (BUSPAR) 15 MG tablet Take 30 mg by mouth 2 (two) times daily.    Marland Kitchen EPINEPHrine 0.3 mg/0.3 mL IJ SOAJ injection Inject 0.3 mLs (0.3 mg total) into the muscle once as needed for anaphylaxis. 2 each 1  . FASENRA PEN 30 MG/ML SOAJ INJECT 1 PEN UNDER THE SKIN AT WEEK 0, 4 AND 8, THEN INJECT 1 PEN EVERY 8 WEEKS THEREAFTER. 1 pen 8  . Fluticasone-Umeclidin-Vilant (TRELEGY ELLIPTA) 200-62.5-25 MCG/INH AEPB 1 puff once daily 1 each 5  . gabapentin (NEURONTIN) 300 MG capsule Take 300 mg by mouth at bedtime.  4  . guaiFENesin-codeine (CHERATUSSIN AC) 100-10 MG/5ML syrup Take 5 mLs by mouth 3 (three) times daily as needed for cough. 100 mL 0  . lamoTRIgine (LAMICTAL) 200 MG tablet Take 200 mg by mouth 2 (two) times daily.    Marland Kitchen lithium carbonate (LITHOBID) 300 MG CR tablet Take 1,200 mg by mouth at bedtime.    . montelukast (SINGULAIR) 10 MG tablet TAKE 1 TABLET BY MOUTH EVERYDAY AT BEDTIME 90 tablet 0  . pantoprazole (PROTONIX) 40 MG tablet Take 1 tablet (40 mg total) by mouth daily. 90 tablet 3  . sildenafil (VIAGRA) 50 MG tablet TAKE 1 TABLET BY MOUTH AS NEEDED AN HOUR BEFORE SEX. DO NOT TAKE MORE THAN 1 TIME IN 24 HOURS. THIS IS NOT A DAILY MEDICINE.  1  . XIIDRA 5 % SOLN INSTILL 1 DROP INTO BOTH EYES TWICE A DAY 2 MINUTE LID CLOSURE AFTER INSTILLING  0  . zolpidem (AMBIEN) 10 MG tablet Take 5-10 mg by mouth at bedtime.     . beclomethasone (QVAR REDIHALER) 80 MCG/ACT inhaler Inhale 2 puffs into the lungs 2 (two) times daily. (Patient not taking: Reported on 07/11/2020) 10.6 g 4  . fluticasone (FLONASE) 50 MCG/ACT nasal spray Place 2 sprays into both nostrils daily. 48 mL 6  . predniSONE (DELTASONE) 10 MG tablet Take 20 mg twice a day for 3 days, then take 20 mg on day 4, then take 10 mg on the 5th  day, then stop. (Patient not taking: Reported on 07/11/2020) 15 tablet 0  . predniSONE (DELTASONE) 10 MG tablet Take 3 tabs (30mg ) twice daily for 3 days, then 2 tabs (20mg ) twice daily for 3 days, then 1 tab (10mg ) twice daily for 3 days, then STOP. (Patient not taking: Reported on 07/11/2020) 36 tablet 0  . SPIRIVA RESPIMAT 1.25 MCG/ACT AERS INHALE 2 PUFFS BY MOUTH INTO THE LUNGS DAILY (Patient not taking: Reported on 07/11/2020) 4 g 5   No current facility-administered medications for this visit.   Allergies: No Known Allergies I reviewed his past medical history, social history, family history, and environmental history and no significant changes have been reported from previous visits.  Review of Systems  Constitutional: Negative for  chills, diaphoresis, fatigue and fever.  HENT: Negative for congestion, ear discharge, ear pain, facial swelling, postnasal drip, rhinorrhea, sinus pressure, sinus pain and sore throat.   Eyes: Negative for pain, discharge and itching.  Respiratory: Positive for cough and shortness of breath. Negative for apnea and chest tightness.   Cardiovascular: Negative for chest pain.  Gastrointestinal: Negative for diarrhea and nausea.  Musculoskeletal: Negative for arthralgias and myalgias.  Skin: Negative for rash.  Allergic/Immunologic: Negative for environmental allergies and food allergies.    Objective:  Physical exam not obtained as encounter was done via telephone.   Previous notes and tests were reviewed.  I discussed the assessment and treatment plan with the patient. The patient was provided an opportunity to ask questions and all were answered. The patient agreed with the plan and demonstrated an understanding of the instructions.   The patient was advised to call back or seek an in-person evaluation if the symptoms worsen or if the condition fails to improve as anticipated.  I provided 16 minutes of non-face-to-face time during this encounter.  It was  my pleasure to participate in Erik Ellis care today. Please feel free to contact me with any questions or concerns.   Sincerely,  Alfonse Spruce, MD

## 2020-07-11 NOTE — Telephone Encounter (Signed)
Because of the prolonged nature of this I would recommend staying out of school/work the rest of this week may return to work next Monday, September 13  Please forward a letter to him and also make sure that we call him with this  If the patient gets worse later this week and feel he needs to be seen to connect with [place]

## 2020-07-12 ENCOUNTER — Encounter: Payer: Self-pay | Admitting: Allergy & Immunology

## 2020-07-13 ENCOUNTER — Encounter: Payer: Self-pay | Admitting: Family Medicine

## 2020-07-13 ENCOUNTER — Ambulatory Visit: Payer: Self-pay | Admitting: Allergy & Immunology

## 2020-07-13 ENCOUNTER — Encounter: Payer: Self-pay | Admitting: Allergy & Immunology

## 2020-07-13 ENCOUNTER — Other Ambulatory Visit: Payer: Self-pay | Admitting: Allergy & Immunology

## 2020-07-13 ENCOUNTER — Ambulatory Visit
Admission: RE | Admit: 2020-07-13 | Discharge: 2020-07-13 | Disposition: A | Discharge status: Home / Self Care | Payer: BC Managed Care – PPO | Source: Ambulatory Visit | Attending: Allergy & Immunology | Admitting: Allergy & Immunology

## 2020-07-13 DIAGNOSIS — R0602 Shortness of breath: Secondary | ICD-10-CM

## 2020-07-13 NOTE — Telephone Encounter (Signed)
Nurses   Please set up Erik Needle for an appointment tomorrow at 320 with me (Dr. Ladona Ridgel is out this week) Unfortunately currently right now my schedule is booked up before that time but hopefully the patient can come at that time to be checked out thank you

## 2020-07-13 NOTE — Telephone Encounter (Signed)
Appointment made and patient notified

## 2020-07-13 NOTE — Telephone Encounter (Signed)
I saw his request for a chest x-ray when I was finishing his chart.  I did order 1.  Can someone call to tell him how to go about getting 1?  I ordered it for [facility] imaging on [address].  Malachi Bonds, MD [facility]

## 2020-07-14 ENCOUNTER — Ambulatory Visit (INDEPENDENT_AMBULATORY_CARE_PROVIDER_SITE_OTHER): Payer: BC Managed Care – PPO | Admitting: Family Medicine

## 2020-07-14 ENCOUNTER — Other Ambulatory Visit: Payer: Self-pay | Admitting: Allergy & Immunology

## 2020-07-14 ENCOUNTER — Other Ambulatory Visit: Payer: Self-pay

## 2020-07-14 DIAGNOSIS — U071 COVID-19: Secondary | ICD-10-CM | POA: Diagnosis not present

## 2020-07-14 MED ORDER — NYSTATIN 100000 UNIT/ML MT SUSP: 5.0000 mL | Freq: Four times a day (QID) | OROMUCOSAL | 0 refills | Status: DC

## 2020-07-14 MED ORDER — PREDNISONE 20 MG PO TABS: ORAL_TABLET | ORAL | 0 refills | Status: DC

## 2020-07-14 MED ORDER — FLUCONAZOLE 200 MG PO TABS: 200.0000 mg | ORAL_TABLET | Freq: Every day | ORAL | 0 refills | Status: DC

## 2020-07-14 NOTE — Progress Notes (Addendum)
   Subjective:    Patient ID: Erik Ellis, male    DOB: Aug 30, 1966, 54 y.o.   MRN: [id number]  HPI  Patient arrives with ongoing cough and not feeling well since testing positive for Covid last week. Patient states he has had the antibody infusion. Patient with significant coughing congestion has asthma not feeling well had Covid just does not feel good.  Low energy.  At times feels like he cannot breathe is good as he should He denies any high fever chills sweats nausea vomiting diarrhea low energy though. 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  He was cautioned not to return to full activity too quickly follow-up if progressive troubles  I did discuss in detail with the patient warning signs to watch for and when to go to the ER  He also has thrush we will treat that aggressively with Diflucan as well as nystatin oral solution

## 2020-07-17 ENCOUNTER — Telehealth: Payer: Self-pay | Admitting: Family Medicine

## 2020-07-17 NOTE — Telephone Encounter (Signed)
[facility] requesting refill on Pantoprazole 40 mg tablet. Take one tablet po every day. Pt last seen 07/14/20 for COVID. Please advise. Thank you

## 2020-07-18 ENCOUNTER — Telehealth: Payer: Self-pay | Admitting: Family Medicine

## 2020-07-18 MED ORDER — PANTOPRAZOLE SODIUM 40 MG PO TBEC
40.0000 mg | DELAYED_RELEASE_TABLET | Freq: Every day | ORAL | 1 refills | Status: DC
Start: 2020-07-18 — End: 2021-01-12

## 2020-07-18 NOTE — Telephone Encounter (Signed)
Pt requesting refill on pantoprazole.  Sent. Dr. Ladona Ridgel

## 2020-07-18 NOTE — Telephone Encounter (Signed)
Pt needs to make sure to take food with prednisone that was given.  Could give ulcer to stomach if not.  Will send refill thx. Dr. Ladona Ridgel

## 2020-07-18 NOTE — Telephone Encounter (Signed)
Pt sent mychart message

## 2020-07-19 ENCOUNTER — Encounter: Payer: Self-pay | Admitting: Family Medicine

## 2020-07-20 ENCOUNTER — Encounter: Payer: Self-pay | Admitting: Family Medicine

## 2020-07-20 NOTE — Telephone Encounter (Signed)
Pt states his breathing is good but Feels so weak that his legs dont feel like he can walk normal. No pain just weakness. No fever. Pt wants to extend work excuse and know if he can do anything to feel better quicker. Eating good and drinking fluids. Pt wanted to send message to Dr. Lorin Picket since he was the one that saw him.

## 2020-07-20 NOTE — Telephone Encounter (Signed)
Left message to return call to get more information 

## 2020-07-20 NOTE — Telephone Encounter (Signed)
Definitely go ahead and extend her work note through next Tuesday.  This will give him a several more days to rest  Unfortunately there is no additional medicines that will make this better if he starts having increased chest congestion with discolored drainage the patient should let us know  Otherwise gradual increase of activity along with rest is the best approach

## 2020-07-20 NOTE — Telephone Encounter (Signed)
Please give work excuse. See below.

## 2020-07-21 ENCOUNTER — Encounter: Payer: Self-pay | Admitting: Family Medicine

## 2020-07-24 ENCOUNTER — Encounter: Payer: Self-pay | Admitting: Family Medicine

## 2020-07-25 ENCOUNTER — Other Ambulatory Visit: Payer: Self-pay

## 2020-07-25 MED ORDER — ALBUTEROL SULFATE (2.5 MG/3ML) 0.083% IN NEBU: 2.5000 mg | INHALATION_SOLUTION | RESPIRATORY_TRACT | 2 refills | Status: DC | PRN

## 2020-07-26 ENCOUNTER — Encounter: Payer: Self-pay | Admitting: *Deleted

## 2020-07-26 ENCOUNTER — Telehealth: Payer: Self-pay | Admitting: *Deleted

## 2020-07-26 NOTE — Telephone Encounter (Signed)
Dr. Gery Pray williams @ [facility] called wanting to know if we could order a Lithium level on this pt. States it would be more difficult for them to order and get it to the pt. Please advise.

## 2020-07-26 NOTE — Telephone Encounter (Signed)
Not wanting to take over his levels.  Pt has been seeing psychiatry for years and not wanting to get involved in abnormal levels etc.  They can order the labs and put in mail for pt to get at [facility]. Thx. Dr. Ladona Ridgel

## 2020-07-26 NOTE — Telephone Encounter (Signed)
Message sent on mychart 

## 2020-07-27 ENCOUNTER — Encounter: Payer: Self-pay | Admitting: Family Medicine

## 2020-07-28 ENCOUNTER — Other Ambulatory Visit: Payer: Self-pay

## 2020-07-28 ENCOUNTER — Ambulatory Visit (INDEPENDENT_AMBULATORY_CARE_PROVIDER_SITE_OTHER): Payer: BC Managed Care – PPO | Admitting: Nurse Practitioner

## 2020-07-28 VITALS — BP 122/82 | Temp 97.0°F | Wt 170.4 lb

## 2020-07-28 DIAGNOSIS — R0789 Other chest pain: Secondary | ICD-10-CM | POA: Diagnosis not present

## 2020-07-28 DIAGNOSIS — R5383 Other fatigue: Secondary | ICD-10-CM

## 2020-07-28 DIAGNOSIS — T732XXS Exhaustion due to exposure, sequela: Secondary | ICD-10-CM

## 2020-07-28 NOTE — Patient Instructions (Signed)
Try biofreeze, lidocaine patches, or voltaren gel on the affected area.  800 mg Ibuprofen three times a day is okay. Take with food to prevent upset stomach.  Take it easy, don't overexert yourself.

## 2020-07-28 NOTE — Progress Notes (Signed)
   Subjective:    Patient ID: Erik Ellis, male    DOB: 1966/06/13, 54 y.o.   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 Patient had + COVID test on 8/27, believes he is still experiencing symptoms from this. 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   Review of Systems  Constitutional: Positive for fatigue. Negative for appetite change, chills, diaphoresis and fever.       Has had ongoing fatigue for a month since having  COVID.   Respiratory: Negative for cough, chest tightness, shortness of breath and wheezing.   Cardiovascular: Positive for chest pain. Negative for palpitations and leg swelling.       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  Musculoskeletal:       Localized pain left lower anterior chest wall. Has improved since Friday.  Pain is intermittent, worse after activity or with certain movements, desrcibed as a brief sharp and throbbing pain. Denies radiation, pain with deep inspiration, nausea, vomiting.  Has gradually increasing fatigue during the day. Starts out fine in the morning. Teaches gym classes at school.      Objective:   Physical Exam Constitutional:      Appearance: He is well-developed and well-groomed.  Cardiovascular:     Rate and Rhythm: Normal rate and regular rhythm.     Heart sounds: Normal heart sounds. No murmur heard.      Comments: Regular rate and rhythm, without murmur or bruit. No edema in the extremities. Pulmonary:     Effort: Pulmonary effort is normal.     Comments: Breath sounds clear and equal bilaterally. Without wheezing or rhonchi. Chest:     Chest wall: No mass, lacerations, deformity, swelling, tenderness, crepitus or edema.  Musculoskeletal:        General: No swelling, tenderness, deformity or signs of injury. Normal range of motion.     Comments: Minimal  tenderness to deep palpation of the left lower anterior chest wall. Skin clear. No discomfort with deep inspiration or movement.     Today's Vitals   07/28/20 0848  BP: 122/82  Temp: (!) 97 F (36.1 C)  TempSrc: Oral  SpO2: 98%  Weight: 77.3 kg   Body mass index is 26.29 kg/m.     Assessment & Plan:   Problem List Items Addressed This Visit    None    Visit Diagnoses    Chest wall pain    -  Primary   Fatigue due to exposure, sequela          Encouraged patient to try biofreeze, voltaren gel, and lidocaine patches to the affected area. Ibuprofen 800mg  TID is okay. Take with food to prevent upset stomach.  Return to work Monday, encouraged him to take it easy throughout the day and advance to coaching in the evenings as his fatigue improves.  Return if symptoms worsen or fail to improve.  30 minutes was spent with the patient including previsit chart review, time spent with patient, discussion of health issues, review of data including medical record, and documentation of the visit.

## 2020-07-30 ENCOUNTER — Encounter: Payer: Self-pay | Admitting: Nurse Practitioner

## 2020-07-30 NOTE — Progress Notes (Signed)
   Subjective:    Patient ID: Erik Ellis, male    DOB: 1966-08-31, 54 y.o.   MRN: [id number]  HPI    Review of Systems     Objective:   Physical Exam        Assessment & Plan:

## 2020-08-02 ENCOUNTER — Encounter: Payer: Self-pay | Admitting: Family Medicine

## 2020-08-04 ENCOUNTER — Other Ambulatory Visit: Payer: Self-pay

## 2020-08-04 ENCOUNTER — Encounter: Payer: Self-pay | Admitting: Family Medicine

## 2020-08-04 ENCOUNTER — Telehealth: Payer: Self-pay | Admitting: Family Medicine

## 2020-08-04 ENCOUNTER — Ambulatory Visit (INDEPENDENT_AMBULATORY_CARE_PROVIDER_SITE_OTHER): Payer: BC Managed Care – PPO | Admitting: Family Medicine

## 2020-08-04 VITALS — BP 130/78 | HR 102 | Temp 97.5°F | Ht 67.5 in | Wt 169.0 lb

## 2020-08-04 DIAGNOSIS — R5382 Chronic fatigue, unspecified: Secondary | ICD-10-CM

## 2020-08-04 DIAGNOSIS — R45851 Suicidal ideations: Secondary | ICD-10-CM | POA: Diagnosis not present

## 2020-08-04 DIAGNOSIS — U099 Post covid-19 condition, unspecified: Secondary | ICD-10-CM

## 2020-08-04 DIAGNOSIS — R519 Headache, unspecified: Secondary | ICD-10-CM

## 2020-08-04 MED ORDER — DICLOFENAC SODIUM 75 MG PO TBEC: 75.0000 mg | DELAYED_RELEASE_TABLET | Freq: Two times a day (BID) | ORAL | 0 refills | Status: DC

## 2020-08-04 NOTE — Telephone Encounter (Signed)
Patient given number to Covid clinic and stated he will call and see if he can get an appt. Patient also stated he was able to speak with his psychiatrist and he advised him to increase his Lithium by one pill for now and and also sent in Lexapro but was told to hold that until they speak again on Monday and to call on call psychiatrist or go to ER this weekend if any problems.

## 2020-08-04 NOTE — Telephone Encounter (Signed)
Ok great! Thanks, Dr. Ladona Ridgel

## 2020-08-04 NOTE — Progress Notes (Signed)
Patient ID: Erik Ellis, male    DOB: 08-28-1966, 54 y.o.   MRN: [id number]   Chief Complaint  Patient presents with  . Fatigue   Subjective:    HPI  CC- fatigue. H/o covid 19 dx 07/13/20.  Had 2 rounds of steroids, inhalers, cough medications and abx.  Pt was given monoclonal ab infusion. Has improved from the breathing stand point.  But now having inc fatigue and low energy. Having fatigue and headaches for about 4 weeks. Takes tylenol and advil. Started after having covid.   Pt stating "felling bad" and "lousy" feeling "weakness" since returning to work this past week. Getting "worse and worse." Pt went back to work 5 days ago.  But only going for 2 hrs then leaving early for first 2 days.  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 Pt was given extra week off after his 10 day quarantine due to "fatigue." Couldn't make it throught school day per pt this week that he was supposed to return to work. 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   Went to practice for 1 hr, earlier this week a few days.  Unable to stay at work all day though.  Not doing much sitting at practice and school.  Feeling worse after napping and had a bad headache yesterday. Today having a mild headache.  No light sensitivity, n/v.  Pain is in back of head/neck.  No h/o migraines. Headache on Tuesday- taking 800mg  ibuprofen. And tried tylenol, not helping. Had "bad headaches" in past.  Medical History Erik Ellis has a past medical history of Asthma.   Outpatient Encounter Medications as of 08/04/2020  Medication Sig  . albuterol (PROVENTIL) (2.5 MG/3ML) 0.083% nebulizer solution Take 3 mLs (2.5 mg total) by nebulization every 4 (four) hours as needed for wheezing or shortness of breath.  10/04/2020 albuterol (VENTOLIN HFA) 108 (90 Base) MCG/ACT inhaler Inhale 2 puffs into the lungs every 4 (four) hours as needed. For shortness of breath/wheezing  .  amphetamine-dextroamphetamine (ADDERALL) 10 MG tablet   . buPROPion (WELLBUTRIN XL) 300 MG 24 hr tablet Take 1 tablet by mouth daily.  . busPIRone (BUSPAR) 15 MG tablet Take 30 mg by mouth 2 (two) times daily.  Marland Kitchen FASENRA PEN 30 MG/ML SOAJ INJECT 1 PEN UNDER THE SKIN AT WEEK 0, 4 AND 8, THEN INJECT 1 PEN EVERY 8 WEEKS THEREAFTER.  . fluticasone (FLONASE) 50 MCG/ACT nasal spray SPRAY 2 SPRAYS INTO EACH NOSTRIL EVERY DAY  . Fluticasone-Umeclidin-Vilant (TRELEGY ELLIPTA) 200-62.5-25 MCG/INH AEPB 1 puff once daily  . gabapentin (NEURONTIN) 300 MG capsule Take 300 mg by mouth at bedtime.  . lamoTRIgine (LAMICTAL) 200 MG tablet Take 200 mg by mouth 2 (two) times daily.  12-03-1985 lithium carbonate (LITHOBID) 300 MG CR tablet Take 1,200 mg by mouth at bedtime.  . montelukast (SINGULAIR) 10 MG tablet TAKE 1 TABLET BY MOUTH EVERYDAY AT BEDTIME  . pantoprazole (PROTONIX) 40 MG tablet Take 1 tablet (40 mg total) by mouth daily.  Marland Kitchen XIIDRA 5 % SOLN INSTILL 1 DROP INTO BOTH EYES TWICE A DAY 2 MINUTE LID CLOSURE AFTER INSTILLING  . zolpidem (AMBIEN) 10 MG tablet Take 5-10 mg by mouth at bedtime.   . diclofenac (VOLTAREN) 75 MG EC tablet Take 1 tablet (75 mg total) by mouth 2 (two) times daily.  Marland Kitchen EPINEPHrine 0.3 mg/0.3 mL IJ SOAJ injection Inject 0.3 mLs (0.3 mg total) into the muscle once as needed  for anaphylaxis. (Patient not taking: Reported on 08/04/2020)  . sildenafil (VIAGRA) 50 MG tablet TAKE 1 TABLET BY MOUTH AS NEEDED AN HOUR BEFORE SEX. DO NOT TAKE MORE THAN 1 TIME IN 24 HOURS. THIS IS NOT A DAILY MEDICINE.  . [DISCONTINUED] fluconazole (DIFLUCAN) 200 MG tablet Take 1 tablet (200 mg total) by mouth daily.  . [DISCONTINUED] guaiFENesin-codeine (CHERATUSSIN AC) 100-10 MG/5ML syrup Take 5 mLs by mouth 3 (three) times daily as needed for cough.  . [DISCONTINUED] nystatin (MYCOSTATIN) 100000 UNIT/ML suspension Take 5 mLs (500,000 Units total) by mouth 4 (four) times daily. Swish and spit  . [DISCONTINUED] predniSONE  (DELTASONE) 20 MG tablet 3 a day for 2 days, 2 a day for 2 days and one a day for 2 days (Patient not taking: Reported on 07/28/2020)   No facility-administered encounter medications on file as of 08/04/2020.     Review of Systems  Constitutional: Positive for fatigue. Negative for chills and fever.  HENT: Negative for congestion, rhinorrhea and sore throat.   Respiratory: Negative for cough, shortness of breath and wheezing.   Cardiovascular: Negative for chest pain and leg swelling.  Gastrointestinal: Negative for abdominal pain, diarrhea, nausea and vomiting.  Genitourinary: Negative for dysuria and frequency.  Skin: Negative for rash.  Neurological: Positive for headaches. Negative for dizziness, weakness, light-headedness and numbness.     Vitals BP 130/78   Pulse (!) 102   Temp (!) 97.5 F (36.4 C)   Ht 5' 7.5" (1.715 m)   Wt 169 lb (76.7 kg)   SpO2 100%   BMI 26.08 kg/m   Objective:   Physical Exam Vitals and nursing note reviewed.  Constitutional:      General: He is not in acute distress.    Appearance: Normal appearance. He is not ill-appearing.  HENT:     Head: Normocephalic and atraumatic.  Eyes:     Extraocular Movements: Extraocular movements intact.     Conjunctiva/sclera: Conjunctivae normal.     Pupils: Pupils are equal, round, and reactive to light.  Cardiovascular:     Rate and Rhythm: Normal rate and regular rhythm.  Pulmonary:     Effort: Pulmonary effort is normal. No respiratory distress.     Breath sounds: Normal breath sounds.  Musculoskeletal:        General: Normal range of motion.     Cervical back: Normal range of motion.  Skin:    General: Skin is warm and dry.     Findings: No rash.  Neurological:     General: No focal deficit present.     Mental Status: He is alert and oriented to person, place, and time.     Cranial Nerves: No cranial nerve deficit.     Motor: No weakness.     Gait: Gait normal.  Psychiatric:        Behavior:  Behavior normal.        Thought Content: Thought content normal.        Judgment: Judgment normal.     Comments: +anxious mood/affect      Assessment and Plan   1. Nonintractable episodic headache, unspecified headache type  2. COVID-19 long hauler - Ambulatory referral to Infectious Disease  3. COVID-19 long hauler manifesting chronic fatigue - Ambulatory referral to Infectious Disease  4. Suicidal ideation    Referral to ID- long hauler clinic for progressive fatigue. Headaches- gave bid diclofenac to try for headaches and increase fluids. Advising pt to return to work and try to  get through a whole day and to avoid going to football practices if not able to get through a whole day. Recommending sitting more and taking more breaks from standing at work during the day.  Pt brought up concerns of SI at end of visit.  Stating he will call his psychiatrist today. I advised him he could go to ER for evaluation, pt declined.  Pt stating just feeling down and depressed about not being back at his normal energy level.  Pt advised to call psychiatrist and if can't get through to call us back.   F/u prn.

## 2020-08-07 ENCOUNTER — Encounter: Payer: Self-pay | Admitting: Family Medicine

## 2020-08-15 ENCOUNTER — Ambulatory Visit (INDEPENDENT_AMBULATORY_CARE_PROVIDER_SITE_OTHER): Payer: BC Managed Care – PPO | Admitting: Nurse Practitioner

## 2020-08-15 VITALS — BP 144/98 | HR 76 | Temp 97.3°F | Ht 67.5 in | Wt 175.0 lb

## 2020-08-15 DIAGNOSIS — R5383 Other fatigue: Secondary | ICD-10-CM | POA: Diagnosis not present

## 2020-08-15 DIAGNOSIS — R5381 Other malaise: Secondary | ICD-10-CM | POA: Diagnosis not present

## 2020-08-15 DIAGNOSIS — R519 Headache, unspecified: Secondary | ICD-10-CM | POA: Diagnosis not present

## 2020-08-15 DIAGNOSIS — Z8616 Personal history of COVID-19: Secondary | ICD-10-CM | POA: Diagnosis not present

## 2020-08-15 NOTE — Progress Notes (Signed)
@Patient  ID: Erik Ellis, male    DOB: 1966-10-20, 54 y.o.   MRN: [id number]  Chief Complaint  Patient presents with  . New Patient (Initial Visit)    COVID Pos: 8/29 Has been seen by PCP, stilll having alot of fatigue and weakness especially in legs. Feels he is dragging legs while walking at times.     Referring provider: 9/29, DO   54 year old male with history of asthma, GERD, bipolar. Diagnosed with Covid 07/02/20.   HPI  Patient presents for post covid care visit. H/o covid 19 dx 07/02/20.  Had 2 rounds of steroids, inhalers, cough medications and abx.  Pt was given monoclonal ab infusion. He has started back to work. Checking O2 sats at home. Sats ranging in he upper 90's on RA.  Patient complains today of headaches and fatigue.  He states that his headaches have improved since his last visit with his primary care.  He states that the fatigue is about the same.  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  He is having a hard time since he is returned to work making it through the day without getting extremely fatigued.  He states that his legs feel very weak especially in his upper thigh muscles bilaterally. Denies f/c/s, n/v/d, hemoptysis, PND, chest pain or edema.       No Known Allergies  Immunization History  Administered Date(s) Administered  . PFIZER SARS-COV-2 Vaccination 01/09/2020, 01/30/2020    Past Medical History:  Diagnosis Date  . Asthma     Tobacco History: Social History   Tobacco Use  Smoking Status Never Smoker  Smokeless Tobacco Never Used   Counseling given: Not Answered   Outpatient Encounter Medications as of 08/15/2020  Medication Sig  . albuterol (VENTOLIN HFA) 108 (90 Base) MCG/ACT inhaler Inhale 2 puffs into the lungs every 4 (four) hours as needed. For shortness of breath/wheezing  . amphetamine-dextroamphetamine (ADDERALL) 10 MG tablet   . buPROPion (WELLBUTRIN XL) 300 MG 24 hr  tablet Take 1 tablet by mouth daily.  . busPIRone (BUSPAR) 15 MG tablet Take 30 mg by mouth 2 (two) times daily.  . diclofenac (VOLTAREN) 75 MG EC tablet Take 1 tablet (75 mg total) by mouth 2 (two) times daily.  10/15/2020 FASENRA PEN 30 MG/ML SOAJ INJECT 1 PEN UNDER THE SKIN AT WEEK 0, 4 AND 8, THEN INJECT 1 PEN EVERY 8 WEEKS THEREAFTER.  . fluticasone (FLONASE) 50 MCG/ACT nasal spray SPRAY 2 SPRAYS INTO EACH NOSTRIL EVERY DAY  . Fluticasone-Umeclidin-Vilant (TRELEGY ELLIPTA) 200-62.5-25 MCG/INH AEPB 1 puff once daily  . gabapentin (NEURONTIN) 300 MG capsule Take 300 mg by mouth at bedtime.  . lamoTRIgine (LAMICTAL) 200 MG tablet Take 200 mg by mouth 2 (two) times daily.  12-03-1985 lithium carbonate (LITHOBID) 300 MG CR tablet Take 1,200 mg by mouth at bedtime.  . montelukast (SINGULAIR) 10 MG tablet TAKE 1 TABLET BY MOUTH EVERYDAY AT BEDTIME  . pantoprazole (PROTONIX) 40 MG tablet Take 1 tablet (40 mg total) by mouth daily.  Marland Kitchen XIIDRA 5 % SOLN INSTILL 1 DROP INTO BOTH EYES TWICE A DAY 2 MINUTE LID CLOSURE AFTER INSTILLING  . zolpidem (AMBIEN) 10 MG tablet Take 5-10 mg by mouth at bedtime.   Marland Kitchen albuterol (PROVENTIL) (2.5 MG/3ML) 0.083% nebulizer solution Take 3 mLs (2.5 mg total) by nebulization every 4 (four) hours as needed for wheezing or shortness of breath. (Patient not taking: Reported on 08/15/2020)  .  EPINEPHrine 0.3 mg/0.3 mL IJ SOAJ injection Inject 0.3 mLs (0.3 mg total) into the muscle once as needed for anaphylaxis. (Patient not taking: Reported on 08/04/2020)  . sildenafil (VIAGRA) 50 MG tablet TAKE 1 TABLET BY MOUTH AS NEEDED AN HOUR BEFORE SEX. DO NOT TAKE MORE THAN 1 TIME IN 24 HOURS. THIS IS NOT A DAILY MEDICINE.   No facility-administered encounter medications on file as of 08/15/2020.     Review of Systems  Review of Systems  Constitutional: Positive for fatigue. Negative for fever.  HENT: Negative.   Respiratory: Negative for cough and shortness of breath.   Cardiovascular: Negative.   Negative for chest pain, palpitations and leg swelling.  Gastrointestinal: Negative.   Allergic/Immunologic: Negative.   Neurological: Positive for weakness (lower extremities) and headaches.  Psychiatric/Behavioral: Negative.        Physical Exam  BP (!) 144/98 (BP Location: Left Arm)   Pulse 76   Temp (!) 97.3 F (36.3 C)   Ht 5' 7.5" (1.715 m)   Wt 175 lb (79.4 kg)   SpO2 98%   BMI 27.00 kg/m   Wt Readings from Last 5 Encounters:  08/15/20 175 lb (79.4 kg)  08/04/20 169 lb (76.7 kg)  07/28/20 170 lb 6.4 oz (77.3 kg)  05/12/20 174 lb 9.6 oz (79.2 kg)  02/18/20 172 lb 3.2 oz (78.1 kg)     Physical Exam Vitals and nursing note reviewed.  Constitutional:      General: He is not in acute distress.    Appearance: He is well-developed.  Cardiovascular:     Rate and Rhythm: Normal rate and regular rhythm.  Pulmonary:     Effort: Pulmonary effort is normal.     Breath sounds: Normal breath sounds.  Musculoskeletal:     Right lower leg: No edema.     Left lower leg: No edema.     Comments: Equal, but dimished strength noted in both lower extremities. Gait stable.   Skin:    General: Skin is warm and dry.  Neurological:     Mental Status: He is alert and oriented to person, place, and time.        Assessment & Plan:   History of COVID-19 Assessment: Slowly improving. Weakness noted to bilateral lower extremities. Patient is only 8 weeks out. We discussed that he does need time for recovery and this may take a few more weeks. Recommend evaluation by PT for strengtening and endurance. Will give hand out on some home strengthening exercises that he can try.  Discussed impotance of staying well hydrated and eating a high protein diet for muscle recovery.   Plan:  Stay well hydrated  Stay active  Deep breathing exercises  May start vitamin C 2,000 mg daily, vitamin D3 2,000 IU daily, Zinc 220 mg daily, and Quercetin 500 mg twice daily  May take tylenol or fever  or pain   Physical Deconditioning:  Will place referral to PT  Will give handout on home PT  Headaches:  Magnesium 600 mg daily Riboflavin 400 mg daily   Follow up:  Follow up in 2 months or sooner if needed      Ivonne Andrew, NP 08/15/2020

## 2020-08-15 NOTE — Patient Instructions (Addendum)
Covid 19:  Assessment: Slowly improving. Weakness noted to bilateral lower extremities. Patient is only 8 weeks out. We discussed that he does need time for recovery and this may take a few more weeks. Recommend evaluation by PT for strengtening and endurance. Will give hand out on some home strengthening exercises that he can try.  Discussed impotance of staying well hydrated and eating a high protein diet for muscle recovery.   Plan:  Stay well hydrated  Stay active  Deep breathing exercises  May start vitamin C 2,000 mg daily, vitamin D3 2,000 IU daily, Zinc 220 mg daily, and Quercetin 500 mg twice daily  May take tylenol or fever or pain   Physical Deconditioning:  Will place referral to PT  Will give handout on home PT  Headaches:  Magnesium 600 mg daily Riboflavin 400 mg daily   Follow up:  Follow up in 2 months or sooner if needed

## 2020-08-15 NOTE — Assessment & Plan Note (Signed)
Assessment: Slowly improving. Weakness noted to bilateral lower extremities. Patient is only 8 weeks out. We discussed that he does need time for recovery and this may take a few more weeks. Recommend evaluation by PT for strengtening and endurance. Will give hand out on some home strengthening exercises that he can try.  Discussed impotance of staying well hydrated and eating a high protein diet for muscle recovery.   Plan:  Stay well hydrated  Stay active  Deep breathing exercises  May start vitamin C 2,000 mg daily, vitamin D3 2,000 IU daily, Zinc 220 mg daily, and Quercetin 500 mg twice daily  May take tylenol or fever or pain   Physical Deconditioning:  Will place referral to PT  Will give handout on home PT  Headaches:  Magnesium 600 mg daily Riboflavin 400 mg daily   Follow up:  Follow up in 2 months or sooner if needed

## 2020-08-16 LAB — COMPREHENSIVE METABOLIC PANEL
ALT: 30 IU/L (ref 0–44)
AST: 19 IU/L (ref 0–40)
Albumin/Globulin Ratio: 1.9 (ref 1.2–2.2)
Albumin: 4.4 g/dL (ref 3.8–4.9)
Alkaline Phosphatase: 100 IU/L (ref 44–121)
BUN/Creatinine Ratio: 19 (ref 9–20)
BUN: 26 mg/dL — ABNORMAL HIGH (ref 6–24)
Bilirubin Total: 0.4 mg/dL (ref 0.0–1.2)
CO2: 28 mmol/L (ref 20–29)
Calcium: 10.2 mg/dL (ref 8.7–10.2)
Chloride: 103 mmol/L (ref 96–106)
Creatinine, Ser: 1.34 mg/dL — ABNORMAL HIGH (ref 0.76–1.27)
GFR calc Af Amer: 68 mL/min/{1.73_m2} (ref >59)
GFR calc non Af Amer: 59 mL/min/{1.73_m2} — ABNORMAL LOW (ref >59)
Globulin, Total: 2.3 g/dL (ref 1.5–4.5)
Glucose: 40 mg/dL — ABNORMAL LOW (ref 65–99)
Potassium: 4.5 mmol/L (ref 3.5–5.2)
Sodium: 142 mmol/L (ref 134–144)
Total Protein: 6.7 g/dL (ref 6.0–8.5)

## 2020-08-16 LAB — CBC
Hematocrit: 41 % (ref 37.5–51.0)
Hemoglobin: 14 g/dL (ref 13.0–17.7)
MCH: 32.1 pg (ref 26.6–33.0)
MCHC: 34.1 g/dL (ref 31.5–35.7)
MCV: 94 fL (ref 79–97)
Platelets: 313 10*3/uL (ref 150–450)
RBC: 4.36 x10E6/uL (ref 4.14–5.80)
RDW: 12.6 % (ref 11.6–15.4)
WBC: 8.7 10*3/uL (ref 3.4–10.8)

## 2020-08-16 LAB — VITAMIN D 25 HYDROXY (VIT D DEFICIENCY, FRACTURES): Vit D, 25-Hydroxy: 42.8 ng/mL (ref 30.0–100.0)

## 2020-08-16 LAB — TSH: TSH: 1.16 u[IU]/mL (ref 0.450–4.500)

## 2020-10-03 ENCOUNTER — Ambulatory Visit: Payer: BC Managed Care – PPO | Attending: Nurse Practitioner

## 2020-10-03 ENCOUNTER — Other Ambulatory Visit: Payer: Self-pay

## 2020-10-03 DIAGNOSIS — R5381 Other malaise: Secondary | ICD-10-CM

## 2020-10-03 DIAGNOSIS — Z8616 Personal history of COVID-19: Secondary | ICD-10-CM | POA: Insufficient documentation

## 2020-10-03 DIAGNOSIS — M6281 Muscle weakness (generalized): Secondary | ICD-10-CM | POA: Diagnosis present

## 2020-10-03 DIAGNOSIS — R5383 Other fatigue: Secondary | ICD-10-CM | POA: Insufficient documentation

## 2020-10-03 NOTE — Therapy (Addendum)
[place], [place], [address] Phone: [phone]   Fax:  [phone]  Physical Therapy Evaluation/Discharge  Patient Details  Name: Erik Ellis MRN: [id number] Date of Birth: [birth year] Referring Provider (PT): Fenton Foy, NP   Encounter Date: 10/03/2020   PT End of Session - 10/03/20 2321    Visit Number 1    Number of Visits 9    Date for PT Re-Evaluation 12/09/20    Authorization Type [place] PPO    PT Start Time [phone]    PT Stop Time 1925    PT Time Calculation (min) 48 min    Activity Tolerance Patient tolerated treatment well    Behavior During Therapy [facility] for tasks assessed/performed           Past Medical History:  Diagnosis Date  . Asthma     Past Surgical History:  Procedure Laterality Date  . CHOLECYSTECTOMY    . KNEE ARTHROSCOPY    . LUMBAR DISC SURGERY    . NECK SURGERY    . REPLACEMENT TOTAL KNEE      There were no vitals filed for this visit.    Subjective Assessment - 10/03/20 2314    Subjective Pt reports since having covid in August he has been experiencing fatigue, depression and feeling lousy. Additionally, he feels he his not a sharp mentally with word loss and forgetfulness. Pt expressed concerned that his medications are also contributing to his issues.    Currently in Pain? No/denies              [facility] PT Assessment - 10/03/20 0001      Assessment   Medical Diagnosis History of COVID-19;Fatigue,unspecified type; Physical deconditioning    Referring Provider (PT) Fenton Foy, NP    Onset Date/Surgical Date 07/01/20    Hand Dominance Left    Next MD Visit 10/16/20    Prior Therapy no      Precautions   Precautions None      Restrictions   Weight Bearing Restrictions No      Balance Screen   Has the patient fallen in the past 6 months No      [place] residence    Living Arrangements  Spouse/significant other    Type of [place] Access Level entry    Home Layout Two level    Alternate Level Stairs-Number of Steps 14    Alternate Level Stairs-Rails Right      Prior Function   Level of Independence Independent    Vocation Full time employment    Designer, industrial/product      Cognition   Overall Cognitive Status Impaired/Different from baseline      Sensation   Light Touch Appears Intact      ROM / Strength   AROM / PROM / Strength AROM;Strength      AROM   Overall AROM Comments UE and LE AROMs are WFLs      Strength   Overall Strength Comments Bilat hip strength 4/5 and knee strength 4+5      Transfers   Transfers Sit to Stand;Stand to Sit    Sit to Stand 7: Independent    Five time sit to stand comments  6.2      Ambulation/Gait   Ambulation/Gait Yes    Ambulation/Gait Assistance 7: Independent    Gait Pattern Within Functional Limits;Step-through pattern  6 Minute Walk- Baseline   6 Minute Walk- Baseline yes    HR (bpm) 70    02 Sat (%RA) 99 %    Perceived Rate of Exertion (Borg) 6-      6 Minute walk- Post Test   6 Minute Walk Post Test yes    HR (bpm) 97    02 Sat (%RA) 97 %    Perceived Rate of Exertion (Borg) 13- Somewhat hard      6 minute walk test results    Aerobic Endurance Distance Walked 1850                      Objective measurements completed on examination: See above findings.               PT Education - 10/03/20 2318    Education Details Eval findings, POC, HEP for LE strengthening and walking program alternating days. Pt is to walk 15 to 20 mins every other day at a Somewhat Hard RPE, and progress gradually every 3-4 days until he is walking 30 to 45 mins as tolerated.    Person(s) Educated Patient    Methods Explanation;Demonstration;Tactile cues;Verbal cues;Handout;Other (comment)    Comprehension Verbalized understanding;Returned demonstration;Verbal cues  required;Tactile cues required;Need further instruction            PT Short Term Goals - 10/03/20 2340      PT SHORT TERM GOAL #1   Title Pt will be Ind in an inital HEP and walking program    Baseline Started on eval    Status New    Target Date 10/24/20             PT Long Term Goals - 10/03/20 2341      PT LONG TERM GOAL #1   Title Pt's 6 MWT distance will improve to 1930f with pt's RPE not exceeding a Somewhat Hard level    Status New    Target Date 12/09/20      PT LONG TERM GOAL #2   Title Pt's LE strength will improve to 5/5    Baseline hips 4/5, knees 4+/5    Status New    Target Date 12/09/20      PT LONG TERM GOAL #3   Title Pt will report his symptoms of fatigue and decreased tolerance to activity will be improved by 50%    Status New    Target Date 12/09/20      PT LONG TERM GOAL #4   Title Pt will be Ind in a final HEP to maintain or progress his achieved LOF.    Status New    Target Date 12/09/20                  Plan - 10/03/20 2325    Clinical Impression Statement Pt presents to PT 3 months after having covid. Today's assessment found decreasd LE strength and minimally decreased activity tolerance with the 6MWT. Aditionally, pt is experiencing significant fatgue with his needing to lie down after work. Pt will benefit home PT to address strength and activity tolerance issues.    Personal Factors and Comorbidities Comorbidity 1    Comorbidities asthma    Examination-Activity Limitations Stand;Locomotion Level    Examination-Participation Restrictions Occupation;Interpersonal Relationship    Stability/Clinical Decision Making Stable/Uncomplicated    Clinical Decision Making Low    Rehab Potential Good    PT Frequency Other (comment)   1x every 2 weeks   PT Duration  8 weeks    PT Treatment/Interventions ADLs/Self Care Home Management;Therapeutic activities;Therapeutic exercise;Functional mobility training;Patient/family education    PT  Next Visit Plan Assess response to LE strengthening exs and walking progream    PT Home Exercise Plan [id number]: LE strengthening exs           Patient will benefit from skilled therapeutic intervention in order to improve the following deficits and impairments:  Difficulty walking, Decreased endurance, Decreased strength  Visit Diagnosis: Physical deconditioning - Plan: PT plan of care cert/re-cert  Fatigue, unspecified type - Plan: PT plan of care cert/re-cert  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 Fatigue 08/15/2020  . Physical deconditioning 08/15/2020  . History of COVID-19 08/15/2020  . COVID-19 virus infection 07/03/2020  . Moderate persistent asthma without complication 63/78/5885  . Non-allergic rhinitis 05/12/2020  . Lithium use 10/04/2019  . 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 Mild renal insufficiency 10/04/2019  . Frequent headaches 07/03/2016  . Elevated serum creatinine 05/02/2016  . Allergic rhinitis 02/17/2016  . Asthma with acute exacerbation 05/31/2015  . Bipolar disorder ([place]) 05/31/2015  . Gastroesophageal reflux disease 05/31/2015  . Asthma, mild persistent 04/27/2013  . Diverticulosis 09/02/2012  . H/O colonoscopy with polypectomy 09/02/2012  . Sleep disorder 12/04/2011    Gar Ponto MS, PT 10/03/20 11:51 PM   PHYSICAL THERAPY DISCHARGE SUMMARY  Visits from Start of Care: 1  Current functional level related to goals / functional outcomes: Unknown: pt did not return after eval   Remaining deficits: Unknown: pt did not return after eval   Education / Equipment: HEP Plan: Patient agrees to discharge.  Patient goals were not met. Patient is being discharged due to not returning since the last visit.  ?????       [place], [place], [address] Phone: [phone]   Fax:  [phone]  Name: Erik Ellis MRN: [id number] Date of Birth: September 29, 1966

## 2020-10-15 ENCOUNTER — Encounter: Payer: Self-pay | Admitting: Allergy & Immunology

## 2020-10-16 ENCOUNTER — Ambulatory Visit
Admission: RE | Admit: 2020-10-16 | Discharge: 2020-10-16 | Disposition: A | Discharge status: Home / Self Care | Payer: BC Managed Care – PPO | Source: Ambulatory Visit | Attending: Nurse Practitioner | Admitting: Nurse Practitioner

## 2020-10-16 ENCOUNTER — Ambulatory Visit (INDEPENDENT_AMBULATORY_CARE_PROVIDER_SITE_OTHER): Payer: BC Managed Care – PPO | Admitting: Nurse Practitioner

## 2020-10-16 ENCOUNTER — Other Ambulatory Visit: Payer: Self-pay

## 2020-10-16 VITALS — BP 142/98 | HR 81 | Temp 97.7°F | Ht 67.5 in | Wt 175.0 lb

## 2020-10-16 DIAGNOSIS — E162 Hypoglycemia, unspecified: Secondary | ICD-10-CM

## 2020-10-16 DIAGNOSIS — M542 Cervicalgia: Secondary | ICD-10-CM

## 2020-10-16 DIAGNOSIS — Z8616 Personal history of COVID-19: Secondary | ICD-10-CM

## 2020-10-16 NOTE — Patient Instructions (Signed)
History of COVID-19  Stay well hydrated  Stay active  Deep breathing exercises  May start vitamin C 2,000 mg daily, vitamin D3 2,000 IU daily, Zinc 220 mg daily, and Quercetin 500 mg twice daily  May take tylenol or fever or pain   Physical Deconditioning: Leg weakness:  Glad you are improving  Continue PT   Brain Fog:  Hand out given on memory care  If this worsens or does not improve will order cognitive rehabilitation  Neck Pain:  Will order xray    Follow up:  Follow up in 1 months or sooner if needed

## 2020-10-16 NOTE — Progress Notes (Signed)
@Patient  ID: , male    DOB: Aug 11, 1966, 54 y.o.   MRN: [id number]  Chief Complaint  Patient presents with  . Follow-up    Still having fatigue and leg weakness. Started having some brain fog and left arm weakness. Questioning if he should get booster shot, received infusion 9/1.    Referring provider: 11/1, DO   54 year old male with history of asthma, GERD, bipolar. Diagnosed with Covid 07/02/20.    HPI  Patient presents today for post COVID care clinic visit follow-up.  Patient states that overall since he was seen here on 08/15/2020 he has improved.  He does still have some leg weakness but states that physical therapy is helping with this as well as the fatigue.  Patient did start having increased brain fog since last visit but he also states that his dosage of lithium was changed around the same time he started having brain fog.  He does have an appointment scheduled with the doctor that is prescribing the lithium today and will discuss this with them.  We discussed that this beat could be in part from Covid and in part from the dose change in medication.  Patient also states that he is start developed neck pain with popping and cracking in his neck.  He denies any recent injury.  He does have a history of neck surgery in the past. We will check c-spine xray today. Denies f/c/s, n/v/d, hemoptysis, PND, chest pain or edema.        No Known Allergies  Immunization History  Administered Date(s) Administered  . PFIZER SARS-COV-2 Vaccination 01/09/2020, 01/30/2020    Past Medical History:  Diagnosis Date  . Asthma     Tobacco History: Social History   Tobacco Use  Smoking Status Never Smoker  Smokeless Tobacco Never Used   Counseling given: Yes   Outpatient Encounter Medications as of 10/16/2020  Medication Sig  . albuterol (VENTOLIN HFA) 108 (90 Base) MCG/ACT inhaler Inhale 2 puffs into the lungs every 4 (four) hours as needed. For shortness  of breath/wheezing  . amphetamine-dextroamphetamine (ADDERALL) 10 MG tablet   . buPROPion (WELLBUTRIN XL) 300 MG 24 hr tablet Take 1 tablet by mouth daily.  . busPIRone (BUSPAR) 15 MG tablet Take 30 mg by mouth 2 (two) times daily.  . diclofenac (VOLTAREN) 75 MG EC tablet Take 1 tablet (75 mg total) by mouth 2 (two) times daily.  10/18/2020 FASENRA PEN 30 MG/ML SOAJ INJECT 1 PEN UNDER THE SKIN AT WEEK 0, 4 AND 8, THEN INJECT 1 PEN EVERY 8 WEEKS THEREAFTER.  . fluticasone (FLONASE) 50 MCG/ACT nasal spray SPRAY 2 SPRAYS INTO EACH NOSTRIL EVERY DAY  . Fluticasone-Umeclidin-Vilant (TRELEGY ELLIPTA) 200-62.5-25 MCG/INH AEPB 1 puff once daily  . gabapentin (NEURONTIN) 300 MG capsule Take 300 mg by mouth at bedtime.  . lamoTRIgine (LAMICTAL) 200 MG tablet Take 200 mg by mouth 2 (two) times daily.  12-03-1985 lithium carbonate (LITHOBID) 300 MG CR tablet Take 1,200 mg by mouth at bedtime.  . montelukast (SINGULAIR) 10 MG tablet TAKE 1 TABLET BY MOUTH EVERYDAY AT BEDTIME  . pantoprazole (PROTONIX) 40 MG tablet Take 1 tablet (40 mg total) by mouth daily.  . sildenafil (VIAGRA) 50 MG tablet TAKE 1 TABLET BY MOUTH AS NEEDED AN HOUR BEFORE SEX. DO NOT TAKE MORE THAN 1 TIME IN 24 HOURS. THIS IS NOT A DAILY MEDICINE.  Marland Kitchen XIIDRA 5 % SOLN INSTILL 1 DROP INTO BOTH EYES TWICE A DAY 2  MINUTE LID CLOSURE AFTER INSTILLING  . zolpidem (AMBIEN) 10 MG tablet Take 5-10 mg by mouth at bedtime.   Marland Kitchen albuterol (PROVENTIL) (2.5 MG/3ML) 0.083% nebulizer solution Take 3 mLs (2.5 mg total) by nebulization every 4 (four) hours as needed for wheezing or shortness of breath. (Patient not taking: No sig reported)  . EPINEPHrine 0.3 mg/0.3 mL IJ SOAJ injection Inject 0.3 mLs (0.3 mg total) into the muscle once as needed for anaphylaxis. (Patient not taking: No sig reported)   No facility-administered encounter medications on file as of 10/16/2020.     Review of Systems  Review of Systems  Constitutional: Positive for fatigue. Negative for fever.   HENT: Negative.   Respiratory: Negative for cough and shortness of breath.   Cardiovascular: Negative.  Negative for chest pain, palpitations and leg swelling.  Gastrointestinal: Negative.   Allergic/Immunologic: Negative.   Neurological: Positive for weakness.  Psychiatric/Behavioral: Positive for decreased concentration.       Physical Exam  BP (!) 142/98 (BP Location: Left Arm)   Pulse 81   Temp 97.7 F (36.5 C)   Ht 5' 7.5" (1.715 m)   Wt 175 lb (79.4 kg)   SpO2 99%   BMI 27.00 kg/m   Wt Readings from Last 5 Encounters:  10/16/20 175 lb (79.4 kg)  08/15/20 175 lb (79.4 kg)  08/04/20 169 lb (76.7 kg)  07/28/20 170 lb 6.4 oz (77.3 kg)  05/12/20 174 lb 9.6 oz (79.2 kg)     Physical Exam Vitals and nursing note reviewed.  Constitutional:      General: He is not in acute distress.    Appearance: He is well-developed and well-nourished.  Cardiovascular:     Rate and Rhythm: Normal rate and regular rhythm.  Pulmonary:     Effort: Pulmonary effort is normal.     Breath sounds: Normal breath sounds.  Musculoskeletal:     Right lower leg: No edema.     Left lower leg: No edema.  Skin:    General: Skin is warm and dry.  Neurological:     Mental Status: He is alert and oriented to person, place, and time.  Psychiatric:        Mood and Affect: Mood and affect and mood normal.        Behavior: Behavior normal.       Imaging: DG Cervical Spine Complete  Result Date: 10/16/2020 CLINICAL DATA:  Neck pain. EXAM: CERVICAL SPINE - COMPLETE 4+ VIEW COMPARISON:  September 06, 2008. FINDINGS: Status post surgical anterior fusion of C6-7. Moderate degenerative disc disease is noted at C5-6 with anterior osteophyte formation. No fracture or spondylolisthesis is noted. No prevertebral soft tissue swelling is noted. No significant neural foraminal stenosis is noted. IMPRESSION: Postsurgical and degenerative changes as described above. No acute abnormality seen in the cervical  spine. Electronically Signed   By: Lupita Raider M.D.   On: 10/16/2020 19:28     Assessment & Plan:   History of COVID-19 Stay well hydrated  Stay active  Deep breathing exercises  May start vitamin C 2,000 mg daily, vitamin D3 2,000 IU daily, Zinc 220 mg daily, and Quercetin 500 mg twice daily  May take tylenol or fever or pain   Physical Deconditioning: Leg weakness:  Glad you are improving  Continue PT   Brain Fog:  Hand out given on memory care  If this worsens or does not improve will order cognitive rehabilitation  Neck Pain:  Will order xray  Follow up:  Follow up in 1 months or sooner if needed       Ivonne Andrew, NP 10/17/2020

## 2020-10-17 LAB — COMPREHENSIVE METABOLIC PANEL
ALT: 16 IU/L (ref 0–44)
AST: 14 IU/L (ref 0–40)
Albumin/Globulin Ratio: 1.8 (ref 1.2–2.2)
Albumin: 4.5 g/dL (ref 3.8–4.9)
Alkaline Phosphatase: 112 IU/L (ref 44–121)
BUN/Creatinine Ratio: 21 — ABNORMAL HIGH (ref 9–20)
BUN: 24 mg/dL (ref 6–24)
Bilirubin Total: 0.3 mg/dL (ref 0.0–1.2)
CO2: 25 mmol/L (ref 20–29)
Calcium: 10 mg/dL (ref 8.7–10.2)
Chloride: 104 mmol/L (ref 96–106)
Creatinine, Ser: 1.16 mg/dL (ref 0.76–1.27)
GFR calc Af Amer: 81 mL/min/{1.73_m2} (ref >59)
GFR calc non Af Amer: 70 mL/min/{1.73_m2} (ref >59)
Globulin, Total: 2.5 g/dL (ref 1.5–4.5)
Glucose: 77 mg/dL (ref 65–99)
Potassium: 4.9 mmol/L (ref 3.5–5.2)
Sodium: 142 mmol/L (ref 134–144)
Total Protein: 7 g/dL (ref 6.0–8.5)

## 2020-10-17 NOTE — Assessment & Plan Note (Signed)
Stay well hydrated  Stay active  Deep breathing exercises  May start vitamin C 2,000 mg daily, vitamin D3 2,000 IU daily, Zinc 220 mg daily, and Quercetin 500 mg twice daily  May take tylenol or fever or pain   Physical Deconditioning: Leg weakness:  Glad you are improving  Continue PT   Brain Fog:  Hand out given on memory care  If this worsens or does not improve will order cognitive rehabilitation  Neck Pain:  Will order xray    Follow up:  Follow up in 1 months or sooner if needed

## 2020-10-18 ENCOUNTER — Encounter: Payer: Self-pay | Admitting: Allergy & Immunology

## 2020-10-18 ENCOUNTER — Other Ambulatory Visit: Payer: Self-pay

## 2020-10-18 ENCOUNTER — Ambulatory Visit (INDEPENDENT_AMBULATORY_CARE_PROVIDER_SITE_OTHER): Payer: BC Managed Care – PPO | Admitting: Allergy & Immunology

## 2020-10-18 VITALS — BP 120/68 | HR 75 | Temp 99.0°F | Resp 18 | Ht 68.0 in | Wt 175.2 lb

## 2020-10-18 DIAGNOSIS — K219 Gastro-esophageal reflux disease without esophagitis: Secondary | ICD-10-CM

## 2020-10-18 DIAGNOSIS — U099 Post covid-19 condition, unspecified: Secondary | ICD-10-CM

## 2020-10-18 DIAGNOSIS — J4540 Moderate persistent asthma, uncomplicated: Secondary | ICD-10-CM

## 2020-10-18 DIAGNOSIS — J310 Chronic rhinitis: Secondary | ICD-10-CM

## 2020-10-18 NOTE — Patient Instructions (Addendum)
1. Moderate persistent asthma without complication - Lung function looks great today.  - I am happy with how your breathing is going.  - Daily controller medication(s): Singulair 10mg  daily and Trelegy 200/62.5/25 one puff once daily and Fasenra (every 8 weeks) - Prior to physical activity: ProAir 2 puffs 10-15 minutes before physical activity. - Rescue medications: ProAir 4 puffs every 4-6 hours as needed - Asthma control goals:  * Full participation in all desired activities (may need albuterol before activity) * Albuterol use two time or less a week on average (not counting use with activity) * Cough interfering with sleep two time or less a month * Oral steroids no more than once a year * No hospitalizations  2. Non-allergic rhinitis - We will not make any medication changes at all.  - Continue with: Flonase (fluticasone) one spray per nostril daily   3. Return in about 4 months (around 02/16/2021).    Please inform 02/18/2021 of any Emergency Department visits, hospitalizations, or changes in symptoms. Call us before going to the ED for breathing or allergy symptoms since we might be able to fit you in for a sick visit. Feel free to contact us anytime with any questions, problems, or concerns.  It was a pleasure to see you again today!  Websites that have reliable patient information: 1. American Academy of Asthma, Allergy, and Immunology: www.aaaai.org 2. Food Allergy Research and Education (FARE): foodallergy.org 3. Mothers of Asthmatics: http://www.asthmacommunitynetwork.org 4. American College of Allergy, Asthma, and Immunology: www.acaai.org   COVID-19 Vaccine Information can be found at: [place] For questions related to vaccine distribution or appointments, please email [email or web address] or call [phone].     Like [phone] on [place] and Instagram for our latest updates!     HAPPY FALL!     Make  sure you are registered to vote! If you have moved or changed any of your contact information, you will need to get this updated before voting!  In some cases, you MAY be able to register to vote online: [employer]

## 2020-10-18 NOTE — Progress Notes (Signed)
FOLLOW UP  Date of Service/Encounter:  10/18/20   Assessment:   Moderate persistent asthma without complication- alpha-1 antitrpysin and Aspergillus precipitinsall normal  Non-allergic rhinitis  COVID19 - s/p monoclonal antibody infusion with prolonged recovery phase  Bipolar disorder - on lithium   Chronic neck pain with surgery ten years ago and now withh DDD  Plan/Recommendations:   sistent asthma without complication - Lung function looks great today.  - I am happy with how your breathing is going.  - Daily controller medication(s): Singulair 10mg  daily and Trelegy 200/62.5/25 one puff once daily and Fasenra (every 8 weeks) - Prior to physical activity: ProAir 2 puffs 10-15 minutes before physical activity. - Rescue medications: ProAir 4 puffs every 4-6 hours as needed - Asthma control goals:  * Full participation in all desired activities (may need albuterol before activity) * Albuterol use two time or less a week on average (not counting use with activity) * Cough interfering with sleep two time or less a month * Oral steroids no more than once a year * No hospitalizations  2. Non-allergic rhinitis - We will not make any medication changes at all.  - Continue with: Flonase (fluticasone) one spray per nostril daily   3. Return in about 4 months (around 02/16/2021).   Subjective:   Erik Ellis is a 54 y.o. male presenting today for follow up of  Chief Complaint  Patient presents with  . Asthma    Erik Ellis has a history of the following: Patient Active Problem List   Diagnosis Date Noted  . Fatigue 08/15/2020  . Physical deconditioning 08/15/2020  . History of COVID-19 08/15/2020  . COVID-19 virus infection 07/03/2020  . Moderate persistent asthma without complication 05/12/2020  . Non-allergic rhinitis 05/12/2020  . Lithium use 10/04/2019  . 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 Mild renal insufficiency 10/04/2019  . Frequent headaches  07/03/2016  . Elevated serum creatinine 05/02/2016  . Allergic rhinitis 02/17/2016  . Asthma with acute exacerbation 05/31/2015  . Bipolar disorder (HCC) 05/31/2015  . Gastroesophageal reflux disease 05/31/2015  . Asthma, mild persistent 04/27/2013  . Diverticulosis 09/02/2012  . H/O colonoscopy with polypectomy 09/02/2012  . Sleep disorder 12/04/2011    History obtained from: chart review and patient.  Erik Ellis is a 54 y.o. male presenting for a follow up visit. He was last seen in September 2021. At that time, he was still recovering from COVID19. He had received an antibody infusion and we had given him prednisone. We gave a course of Decadron to see if this would provide more relief. We did a CXR that demonstrated a normal film.   Since the last visit, he has mostly done well. But he continues to have some intense brain fog. The SOB has improved. But he will have trouble finding words which is quite frustrating for him. He is going to see a Neurologist soon to see if they can provide some more guidance.  Asthma/Respiratory Symptom History: He remains on the Trelegy 1 puff once daily.  He is also on the [place].  He thinks maybe a little behind in the [place], but I cannot tell from IM because he gets his own injections at home he does not think he has that leg.  He has not needed any more steroids since last time I saw him.  ACT score is 16, indicating poor asthma control.  He has not been to the hospital and has not been admitted for his breathing.  Allergic Rhinitis Symptom History:  He has not needed antibiotics at all since last we saw him.  He continues on Flonase 1 spray per nostril daily.  He sometimes uses nasal saline rinses, but overall his symptoms are fairly well controlled.  Review of his prednisone use shows 2 courses in 2017, 2 courses in 2018, 4 courses in 2019, 4 courses in 2020, and 5 courses thus far in 2021.  However, it should be noted that 2 of the 5 courses in 2021 were  prescribed by myself right before he was diagnosed with COVID-19, which I am not sure Erik Ellis would have affected anyway.  His first dose of Erik Ellis was in December 2020.  He has a history of bipolar depression and is on lithium. He had this changed around over the summer but was calling people by the wrong name, etc.   Otherwise, there have been no changes to his past medical history, surgical history, family history, or social history.    Review of Systems  Constitutional: Negative.  Negative for fever, malaise/fatigue and weight loss.  HENT: Negative.  Negative for congestion, ear discharge and ear pain.   Eyes: Negative for pain, discharge and redness.  Respiratory: Negative for cough, sputum production, shortness of breath and wheezing.   Cardiovascular: Negative.  Negative for chest pain and palpitations.  Gastrointestinal: Negative for abdominal pain and heartburn.  Skin: Negative.  Negative for itching and rash.  Neurological: Negative for dizziness and headaches.  Endo/Heme/Allergies: Negative for environmental allergies. Does not bruise/bleed easily.  Psychiatric/Behavioral: Positive for memory loss.       Objective:   Blood pressure 120/68, pulse 75, temperature 99 F (37.2 C), temperature source Temporal, resp. rate 18, height 5\' 8"  (1.727 m), weight 175 lb 3.2 oz (79.5 kg), SpO2 97 %. Body mass index is 26.64 kg/m.   Physical Exam:  Physical Exam Constitutional:      Appearance: He is well-developed.     Comments: Pleasant male.  Very talkative.  HENT:     Head: Normocephalic and atraumatic.     Right Ear: Tympanic membrane, ear canal and external ear normal.     Left Ear: Tympanic membrane, ear canal and external ear normal.     Nose: No nasal deformity, septal deviation, mucosal edema, rhinorrhea or epistaxis.     Right Turbinates: Enlarged and swollen.     Left Turbinates: Enlarged and swollen.     Right Sinus: No maxillary sinus tenderness or frontal sinus  tenderness.     Left Sinus: No maxillary sinus tenderness or frontal sinus tenderness.     Mouth/Throat:     Mouth: Oropharynx is clear and moist. Mucous membranes are not pale and not dry.     Pharynx: Uvula midline.  Eyes:     General:        Right eye: No discharge.        Left eye: No discharge.     Extraocular Movements: EOM normal.     Conjunctiva/sclera: Conjunctivae normal.     Right eye: Right conjunctiva is not injected. No chemosis.    Left eye: Left conjunctiva is not injected. No chemosis.    Pupils: Pupils are equal, round, and reactive to light.  Cardiovascular:     Rate and Rhythm: Normal rate and regular rhythm.     Heart sounds: Normal heart sounds.  Pulmonary:     Effort: Pulmonary effort is normal. No tachypnea, accessory muscle usage or respiratory distress.     Breath sounds: Normal breath sounds. No wheezing,  rhonchi or rales.     Comments: Moving air well in all lung fields.  No increased work of breathing. Chest:     Chest wall: No tenderness.  Lymphadenopathy:     Cervical: No cervical adenopathy.  Skin:    Coloration: Skin is not pale.     Findings: No abrasion, erythema, petechiae or rash. Rash is not papular, urticarial or vesicular.     Comments: No eczematous or urticarial lesions noted.  Neurological:     Mental Status: He is alert.  Psychiatric:        Mood and Affect: Mood and affect normal.      Diagnostic studies: none      Malachi Bonds, MD  [facility]

## 2020-10-19 ENCOUNTER — Encounter: Payer: Self-pay | Admitting: Allergy & Immunology

## 2020-11-03 ENCOUNTER — Ambulatory Visit (HOSPITAL_COMMUNITY)
Admission: EM | Admit: 2020-11-03 | Discharge: 2020-11-03 | Disposition: A | Discharge status: Home / Self Care | Payer: BC Managed Care – PPO | Attending: Internal Medicine | Admitting: Internal Medicine

## 2020-11-03 ENCOUNTER — Other Ambulatory Visit: Payer: Self-pay

## 2020-11-03 ENCOUNTER — Encounter (HOSPITAL_COMMUNITY): Payer: Self-pay

## 2020-11-03 DIAGNOSIS — G4486 Cervicogenic headache: Secondary | ICD-10-CM

## 2020-11-03 MED ORDER — CYCLOBENZAPRINE HCL 5 MG PO TABS: 5.0000 mg | ORAL_TABLET | Freq: Three times a day (TID) | ORAL | 0 refills | Status: DC | PRN

## 2020-11-03 NOTE — ED Provider Notes (Signed)
[facility]    CSN: [id number] Arrival date & time: 11/03/20  3329      History   Chief Complaint Chief Complaint  Patient presents with  . Headache    HPI Erik Ellis is a 54 y.o. male to the urgent care with a history of neck pain with headaches for the past couple of weeks.  No trauma or falls.  Patient has history of C5/C6 fusion.  Cervical spine done a couple of weeks ago shows moderate degenerative disc disease in the cervical spine.  He denies any numbness or tingling in the hands.  No weakness in the hands.  Pain is constant, throbbing, currently  mild to moderate.  Is been taking NSAIDs with minimal improvement.  Pain is associated with some neck stiffness.  He also complains of increasing forgetfulness over the past couple of months.  Patient had COVID-19 infection in August and after recovery he feels that his memory has been affected.  He is more forgetful.  And has difficulty recollecting words.  HPI  Past Medical History:  Diagnosis Date  . Asthma     Patient Active Problem List   Diagnosis Date Noted  . Fatigue 08/15/2020  . Physical deconditioning 08/15/2020  . History of COVID-19 08/15/2020  . COVID-19 virus infection 07/03/2020  . Moderate persistent asthma without complication 05/12/2020  . Non-allergic rhinitis 05/12/2020  . Lithium use 10/04/2019  . 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 Mild renal insufficiency 10/04/2019  . Frequent headaches 07/03/2016  . Elevated serum creatinine 05/02/2016  . Allergic rhinitis 02/17/2016  . Asthma with acute exacerbation 05/31/2015  . Bipolar disorder (HCC) 05/31/2015  . Gastroesophageal reflux disease 05/31/2015  . Asthma, mild persistent 04/27/2013  . Diverticulosis 09/02/2012  . H/O colonoscopy with polypectomy 09/02/2012  . Sleep disorder 12/04/2011    Past Surgical History:  Procedure Laterality Date  . CHOLECYSTECTOMY    . KNEE ARTHROSCOPY    . LUMBAR DISC SURGERY    . NECK SURGERY     . REPLACEMENT TOTAL KNEE         Home Medications    Prior to Admission medications   Medication Sig Start Date End Date Taking? Authorizing Provider  amphetamine-dextroamphetamine (ADDERALL) 10 MG tablet  07/24/16  Yes [provider]  buPROPion (WELLBUTRIN XL) 300 MG 24 hr tablet Take 1 tablet by mouth daily. 07/27/14  Yes [provider]  busPIRone (BUSPAR) 15 MG tablet Take 30 mg by mouth 2 (two) times daily. 07/24/14  Yes [provider]  FASENRA PEN 30 MG/ML SOAJ INJECT 1 PEN UNDER THE SKIN AT WEEK 0, 4 AND 8, THEN INJECT 1 PEN EVERY 8 WEEKS THEREAFTER. 11/22/19  Yes Alfonse Spruce, MD  gabapentin (NEURONTIN) 300 MG capsule Take 300 mg by mouth at bedtime. 03/24/16  Yes [provider]  albuterol (PROVENTIL) (2.5 MG/3ML) 0.083% nebulizer solution Take 3 mLs (2.5 mg total) by nebulization every 4 (four) hours as needed for wheezing or shortness of breath. 07/25/20   Alfonse Spruce, MD  albuterol (VENTOLIN HFA) 108 (90 Base) MCG/ACT inhaler Inhale 2 puffs into the lungs every 4 (four) hours as needed. For shortness of breath/wheezing 08/04/19   Merlyn Albert, MD  diclofenac (VOLTAREN) 75 MG EC tablet Take 1 tablet (75 mg total) by mouth 2 (two) times daily. 08/04/20   Ladona Ridgel, Malena M, DO  EPINEPHrine 0.3 mg/0.3 mL IJ SOAJ injection Inject 0.3 mLs (0.3 mg total) into the muscle once as needed for anaphylaxis.  05/12/20   Hetty Blend, FNP  fluticasone (FLONASE) 50 MCG/ACT nasal spray SPRAY 2 SPRAYS INTO EACH NOSTRIL EVERY DAY 07/14/20   Alfonse Spruce, MD  Fluticasone-Umeclidin-Vilant (TRELEGY ELLIPTA) 200-62.5-25 MCG/INH AEPB 1 puff once daily 06/27/20   Alfonse Spruce, MD  lamoTRIgine (LAMICTAL) 200 MG tablet Take 200 mg by mouth 2 (two) times daily. 08/14/14   [provider]  lithium carbonate (LITHOBID) 300 MG CR tablet Take 1,200 mg by mouth at bedtime. 07/06/14   [provider]  montelukast (SINGULAIR) 10 MG  tablet TAKE 1 TABLET BY MOUTH EVERYDAY AT BEDTIME 07/13/20   Alfonse Spruce, MD  pantoprazole (PROTONIX) 40 MG tablet Take 1 tablet (40 mg total) by mouth daily. 07/18/20   Ladona Ridgel, Malena M, DO  sildenafil (VIAGRA) 50 MG tablet TAKE 1 TABLET BY MOUTH AS NEEDED AN HOUR BEFORE SEX. DO NOT TAKE MORE THAN 1 TIME IN 24 HOURS. THIS IS NOT A DAILY MEDICINE. 09/16/18   [provider]  XIIDRA 5 % SOLN INSTILL 1 DROP INTO BOTH EYES TWICE A DAY 2 MINUTE LID CLOSURE AFTER INSTILLING 08/13/18   [provider]  zolpidem (AMBIEN) 10 MG tablet Take 5-10 mg by mouth at bedtime.  07/26/14   [provider]    Family History Family History  Problem Relation Age of Onset  . Asthma Father   . Allergic rhinitis Neg Hx   . Angioedema Neg Hx   . Atopy Neg Hx   . Eczema Neg Hx   . Immunodeficiency Neg Hx   . Urticaria Neg Hx     Social History Social History   Tobacco Use  . Smoking status: Never Smoker  . Smokeless tobacco: Never Used  Vaping Use  . Vaping Use: Never used  Substance Use Topics  . Alcohol use: No  . Drug use: No     Allergies   Patient has no known allergies.   Review of Systems Review of Systems  Constitutional: Negative.   HENT: Negative.   Eyes: Negative.   Respiratory: Negative.   Cardiovascular: Negative.   Genitourinary: Negative.   Musculoskeletal: Positive for neck pain and neck stiffness.  Skin: Negative.   Neurological: Positive for headaches.  Psychiatric/Behavioral: Positive for confusion and decreased concentration.     Physical Exam Triage Vital Signs ED Triage Vitals [11/03/20 1013]  Enc Vitals Group     BP (!) 150/97     Pulse Rate 77     Resp 18     Temp 97.8 F (36.6 C)     Temp Source Oral     SpO2 99 %     Weight      Height      Head Circumference      Peak Flow      Pain Score      Pain Loc      Pain Edu?      Excl. in GC?    No data found.  Updated Vital Signs BP (!) 150/97 (BP Location: Right  Arm)   Pulse 77   Temp 97.8 F (36.6 C) (Oral)   Resp 18   SpO2 99%   Visual Acuity Right Eye Distance:   Left Eye Distance:   Bilateral Distance:    Right Eye Near:   Left Eye Near:    Bilateral Near:     Physical Exam Vitals and nursing note reviewed.  Constitutional:      General: He is not in acute distress.  Appearance: He is not ill-appearing.  Eyes:     Extraocular Movements: Extraocular movements intact.     Pupils: Pupils are equal.  Neck:     Meningeal: Brudzinski's sign absent.  Cardiovascular:     Rate and Rhythm: Normal rate and regular rhythm.  Pulmonary:     Effort: Pulmonary effort is normal.  Abdominal:     Palpations: Abdomen is soft.  Musculoskeletal:        General: Normal range of motion.     Cervical back: Normal range of motion. No rigidity.  Lymphadenopathy:     Cervical: No cervical adenopathy.  Neurological:     Mental Status: He is alert.     GCS: GCS eye subscore is 4. GCS verbal subscore is 5. GCS motor subscore is 6.     Cranial Nerves: No cranial nerve deficit or dysarthria.     Sensory: No sensory deficit.     Motor: No weakness.     Deep Tendon Reflexes: Reflexes normal.  Psychiatric:        Mood and Affect: Mood normal.      UC Treatments / Results  Labs (all labs ordered are listed, but only abnormal results are displayed) Labs Reviewed - No data to display  EKG   Radiology No results found.  Procedures Procedures (including critical care time)  Medications Ordered in UC Medications - No data to display  Initial Impression / Assessment and Plan / UC Course  I have reviewed the triage vital signs and the nursing notes.  Pertinent labs & imaging results that were available during my care of the patient were reviewed by me and considered in my medical decision making (see chart for details).     1.  Cervicogenic headache: Add Flexeril to the treatment regimen Continue NSAID use as needed Heating pad Gentle  range of motion exercises If symptoms worsen please reach out to the primary care physician to be reevaluated Patient may benefit from orthopedic surgery referral for further management  2.  Cognitive function decline: Patient will need to follow-up with primary care physician for neurology referral Currently patient has no neurologic deficits. Return precautions given. Final Clinical Impressions(s) / UC Diagnoses   Final diagnoses:  None   Discharge Instructions   None    ED Prescriptions    None     PDMP not reviewed this encounter.   Merrilee Jansky, MD 11/03/20 1213

## 2020-11-03 NOTE — Discharge Instructions (Addendum)
Please use heating pad Take medications as directed Gentle range of motion exercises If symptoms persist you will need to be evaluated by orthopedic surgery Please call your primary care physician to arrange a neurology referral/evaluation for the cognitive symptoms.

## 2020-11-03 NOTE — ED Triage Notes (Addendum)
Pt c/o posterior HA, posterior neck pain for past "15 days" that increases in pressure with bending over. Also reports feeling "brain fog" and difficulty thinking, struggling to spell common words the past couple months.  Had COVID August 2021.  Reports troubling sleeping the past week.  Denies dizziness, diaphoresis, CP, SOB, extremity weakness, change in vision, n/v.   Had cervical imaging "a couple weeks ago" 2/2 neck pain.  Smile symmetrical, shrug symmetrical/strong, grips equal/strong, leg strength equal/strong.  Pt reports it feels more difficult and "weak" to raise left arm than right. No arm drift noted. Has been taking adderall 30-40 mg instead of 5mg 

## 2020-11-07 ENCOUNTER — Ambulatory Visit: Payer: BC Managed Care – PPO

## 2020-11-08 ENCOUNTER — Encounter: Payer: Self-pay | Admitting: Family Medicine

## 2020-11-08 ENCOUNTER — Ambulatory Visit (INDEPENDENT_AMBULATORY_CARE_PROVIDER_SITE_OTHER): Payer: BC Managed Care – PPO | Admitting: Family Medicine

## 2020-11-08 ENCOUNTER — Other Ambulatory Visit: Payer: Self-pay

## 2020-11-08 VITALS — BP 130/82 | HR 98 | Temp 97.4°F | Ht 67.0 in | Wt 180.0 lb

## 2020-11-08 DIAGNOSIS — R6889 Other general symptoms and signs: Secondary | ICD-10-CM | POA: Diagnosis not present

## 2020-11-08 DIAGNOSIS — U099 Post covid-19 condition, unspecified: Secondary | ICD-10-CM

## 2020-11-08 DIAGNOSIS — G44209 Tension-type headache, unspecified, not intractable: Secondary | ICD-10-CM | POA: Diagnosis not present

## 2020-11-08 DIAGNOSIS — Z9889 Other specified postprocedural states: Secondary | ICD-10-CM | POA: Diagnosis not present

## 2020-11-08 DIAGNOSIS — R419 Unspecified symptoms and signs involving cognitive functions and awareness: Secondary | ICD-10-CM

## 2020-11-08 DIAGNOSIS — M542 Cervicalgia: Secondary | ICD-10-CM | POA: Diagnosis not present

## 2020-11-08 MED ORDER — DICLOFENAC SODIUM 75 MG PO TBEC: 75.0000 mg | DELAYED_RELEASE_TABLET | Freq: Two times a day (BID) | ORAL | 0 refills | Status: DC
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
## 2020-11-13 ENCOUNTER — Ambulatory Visit: Payer: BC Managed Care – PPO

## 2020-11-21 ENCOUNTER — Other Ambulatory Visit: Payer: Self-pay | Admitting: *Deleted

## 2020-11-21 ENCOUNTER — Encounter: Payer: Self-pay | Admitting: Allergy & Immunology

## 2020-11-21 MED ORDER — FASENRA PEN 30 MG/ML ~~LOC~~ SOAJ: SUBCUTANEOUS | 6 refills | Status: DC

## 2020-12-01 ENCOUNTER — Other Ambulatory Visit: Payer: Self-pay | Admitting: Family Medicine

## 2020-12-01 DIAGNOSIS — G44209 Tension-type headache, unspecified, not intractable: Secondary | ICD-10-CM

## 2020-12-01 DIAGNOSIS — Z9889 Other specified postprocedural states: Secondary | ICD-10-CM

## 2020-12-05 NOTE — Telephone Encounter (Signed)
See note below. Thx. Dr. Karie Schwalbe

## 2020-12-05 NOTE — Telephone Encounter (Signed)
Pls call pt to see if he was able to get a lithium level.  His ortho doctor sent me a letter about concern of pt taking diclofenac and lithium and could elevate the level of the lithium.  Was he able to get this through his psychiatrist?  (if not, we can order it and have him get it before more refills of diclofenac.)   Thx.   Dr. Ladona Ridgel

## 2020-12-06 NOTE — Telephone Encounter (Signed)
Sent mychart message to patient

## 2020-12-07 ENCOUNTER — Telehealth: Payer: Self-pay

## 2020-12-07 MED ORDER — BACLOFEN 10 MG PO TABS: ORAL_TABLET | ORAL | 0 refills | Status: DC

## 2020-12-07 NOTE — Telephone Encounter (Signed)
Intractable hiccups or not unusual but not super common  There are various ways of person can try without medications.  Hiccups If he is try these measures and still has trouble with the hiccups baclofen can help Baclofen as a muscle relaxer so therefore do not take Flexeril with baclofen Baclofen can cause drowsiness so therefore it is recommended as a home use medication May try baclofen 10 mg 1 taken 3 times daily as needed for the hiccups caution drowsiness, not recommended to drive with baclofen, #17  If ongoing troubles notify us

## 2020-12-07 NOTE — Telephone Encounter (Signed)
Patient advised per Dr Lorin Picket: Intractable hiccups or not unusual but not super common  There are various ways of person can try without medications.  Hiccups If he is try these measures and still has trouble with the hiccups baclofen can help Baclofen as a muscle relaxer so therefore do not take Flexeril with baclofen Baclofen can cause drowsiness so therefore it is recommended as a home use medication May try baclofen 10 mg 1 taken 3 times daily as needed for the hiccups caution drowsiness, not recommended to drive with baclofen, #95  Patient notified and verbalized understanding. Prescription sent electronically to pharmacy.

## 2020-12-07 NOTE — Telephone Encounter (Signed)
Got an injection, cortisone shot in his neck. Ever since then he has had non stop hiccups.  He called the doctor that did the injection and that doctor said he didn't think it was related and to call his pcp.  He has been hiccuping for 4 days, can't sleep because of it. Wants to know if there is anything he can do or take?   [facility] [address]

## 2021-01-09 ENCOUNTER — Telehealth: Payer: Self-pay | Admitting: Allergy & Immunology

## 2021-01-09 NOTE — Telephone Encounter (Signed)
Patient called and would like to return the nebulizer. because it has not been used and dont want to pay any more monthly payments. [phone].

## 2021-01-09 NOTE — Telephone Encounter (Signed)
Spoke with patient, informed him he would need to call [employer] in regards to this matter. Patient stated that he already spoke with [employer], they informed him to return it to the facility he received it and have the facility fax information informing them that the product was in fact returned before they will stop payments. Please advise.

## 2021-01-09 NOTE — Telephone Encounter (Signed)
Called [employer] and spoke to [facility] in billing.  Confirmed once patient returns nebulizer and it is confirmed by our office that machine is unopened and unused, they will stop billing statements.  [employer] needs statement from office on letterhead with patient name and date of birth stating unopened and unused faxed to [phone]. Called patient and informed him of my call to [employer].  Patient lives in [place] and works in [place].  Patient will return nebulizer to [facility] office or [place] office and is aware of days and hours.  Patient voiced understanding

## 2021-01-09 NOTE — Telephone Encounter (Signed)
Noted,  Thank you!

## 2021-01-10 NOTE — Telephone Encounter (Signed)
Patient brought nebulizer to the [place] office, nebulizer was not opened/used. I informed patient that we would write a letter and fax it to [employer]. Patient verbalized understanding

## 2021-01-12 ENCOUNTER — Other Ambulatory Visit: Payer: Self-pay | Admitting: Family Medicine

## 2021-01-12 ENCOUNTER — Other Ambulatory Visit: Payer: Self-pay | Admitting: Allergy & Immunology

## 2021-01-16 ENCOUNTER — Ambulatory Visit: Payer: BC Managed Care – PPO | Admitting: Diagnostic Neuroimaging

## 2021-01-16 NOTE — Telephone Encounter (Signed)
Letter to [employer] stating nebulizer was returned to [facility] and was unopened and not used was faxed to [phone].

## 2021-01-17 ENCOUNTER — Encounter: Payer: Self-pay | Admitting: Counselor

## 2021-01-17 ENCOUNTER — Other Ambulatory Visit: Payer: Self-pay

## 2021-01-17 ENCOUNTER — Encounter: Payer: Self-pay | Admitting: Neurology

## 2021-01-17 ENCOUNTER — Ambulatory Visit: Payer: BC Managed Care – PPO | Admitting: Neurology

## 2021-01-17 VITALS — BP 160/97 | HR 83 | Ht 69.0 in | Wt 170.0 lb

## 2021-01-17 DIAGNOSIS — R4789 Other speech disturbances: Secondary | ICD-10-CM | POA: Diagnosis not present

## 2021-01-17 DIAGNOSIS — G479 Sleep disorder, unspecified: Secondary | ICD-10-CM | POA: Diagnosis not present

## 2021-01-17 DIAGNOSIS — R419 Unspecified symptoms and signs involving cognitive functions and awareness: Secondary | ICD-10-CM

## 2021-01-17 DIAGNOSIS — F39 Unspecified mood [affective] disorder: Secondary | ICD-10-CM

## 2021-01-17 DIAGNOSIS — R351 Nocturia: Secondary | ICD-10-CM

## 2021-01-17 DIAGNOSIS — F8181 Disorder of written expression: Secondary | ICD-10-CM

## 2021-01-17 DIAGNOSIS — R519 Headache, unspecified: Secondary | ICD-10-CM

## 2021-01-17 DIAGNOSIS — Z820 Family history of epilepsy and other diseases of the nervous system: Secondary | ICD-10-CM

## 2021-01-17 DIAGNOSIS — R0683 Snoring: Secondary | ICD-10-CM

## 2021-01-17 NOTE — Patient Instructions (Addendum)
You have complaints of memory loss: memory loss or changes in cognitive function can have many reasons and does not always mean you have dementia. Conditions that can contribute to subjective or objective memory loss include: depression, stress, poor sleep from insomnia or sleep apnea, dehydration, fluctuation in blood sugar values, thyroid or electrolyte dysfunction and certain vitamin deficiencies. Dementia can be caused by stroke, brain atherosclerosis or brain vascular disease due to vascular risk factors (smoking, high blood pressure, high cholesterol, obesity and uncontrolled diabetes), certain degenerative brain disorders (including Parkinson's disease and Multiple sclerosis) and by Alzheimer's disease or other, more rare and sometimes hereditary causes. We will do some additional testing: blood work (which we will do today). You already had a brain scan through your orthopedic specialist. We will also request a formal cognitive test called neuropsychological evaluation which is done by a licensed neuropsychologist. We will make a referral in that regard. We will call you with blood test results and monitor your symptoms. Your memory loss is rather mild at this point, which, of course is reassuring.  I am worried that some of your cognitive slowness and word finding difficulty stem from taking multiple psychotropic medications.  Psychotropic medications can also affect your balance which is another complaint of yours.  Given that you have woken up with a headache and have sleep disturbance and occasional snoring, as well as cognitive complaints, I would like to proceed with a sleep study to rule out underlying obstructive sleep apnea.  If you have OSA or obstructive sleep apnea, you will likely benefit from treatment with a CPAP or AutoPap machine.
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
## 2021-01-18 NOTE — Progress Notes (Signed)
Please call patient and advise him that there were a couple of abnormalities in his blood work.  His kidney function is impaired, he may be dehydrated.  Please ask him to hydrate really well with water, 6 to 8 cups of water per day, 8 ounce size each, are generally recommended.  In addition, his thyroid screening test called TSH showed a mildly low number which can indicate over function of the thyroid gland.  I would recommend that he make a follow-up appointment with his primary care physician for more in-depth evaluation of his thyroid function and monitoring of his kidney function.  2 of his tests are pending which is the vitamin B1 and B6, which take typically a little longer.  We will update if any of them are abnormal.  The blood test results were normal.

## 2021-01-20 ENCOUNTER — Encounter: Payer: Self-pay | Admitting: Neurology

## 2021-01-20 ENCOUNTER — Other Ambulatory Visit: Payer: Self-pay | Admitting: Allergy & Immunology

## 2021-01-23 NOTE — Progress Notes (Signed)
Please also update patient about his vitamin B6, it is elevated.  He may be taking a supplement with vitamin B6 in it.  Sometimes taking too much of a certain vitamin can also cause symptoms.  I would recommend that he stop taking any supplemental vitamin B6 for now.

## 2021-01-26 LAB — COMPREHENSIVE METABOLIC PANEL
ALT: 16 IU/L (ref 0–44)
AST: 16 IU/L (ref 0–40)
Albumin/Globulin Ratio: 2 (ref 1.2–2.2)
Albumin: 4.4 g/dL (ref 3.8–4.9)
Alkaline Phosphatase: 96 IU/L (ref 44–121)
BUN/Creatinine Ratio: 25 — ABNORMAL HIGH (ref 9–20)
BUN: 35 mg/dL — ABNORMAL HIGH (ref 6–24)
Bilirubin Total: 0.4 mg/dL (ref 0.0–1.2)
CO2: 25 mmol/L (ref 20–29)
Calcium: 10.1 mg/dL (ref 8.7–10.2)
Chloride: 102 mmol/L (ref 96–106)
Creatinine, Ser: 1.41 mg/dL — ABNORMAL HIGH (ref 0.76–1.27)
Globulin, Total: 2.2 g/dL (ref 1.5–4.5)
Glucose: 83 mg/dL (ref 65–99)
Potassium: 4.6 mmol/L (ref 3.5–5.2)
Sodium: 142 mmol/L (ref 134–144)
Total Protein: 6.6 g/dL (ref 6.0–8.5)
eGFR: 58 mL/min/{1.73_m2} — ABNORMAL LOW (ref >59)

## 2021-01-26 LAB — B12 AND FOLATE PANEL
Folate: >20 ng/mL (ref >3.0)
Vitamin B-12: 556 pg/mL (ref 232–1245)

## 2021-01-26 LAB — SEDIMENTATION RATE: Sed Rate: 2 mm/hr (ref 0–30)

## 2021-01-26 LAB — VITAMIN B6: Vitamin B6: 78.1 ug/L — ABNORMAL HIGH (ref 5.3–46.7)

## 2021-01-26 LAB — RHEUMATOID FACTOR: Rheumatoid fact SerPl-aCnc: <10 IU/mL (ref <14.0)

## 2021-01-26 LAB — HGB A1C W/O EAG: Hgb A1c MFr Bld: 5.4 % (ref 4.8–5.6)

## 2021-01-26 LAB — VITAMIN D 25 HYDROXY (VIT D DEFICIENCY, FRACTURES): Vit D, 25-Hydroxy: 52.9 ng/mL (ref 30.0–100.0)

## 2021-01-26 LAB — ANA W/REFLEX: Anti Nuclear Antibody (ANA): NEGATIVE

## 2021-01-26 LAB — VITAMIN B1: Thiamine: 235.4 nmol/L — ABNORMAL HIGH (ref 66.5–200.0)

## 2021-01-26 LAB — RPR: RPR Ser Ql: NONREACTIVE

## 2021-01-26 LAB — TSH: TSH: 0.357 u[IU]/mL — ABNORMAL LOW (ref 0.450–4.500)

## 2021-01-26 LAB — C-REACTIVE PROTEIN: CRP: <1 mg/L (ref 0–10)

## 2021-01-29 ENCOUNTER — Ambulatory Visit: Payer: BC Managed Care – PPO | Admitting: Family Medicine

## 2021-01-30 ENCOUNTER — Other Ambulatory Visit: Payer: Self-pay

## 2021-01-30 ENCOUNTER — Ambulatory Visit (INDEPENDENT_AMBULATORY_CARE_PROVIDER_SITE_OTHER): Payer: BC Managed Care – PPO | Admitting: Neurology

## 2021-01-30 DIAGNOSIS — G479 Sleep disorder, unspecified: Secondary | ICD-10-CM

## 2021-01-30 DIAGNOSIS — F39 Unspecified mood [affective] disorder: Secondary | ICD-10-CM

## 2021-01-30 DIAGNOSIS — R4789 Other speech disturbances: Secondary | ICD-10-CM

## 2021-01-30 DIAGNOSIS — R419 Unspecified symptoms and signs involving cognitive functions and awareness: Secondary | ICD-10-CM

## 2021-01-30 DIAGNOSIS — R351 Nocturia: Secondary | ICD-10-CM

## 2021-01-30 DIAGNOSIS — Z820 Family history of epilepsy and other diseases of the nervous system: Secondary | ICD-10-CM

## 2021-01-30 DIAGNOSIS — G4720 Circadian rhythm sleep disorder, unspecified type: Secondary | ICD-10-CM

## 2021-01-30 DIAGNOSIS — R0683 Snoring: Secondary | ICD-10-CM | POA: Diagnosis not present

## 2021-01-30 DIAGNOSIS — F8181 Disorder of written expression: Secondary | ICD-10-CM

## 2021-01-30 DIAGNOSIS — R519 Headache, unspecified: Secondary | ICD-10-CM

## 2021-02-01 ENCOUNTER — Encounter: Payer: Self-pay | Admitting: Family Medicine

## 2021-02-01 ENCOUNTER — Ambulatory Visit: Payer: BC Managed Care – PPO | Admitting: Family Medicine

## 2021-02-01 ENCOUNTER — Other Ambulatory Visit: Payer: Self-pay

## 2021-02-01 VITALS — BP 158/97 | HR 96 | Temp 98.4°F | Ht 69.0 in | Wt 167.0 lb

## 2021-02-01 DIAGNOSIS — G8929 Other chronic pain: Secondary | ICD-10-CM

## 2021-02-01 DIAGNOSIS — I10 Essential (primary) hypertension: Secondary | ICD-10-CM

## 2021-02-01 DIAGNOSIS — R519 Headache, unspecified: Secondary | ICD-10-CM | POA: Diagnosis not present

## 2021-02-01 DIAGNOSIS — R7989 Other specified abnormal findings of blood chemistry: Secondary | ICD-10-CM

## 2021-02-01 DIAGNOSIS — N179 Acute kidney failure, unspecified: Secondary | ICD-10-CM

## 2021-02-01 DIAGNOSIS — M5020 Other cervical disc displacement, unspecified cervical region: Secondary | ICD-10-CM

## 2021-02-01 MED ORDER — AMLODIPINE BESYLATE 2.5 MG PO TABS: 2.5000 mg | ORAL_TABLET | Freq: Every day | ORAL | 1 refills | Status: DC
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
## 2021-02-02 ENCOUNTER — Other Ambulatory Visit: Payer: Self-pay | Admitting: *Deleted

## 2021-02-02 DIAGNOSIS — R7989 Other specified abnormal findings of blood chemistry: Secondary | ICD-10-CM

## 2021-02-02 LAB — BASIC METABOLIC PANEL
BUN/Creatinine Ratio: 21 — ABNORMAL HIGH (ref 9–20)
BUN: 26 mg/dL — ABNORMAL HIGH (ref 6–24)
CO2: 25 mmol/L (ref 20–29)
Calcium: 9.5 mg/dL (ref 8.7–10.2)
Chloride: 101 mmol/L (ref 96–106)
Creatinine, Ser: 1.22 mg/dL (ref 0.76–1.27)
Glucose: 101 mg/dL — ABNORMAL HIGH (ref 65–99)
Potassium: 4.3 mmol/L (ref 3.5–5.2)
Sodium: 141 mmol/L (ref 134–144)
eGFR: 70 mL/min/{1.73_m2} (ref >59)

## 2021-02-02 LAB — TSH+T4F+T3FREE
Free T4: 1.48 ng/dL (ref 0.82–1.77)
T3, Free: 3 pg/mL (ref 2.0–4.4)
TSH: 0.244 u[IU]/mL — ABNORMAL LOW (ref 0.450–4.500)

## 2021-02-08 ENCOUNTER — Other Ambulatory Visit: Payer: Self-pay | Admitting: Neurological Surgery

## 2021-02-08 DIAGNOSIS — S129XXA Fracture of neck, unspecified, initial encounter: Secondary | ICD-10-CM

## 2021-02-09 NOTE — Progress Notes (Signed)
Patient referred by Dr. Ladona Ridgel for cognitive complaints, seen by me on 01/17/21, diagnostic PSG on 01/30/21.   Please call and notify the patient that the recent sleep study did not show any significant obstructive sleep apnea with the exception of mild, intermittent snoring and supine sleep related sleep apnea. Treatment with positive airway pressure is not warranted. Some weight loss and avoidance of the supine sleep position may reduce his snoring and positional sleep apnea.  Please advise patient to keep his upcoming appointment with Dr. Roseanne Reno in neuropsychology.  Thanks,  [name], MD, PhD [facility])

## 2021-02-09 NOTE — Procedures (Signed)
PATIENT'S NAME:  Erik Ellis, Erik Ellis DOB:      1966-01-05      MR#:    [id number]     DATE OF RECORDING: 01/30/2021 REFERRING M.D.:  Dr. Laroy Apple Study Performed:   Baseline Polysomnogram HISTORY: 55 year old man with a history of asthma, allergies, reflux disease, mood disorder, recurrent headaches, degenerative cervical and lumbar spine disease with status post neck and back surgery, arthritis with status post knee replacement, and borderline overweight state, who reports a several month history of forgetfulness, word finding difficulty and difficulty spelling words. The patient's weight 170 pounds with a height of 69 (inches), resulting in a BMI of 25.1 kg/m2. The patient's neck circumference measured 16 inches.  CURRENT MEDICATIONS: Proventil, Ventolin, Adderall, Lioresal, Fasenra, Wellbutrin, Buspar, Vraylar, Voltaren, Epinephirine, Flonase, Trelegy ellipta, Neurontin, Lamictal, Lithium, Singulair, Protonix, Viagra, Ambien   PROCEDURE:  This is a multichannel digital polysomnogram utilizing the Somnostar 11.2 system.  Electrodes and sensors were applied and monitored per AASM Specifications.   EEG, EOG, Chin and Limb EMG, were sampled at 200 Hz.  ECG, Snore and Nasal Pressure, Thermal Airflow, Respiratory Effort, CPAP Flow and Pressure, Oximetry was sampled at 50 Hz. Digital video and audio were recorded.      BASELINE STUDY  Lights Out was at 21:45 and Lights On at 05:01.  Total recording time (TRT) was 436 minutes, with a total sleep time (TST) of 375.5 minutes.   The patient's sleep latency was 6 minutes.  REM latency was 259 minutes, which is markedly delayed. The sleep efficiency was 86.1%.     SLEEP ARCHITECTURE: WASO (Wake after sleep onset) was 54.5 minutes with mild sleep fragmentation noted. There were 24 minutes in Stage N1, 260 minutes Stage N2, 45.5 minutes Stage N3 and 46 minutes in Stage REM.  The percentage of Stage N1 was 6.4%, Stage N2 was 69.2%, which is increased, Stage N3  was 12.1% and Stage R (REM sleep) was 12.3%, which is reduced. The arousals were noted as: 35 were spontaneous, 0 were associated with PLMs, 15 were associated with respiratory events.  RESPIRATORY ANALYSIS:  There were a total of 25 respiratory events:  1 obstructive apneas, 0 central apneas and 0 mixed apneas with a total of 1 apneas and an apnea index (AI) of .2 /hour. There were 24 hypopneas with a hypopnea index of 3.8 /hour. The patient also had 0 respiratory event related arousals (RERAs).      The total APNEA/HYPOPNEA INDEX (AHI) was 4./hour and the total RESPIRATORY DISTURBANCE INDEX was  4. /hour.  1 events occurred in REM sleep and 46 events in NREM. The REM AHI was  1.3 /hour, versus a non-REM AHI of 4.4. The patient spent 24 minutes of total sleep time in the supine position and 352 minutes in non-supine.. The supine AHI was 42.5 versus a non-supine AHI of 1.4.  OXYGEN SATURATION & C02:  The Wake baseline 02 saturation was 96%, with the lowest being 88%. Time spent below 89% saturation equaled 0 minutes. PERIODIC LIMB MOVEMENTS: The patient had a total of 0 Periodic Limb Movements.  The Periodic Limb Movement (PLM) index was 0 and the PLM Arousal index was 0/hour.  Audio and video analysis did not show any abnormal or unusual movements, behaviors, phonations or vocalizations. The patient took 2 bathroom breaks. Mild intermittent snoring was noted. The EKG was in keeping with normal sinus rhythm (NSR).  Post-study, the patient indicated that sleep was the same as usual.   IMPRESSION:  1.  Primary Snoring 2. Dysfunctions associated with sleep stages or arousal from sleep  RECOMMENDATIONS:  1. This study does not demonstrate any significant obstructive or central sleep disordered breathing with the exception of mild, intermittent snoring and supine sleep related sleep apnea. Treatment with positive airway pressure is not warranted. Some weight loss and avoidance of the supine sleep  position may reduce his snoring and positional sleep apnea.  2. This study shows sleep fragmentation and abnormal sleep stage percentages; these are nonspecific findings and per se do not signify an intrinsic sleep disorder or a cause for the patient's sleep-related symptoms. Causes include (but are not limited to) the first night effect of the sleep study, circadian rhythm disturbances, medication effect or an underlying mood disorder or medical problem.  3. The patient should be cautioned not to drive, work at heights, or operate dangerous or heavy equipment when tired or sleepy. Review and reiteration of good sleep hygiene measures should be pursued with any patient. 4. The patient will be advised to follow up with the referring provider, who will be notified of the test results.  I certify that I have reviewed the entire raw data recording prior to the issuance of this report in accordance with the Standards of Accreditation of the American Academy of Sleep Medicine (AASM)   [name], MD, PhD Diplomat, American Board of Neurology and Sleep Medicine (Neurology and Sleep Medicine)

## 2021-02-11 ENCOUNTER — Encounter: Payer: Self-pay | Admitting: Neurology

## 2021-02-13 ENCOUNTER — Other Ambulatory Visit: Payer: BC Managed Care – PPO

## 2021-02-13 ENCOUNTER — Telehealth: Payer: Self-pay

## 2021-02-13 NOTE — Telephone Encounter (Signed)
-----   Message from [name], MD sent at 02/09/2021  1:49 PM EDT ----- Patient referred by Dr. Ladona Ridgel for cognitive complaints, seen by me on 01/17/21, diagnostic PSG on 01/30/21.   Please call and notify the patient that the recent sleep study did not show any significant obstructive sleep apnea with the exception of mild, intermittent snoring and supine sleep related sleep apnea. Treatment with positive airway pressure is not warranted. Some weight loss and avoidance of the supine sleep position may reduce his snoring and positional sleep apnea.  Please advise patient to keep his upcoming appointment with Dr. Roseanne Reno in neuropsychology.  Thanks,  [name], MD, PhD [facility])

## 2021-02-13 NOTE — Telephone Encounter (Signed)
Pt returned call. Please call back when available. 

## 2021-02-13 NOTE — Telephone Encounter (Signed)
I called the pt back and reviewed results. Pt verbalized understanding and was agreeable with keep f/u with Neuropsych as scheduled.

## 2021-02-13 NOTE — Telephone Encounter (Signed)
I called pt. No answer, left a message asking pt to call me back.   

## 2021-02-19 ENCOUNTER — Other Ambulatory Visit: Payer: BC Managed Care – PPO

## 2021-02-19 ENCOUNTER — Other Ambulatory Visit (HOSPITAL_COMMUNITY)
Admission: RE | Admit: 2021-02-19 | Discharge: 2021-02-19 | Disposition: A | Discharge status: Home / Self Care | Payer: BC Managed Care – PPO | Source: Ambulatory Visit | Attending: Neurological Surgery | Admitting: Neurological Surgery

## 2021-02-19 DIAGNOSIS — Z20822 Contact with and (suspected) exposure to covid-19: Secondary | ICD-10-CM | POA: Insufficient documentation

## 2021-02-19 DIAGNOSIS — Z01812 Encounter for preprocedural laboratory examination: Secondary | ICD-10-CM | POA: Insufficient documentation

## 2021-02-19 LAB — SARS CORONAVIRUS 2 (TAT 6-24 HRS): SARS Coronavirus 2: NEGATIVE

## 2021-02-20 ENCOUNTER — Encounter (HOSPITAL_COMMUNITY): Payer: Self-pay | Admitting: Neurological Surgery

## 2021-02-20 ENCOUNTER — Other Ambulatory Visit: Payer: Self-pay

## 2021-02-20 NOTE — Progress Notes (Signed)
PCP - Dr. Ladona Ridgel  Cardiologist - denies   Chest x-ray - DOS EKG - DOS Stress Test -  ECHO -  Cardiac Cath -    ERAS Protcol -   COVID TEST- 02/19/21 negative   Anesthesia review: n/a  -------------  SDW INSTRUCTIONS:  Your procedure is scheduled on 02/21/21. Please report to [facility] Main Entrance "A" at 0800 A.M., and check in at the Admitting office. Call this number if you have problems the morning of surgery: [phone]   Remember: Do not eat or drink after midnight the night before your surgery  Medications to take morning of surgery with a sip of water include: albuterol (PROVENTIL)  --- Please bring all inhalers with you the day of surgery.  buPROPion (WELLBUTRIN XL)  busPIRone (BUSPAR)  fluticasone (FLONASE) lamoTRIgine (LAMICTAL) pantoprazole (PROTONIX)    As of today, STOP taking any Aspirin (unless otherwise instructed by your surgeon), Aleve, Naproxen, Ibuprofen, Motrin, Advil, Goody's, BC's, all herbal medications, fish oil, and all vitamins.    The Morning of Surgery Do not wear jewelry Do not wear lotions, powders, colognes, or deodorant Men may shave face and neck. Do not bring valuables to the hospital. [facility] is not responsible for any belongings or valuables. If you are a smoker, DO NOT Smoke 24 hours prior to surgery If you wear a CPAP at night please bring your mask the morning of surgery  Remember that you must have someone to transport you home after your surgery, and remain with you for 24 hours if you are discharged the same day. Please bring cases for contacts, glasses, hearing aids, dentures or bridgework because it cannot be worn into surgery.   Patients discharged the day of surgery will not be allowed to drive home.   Please shower the NIGHT BEFORE SURGERY and the MORNING OF SURGERY with DIAL Soap. Wear comfortable clothes the morning of surgery. Oral Hygiene is also important to reduce your risk of infection.  Remember - BRUSH  YOUR TEETH THE MORNING OF SURGERY WITH YOUR REGULAR TOOTHPASTE  Patient denies shortness of breath, fever, cough and chest pain.

## 2021-02-21 ENCOUNTER — Inpatient Hospital Stay (HOSPITAL_COMMUNITY): Payer: BC Managed Care – PPO | Admitting: Anesthesiology

## 2021-02-21 ENCOUNTER — Inpatient Hospital Stay (HOSPITAL_COMMUNITY): Payer: BC Managed Care – PPO

## 2021-02-21 ENCOUNTER — Other Ambulatory Visit: Payer: Self-pay

## 2021-02-21 ENCOUNTER — Encounter (HOSPITAL_COMMUNITY): Payer: Self-pay | Admitting: Neurological Surgery

## 2021-02-21 ENCOUNTER — Inpatient Hospital Stay (HOSPITAL_COMMUNITY)
Admission: RE | Admit: 2021-02-21 | Discharge: 2021-02-22 | DRG: 473 | Disposition: A | Discharge status: Home / Self Care | Payer: BC Managed Care – PPO | Attending: Neurological Surgery | Admitting: Neurological Surgery

## 2021-02-21 ENCOUNTER — Encounter (HOSPITAL_COMMUNITY)
Admission: RE | Disposition: A | Discharge status: Home / Self Care | Payer: Self-pay | Source: Home / Self Care | Attending: Neurological Surgery

## 2021-02-21 DIAGNOSIS — Z7951 Long term (current) use of inhaled steroids: Secondary | ICD-10-CM | POA: Diagnosis not present

## 2021-02-21 DIAGNOSIS — I10 Essential (primary) hypertension: Secondary | ICD-10-CM | POA: Diagnosis present

## 2021-02-21 DIAGNOSIS — M50222 Other cervical disc displacement at C5-C6 level: Secondary | ICD-10-CM | POA: Diagnosis present

## 2021-02-21 DIAGNOSIS — S129XXA Fracture of neck, unspecified, initial encounter: Principal | ICD-10-CM

## 2021-02-21 DIAGNOSIS — Z79899 Other long term (current) drug therapy: Secondary | ICD-10-CM | POA: Diagnosis not present

## 2021-02-21 DIAGNOSIS — M4802 Spinal stenosis, cervical region: Secondary | ICD-10-CM | POA: Diagnosis present

## 2021-02-21 DIAGNOSIS — Z825 Family history of asthma and other chronic lower respiratory diseases: Secondary | ICD-10-CM | POA: Diagnosis not present

## 2021-02-21 DIAGNOSIS — M960 Pseudarthrosis after fusion or arthrodesis: Secondary | ICD-10-CM | POA: Diagnosis present

## 2021-02-21 DIAGNOSIS — Z20822 Contact with and (suspected) exposure to covid-19: Secondary | ICD-10-CM | POA: Diagnosis present

## 2021-02-21 DIAGNOSIS — M47812 Spondylosis without myelopathy or radiculopathy, cervical region: Secondary | ICD-10-CM | POA: Diagnosis present

## 2021-02-21 DIAGNOSIS — K219 Gastro-esophageal reflux disease without esophagitis: Secondary | ICD-10-CM | POA: Diagnosis present

## 2021-02-21 DIAGNOSIS — J45909 Unspecified asthma, uncomplicated: Secondary | ICD-10-CM | POA: Diagnosis present

## 2021-02-21 DIAGNOSIS — F319 Bipolar disorder, unspecified: Secondary | ICD-10-CM | POA: Diagnosis present

## 2021-02-21 DIAGNOSIS — Z981 Arthrodesis status: Secondary | ICD-10-CM

## 2021-02-21 DIAGNOSIS — Z419 Encounter for procedure for purposes other than remedying health state, unspecified: Secondary | ICD-10-CM

## 2021-02-21 HISTORY — PX: POSTERIOR CERVICAL FUSION/FORAMINOTOMY: SHX5038

## 2021-02-21 LAB — CBC WITH DIFFERENTIAL/PLATELET
Abs Immature Granulocytes: 0.02 10*3/uL (ref 0.00–0.07)
Basophils Absolute: 0 10*3/uL (ref 0.0–0.1)
Basophils Relative: 0 %
Eosinophils Absolute: 0 10*3/uL (ref 0.0–0.5)
Eosinophils Relative: 0 %
HCT: 39.9 % (ref 39.0–52.0)
Hemoglobin: 13.2 g/dL (ref 13.0–17.0)
Immature Granulocytes: 0 %
Lymphocytes Relative: 17 %
Lymphs Abs: 1 10*3/uL (ref 0.7–4.0)
MCH: 31.6 pg (ref 26.0–34.0)
MCHC: 33.1 g/dL (ref 30.0–36.0)
MCV: 95.5 fL (ref 80.0–100.0)
Monocytes Absolute: 0.4 10*3/uL (ref 0.1–1.0)
Monocytes Relative: 7 %
Neutro Abs: 4.5 10*3/uL (ref 1.7–7.7)
Neutrophils Relative %: 76 %
Platelets: 244 10*3/uL (ref 150–400)
RBC: 4.18 MIL/uL — ABNORMAL LOW (ref 4.22–5.81)
RDW: 12.2 % (ref 11.5–15.5)
WBC: 6 10*3/uL (ref 4.0–10.5)
nRBC: 0 % (ref 0.0–0.2)

## 2021-02-21 LAB — BASIC METABOLIC PANEL
Anion gap: 5 (ref 5–15)
BUN: 25 mg/dL — ABNORMAL HIGH (ref 6–20)
CO2: 32 mmol/L (ref 22–32)
Calcium: 9.6 mg/dL (ref 8.9–10.3)
Chloride: 102 mmol/L (ref 98–111)
Creatinine, Ser: 1.34 mg/dL — ABNORMAL HIGH (ref 0.61–1.24)
GFR, Estimated: >60 mL/min (ref >60)
Glucose, Bld: 99 mg/dL (ref 70–99)
Potassium: 4 mmol/L (ref 3.5–5.1)
Sodium: 139 mmol/L (ref 135–145)

## 2021-02-21 LAB — ABO/RH: ABO/RH(D): O POS

## 2021-02-21 LAB — PROTIME-INR
INR: 1 (ref 0.8–1.2)
Prothrombin Time: 13 seconds (ref 11.4–15.2)

## 2021-02-21 LAB — TYPE AND SCREEN
ABO/RH(D): O POS
Antibody Screen: NEGATIVE

## 2021-02-21 LAB — SURGICAL PCR SCREEN
MRSA, PCR: NEGATIVE
Staphylococcus aureus: NEGATIVE

## 2021-02-21 SURGERY — POSTERIOR CERVICAL FUSION/FORAMINOTOMY LEVEL 2
Anesthesia: General

## 2021-02-21 MED ORDER — ONDANSETRON HCL 4 MG PO TABS: 4.0000 mg | ORAL_TABLET | Freq: Four times a day (QID) | ORAL | Status: DC | PRN

## 2021-02-21 MED ORDER — CHLORHEXIDINE GLUCONATE CLOTH 2 % EX PADS: 6.0000 | MEDICATED_PAD | Freq: Once | CUTANEOUS | Status: DC

## 2021-02-21 MED ORDER — BUPIVACAINE HCL (PF) 0.25 % IJ SOLN
INTRAMUSCULAR | Status: DC | PRN
  Administered 2021-02-21: 6 mL

## 2021-02-21 MED ORDER — HYDROMORPHONE HCL 1 MG/ML IJ SOLN
INTRAMUSCULAR | Status: AC
  Administered 2021-02-21: 0.25 mg via INTRAVENOUS
  Filled 2021-02-21: qty 1

## 2021-02-21 MED ORDER — BUPIVACAINE HCL (PF) 0.25 % IJ SOLN
INTRAMUSCULAR | Status: AC
  Filled 2021-02-21: qty 30

## 2021-02-21 MED ORDER — MIDAZOLAM HCL 2 MG/2ML IJ SOLN
0.5000 mg | Freq: Once | INTRAMUSCULAR | Status: DC | PRN
Start: 2021-02-21 — End: 2021-02-21

## 2021-02-21 MED ORDER — PROMETHAZINE HCL 25 MG/ML IJ SOLN: 6.2500 mg | INTRAMUSCULAR | Status: DC | PRN

## 2021-02-21 MED ORDER — MIDAZOLAM HCL 2 MG/2ML IJ SOLN
INTRAMUSCULAR | Status: AC
  Filled 2021-02-21: qty 2

## 2021-02-21 MED ORDER — MORPHINE SULFATE (PF) 2 MG/ML IV SOLN
2.0000 mg | INTRAVENOUS | Status: DC | PRN
  Administered 2021-02-21 – 2021-02-22 (×2): 2 mg via INTRAVENOUS
  Filled 2021-02-21 (×2): qty 1

## 2021-02-21 MED ORDER — OXYCODONE HCL 5 MG PO TABS
ORAL_TABLET | ORAL | Status: AC
  Administered 2021-02-21: 5 mg via ORAL
  Filled 2021-02-21: qty 1

## 2021-02-21 MED ORDER — SENNA 8.6 MG PO TABS
1.0000 | ORAL_TABLET | Freq: Two times a day (BID) | ORAL | Status: DC
  Administered 2021-02-21 – 2021-02-22 (×2): 8.6 mg via ORAL
  Filled 2021-02-21 (×2): qty 1

## 2021-02-21 MED ORDER — POTASSIUM CHLORIDE IN NACL 20-0.9 MEQ/L-% IV SOLN: INTRAVENOUS | Status: DC

## 2021-02-21 MED ORDER — LIFITEGRAST 5 % OP SOLN: 1.0000 [drp] | Freq: Two times a day (BID) | OPHTHALMIC | Status: DC

## 2021-02-21 MED ORDER — SODIUM CHLORIDE 0.9% FLUSH: 3.0000 mL | INTRAVENOUS | Status: DC | PRN

## 2021-02-21 MED ORDER — ACETAMINOPHEN 650 MG RE SUPP: 650.0000 mg | RECTAL | Status: DC | PRN

## 2021-02-21 MED ORDER — ONDANSETRON HCL 4 MG/2ML IJ SOLN
INTRAMUSCULAR | Status: DC | PRN
  Administered 2021-02-21: 4 mg via INTRAVENOUS

## 2021-02-21 MED ORDER — BACITRACIN ZINC 500 UNIT/GM EX OINT
TOPICAL_OINTMENT | CUTANEOUS | Status: AC
  Filled 2021-02-21: qty 28.35

## 2021-02-21 MED ORDER — VANCOMYCIN HCL 1000 MG IV SOLR
INTRAVENOUS | Status: AC
  Filled 2021-02-21: qty 1000

## 2021-02-21 MED ORDER — SUGAMMADEX SODIUM 200 MG/2ML IV SOLN
INTRAVENOUS | Status: DC | PRN
  Administered 2021-02-21: 200 mg via INTRAVENOUS

## 2021-02-21 MED ORDER — PROPOFOL 10 MG/ML IV BOLUS
INTRAVENOUS | Status: AC
  Filled 2021-02-21: qty 20

## 2021-02-21 MED ORDER — OXYCODONE HCL 5 MG PO TABS
10.0000 mg | ORAL_TABLET | ORAL | Status: DC | PRN
  Administered 2021-02-21 – 2021-02-22 (×5): 10 mg via ORAL
  Filled 2021-02-21 (×5): qty 2

## 2021-02-21 MED ORDER — BUPROPION HCL ER (XL) 300 MG PO TB24
300.0000 mg | ORAL_TABLET | Freq: Every day | ORAL | Status: DC
  Administered 2021-02-22: 300 mg via ORAL
  Filled 2021-02-21: qty 1

## 2021-02-21 MED ORDER — MENTHOL 3 MG MT LOZG: 1.0000 | LOZENGE | OROMUCOSAL | Status: DC | PRN

## 2021-02-21 MED ORDER — THROMBIN 5000 UNITS EX SOLR: OROMUCOSAL | Status: DC | PRN

## 2021-02-21 MED ORDER — ACETAMINOPHEN 325 MG PO TABS
650.0000 mg | ORAL_TABLET | ORAL | Status: DC | PRN
  Administered 2021-02-21 – 2021-02-22 (×3): 650 mg via ORAL
  Filled 2021-02-21 (×3): qty 2

## 2021-02-21 MED ORDER — CHLORHEXIDINE GLUCONATE 0.12 % MT SOLN
15.0000 mL | Freq: Once | OROMUCOSAL | Status: AC
Start: 2021-02-21 — End: 2021-02-21
  Administered 2021-02-21: 15 mL via OROMUCOSAL

## 2021-02-21 MED ORDER — ACETAMINOPHEN 500 MG PO TABS
1000.0000 mg | ORAL_TABLET | ORAL | Status: AC
  Administered 2021-02-21: 1000 mg via ORAL
  Filled 2021-02-21: qty 2

## 2021-02-21 MED ORDER — AMLODIPINE BESYLATE 5 MG PO TABS
2.5000 mg | ORAL_TABLET | Freq: Every day | ORAL | Status: DC
  Administered 2021-02-21 – 2021-02-22 (×2): 2.5 mg via ORAL
  Filled 2021-02-21 (×2): qty 1

## 2021-02-21 MED ORDER — UMECLIDINIUM-VILANTEROL 62.5-25 MCG/INH IN AEPB
1.0000 | INHALATION_SPRAY | Freq: Every day | RESPIRATORY_TRACT | Status: DC
  Filled 2021-02-21: qty 14

## 2021-02-21 MED ORDER — GABAPENTIN 300 MG PO CAPS
300.0000 mg | ORAL_CAPSULE | ORAL | Status: AC
  Administered 2021-02-21: 300 mg via ORAL
  Filled 2021-02-21: qty 1

## 2021-02-21 MED ORDER — THROMBIN 5000 UNITS EX SOLR
CUTANEOUS | Status: DC | PRN
  Administered 2021-02-21 (×2): 5000 [IU] via TOPICAL

## 2021-02-21 MED ORDER — MIDAZOLAM HCL 2 MG/2ML IJ SOLN
INTRAMUSCULAR | Status: DC | PRN
  Administered 2021-02-21: 2 mg via INTRAVENOUS

## 2021-02-21 MED ORDER — ONDANSETRON HCL 4 MG/2ML IJ SOLN: 4.0000 mg | Freq: Four times a day (QID) | INTRAMUSCULAR | Status: DC | PRN

## 2021-02-21 MED ORDER — ALBUTEROL SULFATE HFA 108 (90 BASE) MCG/ACT IN AERS: 2.0000 | INHALATION_SPRAY | RESPIRATORY_TRACT | Status: DC | PRN

## 2021-02-21 MED ORDER — ROCURONIUM 10MG/ML (10ML) SYRINGE FOR MEDFUSION PUMP - OPTIME
INTRAVENOUS | Status: DC | PRN
  Administered 2021-02-21: 100 mg via INTRAVENOUS
  Administered 2021-02-21: 20 mg via INTRAVENOUS

## 2021-02-21 MED ORDER — THROMBIN 5000 UNITS EX SOLR
CUTANEOUS | Status: AC
  Filled 2021-02-21: qty 15000

## 2021-02-21 MED ORDER — MONTELUKAST SODIUM 10 MG PO TABS
10.0000 mg | ORAL_TABLET | Freq: Every day | ORAL | Status: DC
  Administered 2021-02-21: 10 mg via ORAL
  Filled 2021-02-21: qty 1

## 2021-02-21 MED ORDER — SUFENTANIL CITRATE 50 MCG/ML IV SOLN
INTRAVENOUS | Status: AC
  Filled 2021-02-21: qty 1

## 2021-02-21 MED ORDER — LACTATED RINGERS IV SOLN: INTRAVENOUS | Status: DC

## 2021-02-21 MED ORDER — SUFENTANIL CITRATE 50 MCG/ML IV SOLN
INTRAVENOUS | Status: DC | PRN
  Administered 2021-02-21: 10 ug via INTRAVENOUS
  Administered 2021-02-21: 5 ug via INTRAVENOUS

## 2021-02-21 MED ORDER — CHLORHEXIDINE GLUCONATE 0.12 % MT SOLN
OROMUCOSAL | Status: AC
  Filled 2021-02-21: qty 15

## 2021-02-21 MED ORDER — BUSPIRONE HCL 15 MG PO TABS
30.0000 mg | ORAL_TABLET | Freq: Two times a day (BID) | ORAL | Status: DC
  Administered 2021-02-21 – 2021-02-22 (×2): 30 mg via ORAL
  Filled 2021-02-21 (×2): qty 2

## 2021-02-21 MED ORDER — HYDROMORPHONE HCL 1 MG/ML IJ SOLN
0.2500 mg | INTRAMUSCULAR | Status: DC | PRN
  Administered 2021-02-21 (×2): 0.25 mg via INTRAVENOUS

## 2021-02-21 MED ORDER — PHENYLEPHRINE HCL-NACL 10-0.9 MG/250ML-% IV SOLN
INTRAVENOUS | Status: DC | PRN
  Administered 2021-02-21: 50 ug/min via INTRAVENOUS

## 2021-02-21 MED ORDER — CEFAZOLIN SODIUM-DEXTROSE 2-4 GM/100ML-% IV SOLN
2.0000 g | INTRAVENOUS | Status: AC
  Administered 2021-02-21: 2 g via INTRAVENOUS
  Filled 2021-02-21: qty 100

## 2021-02-21 MED ORDER — OXYCODONE HCL 5 MG/5ML PO SOLN: 5.0000 mg | Freq: Once | ORAL | Status: AC | PRN

## 2021-02-21 MED ORDER — GABAPENTIN 300 MG PO CAPS
300.0000 mg | ORAL_CAPSULE | Freq: Every day | ORAL | Status: DC
  Administered 2021-02-21: 300 mg via ORAL
  Filled 2021-02-21: qty 1

## 2021-02-21 MED ORDER — FLUTICASONE-UMECLIDIN-VILANT 200-62.5-25 MCG/INH IN AEPB: 1.0000 | INHALATION_SPRAY | Freq: Every day | RESPIRATORY_TRACT | Status: DC

## 2021-02-21 MED ORDER — PROPOFOL 10 MG/ML IV BOLUS
INTRAVENOUS | Status: DC | PRN
  Administered 2021-02-21: 200 mg via INTRAVENOUS

## 2021-02-21 MED ORDER — HYDROXYZINE HCL 50 MG/ML IM SOLN: 50.0000 mg | Freq: Four times a day (QID) | INTRAMUSCULAR | Status: DC | PRN

## 2021-02-21 MED ORDER — HEMOSTATIC AGENTS (NO CHARGE) OPTIME
TOPICAL | Status: DC | PRN
  Administered 2021-02-21: 1 via TOPICAL

## 2021-02-21 MED ORDER — SODIUM CHLORIDE 0.9 % IV SOLN: 250.0000 mL | INTRAVENOUS | Status: DC

## 2021-02-21 MED ORDER — METHOCARBAMOL 1000 MG/10ML IJ SOLN
500.0000 mg | Freq: Four times a day (QID) | INTRAVENOUS | Status: DC | PRN
  Filled 2021-02-21: qty 5

## 2021-02-21 MED ORDER — ORAL CARE MOUTH RINSE: 15.0000 mL | Freq: Once | OROMUCOSAL | Status: AC

## 2021-02-21 MED ORDER — OXYCODONE HCL 5 MG PO TABS: 5.0000 mg | ORAL_TABLET | Freq: Once | ORAL | Status: AC | PRN

## 2021-02-21 MED ORDER — PHENOL 1.4 % MT LIQD: 1.0000 | OROMUCOSAL | Status: DC | PRN

## 2021-02-21 MED ORDER — 0.9 % SODIUM CHLORIDE (POUR BTL) OPTIME
TOPICAL | Status: DC | PRN
  Administered 2021-02-21: 1000 mL

## 2021-02-21 MED ORDER — LIDOCAINE HCL (CARDIAC) PF 100 MG/5ML IV SOSY
PREFILLED_SYRINGE | INTRAVENOUS | Status: DC | PRN
  Administered 2021-02-21: 20 mg via INTRAVENOUS

## 2021-02-21 MED ORDER — LACTATED RINGERS IV SOLN: INTRAVENOUS | Status: DC | PRN

## 2021-02-21 MED ORDER — SODIUM CHLORIDE 0.9% FLUSH: 3.0000 mL | Freq: Two times a day (BID) | INTRAVENOUS | Status: DC

## 2021-02-21 MED ORDER — DEXAMETHASONE SODIUM PHOSPHATE 10 MG/ML IJ SOLN
10.0000 mg | Freq: Once | INTRAMUSCULAR | Status: AC
  Administered 2021-02-21: 10 mg via INTRAVENOUS
  Filled 2021-02-21: qty 1

## 2021-02-21 MED ORDER — METHOCARBAMOL 500 MG PO TABS
500.0000 mg | ORAL_TABLET | Freq: Four times a day (QID) | ORAL | Status: DC | PRN
  Administered 2021-02-21 – 2021-02-22 (×3): 500 mg via ORAL
  Filled 2021-02-21 (×3): qty 1

## 2021-02-21 MED ORDER — LAMOTRIGINE 100 MG PO TABS
200.0000 mg | ORAL_TABLET | Freq: Two times a day (BID) | ORAL | Status: DC
  Administered 2021-02-21 – 2021-02-22 (×2): 200 mg via ORAL
  Filled 2021-02-21 (×2): qty 2

## 2021-02-21 MED ORDER — MEPERIDINE HCL 25 MG/ML IJ SOLN: 6.2500 mg | INTRAMUSCULAR | Status: DC | PRN

## 2021-02-21 MED ORDER — CEFAZOLIN SODIUM-DEXTROSE 2-4 GM/100ML-% IV SOLN
2.0000 g | Freq: Three times a day (TID) | INTRAVENOUS | Status: AC
  Administered 2021-02-21 – 2021-02-22 (×2): 2 g via INTRAVENOUS
  Filled 2021-02-21 (×2): qty 100

## 2021-02-21 MED ORDER — PANTOPRAZOLE SODIUM 40 MG PO TBEC
40.0000 mg | DELAYED_RELEASE_TABLET | Freq: Every day | ORAL | Status: DC
  Administered 2021-02-22: 40 mg via ORAL
  Filled 2021-02-21: qty 1

## 2021-02-21 MED ORDER — CARIPRAZINE HCL 1.5 MG PO CAPS
1.5000 mg | ORAL_CAPSULE | Freq: Every day | ORAL | Status: DC
  Administered 2021-02-21 – 2021-02-22 (×2): 1.5 mg via ORAL
  Filled 2021-02-21 (×2): qty 1

## 2021-02-21 MED ORDER — AMPHETAMINE-DEXTROAMPHETAMINE 10 MG PO TABS: 10.0000 mg | ORAL_TABLET | Freq: Every day | ORAL | Status: DC

## 2021-02-21 SURGICAL SUPPLY — 52 items
BENZOIN TINCTURE PRP APPL 2/3 (GAUZE/BANDAGES/DRESSINGS) ×2 IMPLANT
BLADE CLIPPER SURG (BLADE) IMPLANT
BUR CARBIDE MATCH 3.0 (BURR) ×2 IMPLANT
CANISTER SUCT 3000ML PPV (MISCELLANEOUS) ×2 IMPLANT
COVER WAND RF STERILE (DRAPES) ×2 IMPLANT
DIFFUSER DRILL AIR PNEUMATIC (MISCELLANEOUS) IMPLANT
DRAPE C-ARM 42X72 X-RAY (DRAPES) ×4 IMPLANT
DRAPE LAPAROTOMY 100X72 PEDS (DRAPES) ×2 IMPLANT
DRSG OPSITE POSTOP 4X6 (GAUZE/BANDAGES/DRESSINGS) ×2 IMPLANT
DURAPREP 6ML APPLICATOR 50/CS (WOUND CARE) ×2 IMPLANT
ELECT REM PT RETURN 9FT ADLT (ELECTROSURGICAL) ×2
ELECTRODE REM PT RTRN 9FT ADLT (ELECTROSURGICAL) ×1 IMPLANT
EVACUATOR 1/8 PVC DRAIN (DRAIN) IMPLANT
GAUZE 4X4 16PLY RFD (DISPOSABLE) IMPLANT
GAUZE SPONGE 4X4 12PLY STRL (GAUZE/BANDAGES/DRESSINGS) ×2 IMPLANT
GLOVE BIO SURGEON STRL SZ7 (GLOVE) IMPLANT
GLOVE BIO SURGEON STRL SZ8 (GLOVE) ×2 IMPLANT
GLOVE EXAM NITRILE XL STR (GLOVE) IMPLANT
GLOVE SURG POLYISO LF SZ7 (GLOVE) ×4 IMPLANT
GLOVE SURG POLYISO LF SZ7.5 (GLOVE) ×4 IMPLANT
GLOVE SURG UNDER POLY LF SZ7 (GLOVE) IMPLANT
GOWN STRL REUS W/ TWL LRG LVL3 (GOWN DISPOSABLE) ×2 IMPLANT
GOWN STRL REUS W/ TWL XL LVL3 (GOWN DISPOSABLE) ×1 IMPLANT
GOWN STRL REUS W/TWL 2XL LVL3 (GOWN DISPOSABLE) IMPLANT
GOWN STRL REUS W/TWL LRG LVL3 (GOWN DISPOSABLE) ×2
GOWN STRL REUS W/TWL XL LVL3 (GOWN DISPOSABLE) ×1
GRAFT TRIN ELITE MED MUSC TRAN (Graft) ×2 IMPLANT
HEMOSTAT POWDER KIT SURGIFOAM (HEMOSTASIS) ×2 IMPLANT
KIT BASIN OR (CUSTOM PROCEDURE TRAY) ×2 IMPLANT
KIT TURNOVER KIT B (KITS) ×2 IMPLANT
MARKER SKIN DUAL TIP RULER LAB (MISCELLANEOUS) ×2 IMPLANT
NEEDLE HYPO 18GX1.5 BLUNT FILL (NEEDLE) IMPLANT
NEEDLE HYPO 25X1 1.5 SAFETY (NEEDLE) ×2 IMPLANT
NEEDLE SPNL 20GX3.5 QUINCKE YW (NEEDLE) ×2 IMPLANT
NS IRRIG 1000ML POUR BTL (IV SOLUTION) ×2 IMPLANT
PACK LAMINECTOMY NEURO (CUSTOM PROCEDURE TRAY) ×2 IMPLANT
PIN MAYFIELD SKULL DISP (PIN) ×2 IMPLANT
ROD LORD TI 3.5X40 (Rod) ×4 IMPLANT
SCREW POLYAXIAL 3.5 X 14 (Screw) ×12 IMPLANT
SCREW SET ATEC (Screw) ×12 IMPLANT
SPONGE SURGIFOAM ABS GEL SZ50 (HEMOSTASIS) ×2 IMPLANT
STRIP CLOSURE SKIN 1/2X4 (GAUZE/BANDAGES/DRESSINGS) ×2 IMPLANT
SUT VIC AB 0 CT1 18XCR BRD8 (SUTURE) ×1 IMPLANT
SUT VIC AB 0 CT1 8-18 (SUTURE) ×1
SUT VIC AB 2-0 CP2 18 (SUTURE) ×2 IMPLANT
SUT VIC AB 3-0 SH 8-18 (SUTURE) ×2 IMPLANT
SYR 3ML LL SCALE MARK (SYRINGE) IMPLANT
TOWEL GREEN STERILE (TOWEL DISPOSABLE) ×2 IMPLANT
TOWEL GREEN STERILE FF (TOWEL DISPOSABLE) ×2 IMPLANT
TRAY FOLEY MTR SLVR 16FR STAT (SET/KITS/TRAYS/PACK) IMPLANT
UNDERPAD 30X36 HEAVY ABSORB (UNDERPADS AND DIAPERS) ×2 IMPLANT
WATER STERILE IRR 1000ML POUR (IV SOLUTION) ×2 IMPLANT

## 2021-02-21 NOTE — H&P (Signed)
Subjective:   Patient is a 55 y.o. Ellis admitted for neck pain. The patient first presented to me with complaints of neck pain. Onset of symptoms was several months ago. The pain is described as aching and occurs all day. The pain is rated severe, and is located in the neck and radiates to the shoulders. The symptoms have been progressive. Symptoms are exacerbated by extending head backwards, and are relieved by none.  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  Past Medical History:  Diagnosis Date  . Asthma     Past Surgical History:  Procedure Laterality Date  . CHOLECYSTECTOMY    . KNEE ARTHROSCOPY    . LUMBAR DISC SURGERY    . NECK SURGERY    . REPLACEMENT TOTAL KNEE      No Known Allergies  Social History   Tobacco Use  . Smoking status: Never Smoker  . Smokeless tobacco: Never Used  Substance Use Topics  . Alcohol use: Not Currently    Family History  Problem Relation Age of Onset  . Asthma Father   . Allergic rhinitis Neg Hx   . Angioedema Neg Hx   . Atopy Neg Hx   . Eczema Neg Hx   . Immunodeficiency Neg Hx   . Urticaria Neg Hx    Prior to Admission medications   Medication Sig Start Date End Date Taking? Authorizing Provider  amLODipine (NORVASC) 2.5 MG tablet Take 1 tablet (2.5 mg total) by mouth daily. 02/01/21  Yes Ladona Ridgel, Malena M, DO  amphetamine-dextroamphetamine (ADDERALL) 10 MG tablet Take 10 mg by mouth daily with breakfast. 07/24/16  Yes [provider]  Ascorbic Acid (VITAMIN C) 1000 MG tablet Take 1,000 mg by mouth daily.   Yes [provider]  Benralizumab (FASENRA PEN) 30 MG/ML SOAJ INJECT 1 PEN UNDER THE SKIN  EVERY 8 WEEKS Patient taking differently: Inject 30 mg into the skin every 8 (eight) weeks. INJECT 1 PEN UNDER THE SKIN  EVERY 8 WEEKS 11/21/20  Yes Alfonse Spruce, MD   buPROPion (WELLBUTRIN XL) 300 MG 24 hr tablet Take 300 mg by mouth daily. 07/27/14  Yes [provider]  busPIRone (BUSPAR) 15 MG tablet Take 30 mg by mouth 2 (two) times daily. 07/24/14  Yes [provider]  cariprazine (VRAYLAR) capsule Take 1.5 mg by mouth daily.   Yes [provider]  Fluticasone-Umeclidin-Vilant (TRELEGY ELLIPTA) 200-62.5-25 MCG/INH AEPB INHALE 1 PUFF ONCE DAILY Patient taking differently: Inhale 1 puff into the lungs daily. INHALE 1 PUFF ONCE DAILY 01/22/21  Yes Alfonse Spruce, MD  gabapentin (NEURONTIN) 300 MG capsule Take 300 mg by mouth at bedtime. 03/24/16  Yes [provider]  lamoTRIgine (LAMICTAL) 200 MG tablet Take 200 mg by mouth 2 (two) times daily. 08/14/14  Yes [provider]  magnesium oxide (MAG-OX) 400 MG tablet Take 400 mg by mouth daily.   Yes [provider]  montelukast (SINGULAIR) 10 MG tablet TAKE 1 TABLET BY MOUTH EVERYDAY AT BEDTIME Patient taking differently: Take 10 mg by mouth at bedtime. 01/12/21  Yes Alfonse Spruce, MD  Multiple Vitamins-Minerals (MULTIVITAMIN WITH MINERALS) tablet Take 1 tablet by mouth daily.   Yes [provider]  Omega-3 Fatty Acids (FISH OIL) 1000 MG CAPS Take 2,000 mg by mouth daily.   Yes [provider]  pantoprazole (PROTONIX) 40 MG  tablet TAKE 1 TABLET BY MOUTH EVERY DAY Patient taking differently: Take 40 mg by mouth daily. 01/12/21  Yes Ladona Ridgel, Malena M, DO  sildenafil (VIAGRA) 50 MG tablet Take 50 mg by mouth as needed for erectile dysfunction. 09/16/18  Yes [provider]  XIIDRA 5 % SOLN Place 1 drop into both eyes in the morning and at bedtime. 08/13/18  Yes [provider]  zolpidem (AMBIEN) 10 MG tablet Take 5-10 mg by mouth at bedtime.  07/26/14  Yes [provider]  albuterol (PROVENTIL) (2.5 MG/3ML) 0.083% nebulizer solution Take 3 mLs (2.5 mg total) by nebulization every 4 (four) hours as needed for  wheezing or shortness of breath. Patient not taking: No sig reported 07/25/20   Alfonse Spruce, MD  albuterol (VENTOLIN HFA) 108 (90 Base) MCG/ACT inhaler Inhale 2 puffs into the lungs every 4 (four) hours as needed. For shortness of breath/wheezing 08/04/19   Merlyn Albert, MD  fluticasone [facility]) 50 MCG/ACT nasal spray SPRAY 2 SPRAYS INTO EACH NOSTRIL EVERY DAY Patient taking differently: Place 2 sprays into both nostrils daily. 07/14/20   Alfonse Spruce, MD     Review of Systems  Positive ROS: neg  All other systems have been reviewed and were otherwise negative with the exception of those mentioned in the HPI and as above.  Objective: Vital signs in last 24 hours: Temp:  [98.6 F (37 C)] 98.6 F (37 C) (04/20 0816) Pulse Rate:  [70] 70 (04/20 0816) Resp:  [18] 18 (04/20 0816) BP: (149)/(83) 149/83 (04/20 0816) SpO2:  [98 %] 98 % (04/20 0816) Weight:  [77.1 kg] 77.1 kg (04/20 1000)  General Appearance: Alert, cooperative, no distress, appears stated age Head: Normocephalic, without obvious abnormality, atraumatic Eyes: PERRL, conjunctiva/corneas clear, EOM's intact      Neck: Supple, symmetrical, trachea midline, Back: Symmetric, no curvature, ROM normal, no CVA tenderness Lungs:  respirations unlabored Heart: Regular rate and rhythm Abdomen: Soft, non-tender Extremities: Extremities normal, atraumatic, no cyanosis or edema Pulses: 2+ and symmetric all extremities Skin: Skin color, texture, turgor normal, no rashes or lesions  NEUROLOGIC:  Mental status: Alert and oriented x4, no aphasia, good attention span, fund of knowledge and memory  Motor Exam - grossly normal Sensory Exam - grossly normal Reflexes: 1+ Coordination - grossly normal Gait - grossly normal Balance - grossly normal Cranial Nerves: I: smell Not tested  II: visual acuity  OS: nl    OD: nl  II: visual fields Full to confrontation  II: pupils Equal, round, reactive to light   III,VII: ptosis None  III,IV,VI: extraocular muscles  Full ROM  V: mastication Normal  V: facial light touch sensation  Normal  V,VII: corneal reflex  Present  VII: facial muscle function - upper  Normal  VII: facial muscle function - lower Normal  VIII: hearing Not tested  IX: soft palate elevation  Normal  IX,X: gag reflex Present  XI: trapezius strength  5/5  XI: sternocleidomastoid strength 5/5  XI: neck flexion strength  5/5  XII: tongue strength  Normal    Data Review Lab Results  Component Value Date   WBC 6.0 02/21/2021   HGB 13.2 02/21/2021   HCT 39.9 02/21/2021   MCV 95.5 02/21/2021   PLT 244 02/21/2021   Lab Results  Component Value Date   NA 139 02/21/2021   K 4.0 02/21/2021   CL 102 02/21/2021   CO2 32 02/21/2021   BUN 25 (H) 02/21/2021   CREATININE 1.Erik (H)  02/21/2021   GLUCOSE 99 02/21/2021   Lab Results  Component Value Date   INR 1.0 02/21/2021    Assessment:   Cervical neck pain with herniated nucleus pulposus/ 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 Estimated body mass index is 25.1 kg/m as calculated from the following:   Height as of this encounter: 5\' 9"  (1.753 m).   Weight as of this encounter: 77.1 kg.  Patient has failed conservative therapy. Planned surgery : PCF C5-7  Plan:   I explained the condition and procedure to the patient and answered any questions.  Patient wishes to proceed with procedure as planned. Understands risks/ benefits/ and expected or typical outcomes.  02/21/2021 10:31 AM +

## 2021-02-21 NOTE — Op Note (Signed)
02/21/2021  12:54 PM  PATIENT:  Erik Ellis  55 y.o. 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  BLOOD ADMINISTERED: none  DRAINS: none  SPECIMEN:  none  INDICATION FOR PROCEDURE: This patient presented with neck pain. 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 The patient tried conservative measures without relief. Pain was debilitating. Recommended posterior cervical arthrodesis. Patient understood the risks, benefits, and alternatives and potential outcomes and wished to proceed.  PROCEDURE DETAILS: The patient was brought to the operating room. Generalized endotracheal anesthesia was induced. The patient was affixed a 3 point [place] headrest and rolled into the prone position on chest rolls. All pressure points were padded. The posterior cervical region was cleaned and prepped with DuraPrep and then draped in the usual sterile fashion. 7 cc of local anesthesia was injected and a dorsal midline incision made in the posterior cervical region and carried down to the cervical fascia. The fascia was opened and the paraspinous musculature was taken down to expose C5-6 and C6-7. Intraoperative fluoroscopy confirmed my level and then the dissection was carried out over the lateral facets.  We pulled on the spinous process at C6-7 and also mobilize the facet joints and saw mobility suggesting pseudoarthrosis at C6-7.  I localized the midpoint of each lateral mass and marked a region 1 mm medial to the midpoint of the lateral mass, and then drilled in an upward  and outward direction into the safe zone of each lateral mass. I drilled to a depth of 14 mm and then checked my drill hole with a ball probe. I then placed a 14 mm lateral mass screws into the safe zone of each lateral mass of C5, C6 and C7 until they were 2 fingers tight.  I then decorticated the lateral masses and the facet joints and packed them with morcellized allograft to perform arthrodesis from C5-C7 bilaterally. I then placed rods into the multiaxial screw heads of the screws and locked these into position with the locking caps and anti-torque device. I then checked the final construct with AP/Lat fluoroscopy. I irrigated with saline solution.  After hemostasis was achieved I closed the muscle and the fascia with 0 Vicryl, subcutaneous tissue with 2-0 Vicryl, and the subcuticular tissue with 3-0 Vicryl. The skin was closed with benzoin and Steri-Strips. A sterile dressing was applied, the patient was turned to the supine position and taken out of the headrest, awakened from general anesthesia and transferred to the recovery room in stable condition. At the end of the procedure all sponge, needle and instrument counts were correct.    PLAN OF CARE: Admit for overnight observation  PATIENT DISPOSITION:  PACU - hemodynamically stable.   Delay start of Pharmacological VTE agent (>24hrs) due to surgical blood loss or risk of bleeding:  yes

## 2021-02-21 NOTE — Transfer of Care (Signed)
Immediate Anesthesia Transfer of Care Note  Patient: Erik Ellis  Procedure(s) Performed: Posterior cervical fusion with lateral mass fixation - Cervical Ficve-Six/Cervical Six-Seven (N/A )  Patient Location: PACU  Anesthesia Type:General  Level of Consciousness: drowsy, patient cooperative and responds to stimulation  Airway & Oxygen Therapy: Patient Spontanous Breathing and Patient connected to nasal cannula oxygen  Post-op Assessment: Report given to RN, Post -op Vital signs reviewed and stable and Patient moving all extremities X 4  Post vital signs: Reviewed and stable  Last Vitals:  Vitals Value Taken Time  BP 154/86 02/21/21 1310  Temp 36.5 C 02/21/21 1310  Pulse 89 02/21/21 1314  Resp 19 02/21/21 1314  SpO2 98 % 02/21/21 1314  Vitals shown include unvalidated device data.  Last Pain:  Vitals:   02/21/21 0927  TempSrc:   PainSc: 5       Patients Stated Pain Goal: 5 (02/21/21 0927)  Complications: No complications documented.

## 2021-02-21 NOTE — Anesthesia Preprocedure Evaluation (Addendum)
Anesthesia Evaluation  Patient identified by MRN, date of birth, ID band Patient awake    Reviewed: Allergy & Precautions, NPO status , Patient's Chart, lab work & pertinent test results  History of Anesthesia Complications Negative for: history of anesthetic complications  Airway Mallampati: II  TM Distance: >3 FB Neck ROM: Full    Dental  (+) Dental Advisory Given   Pulmonary asthma , COPD,  COPD inhaler,  02/19/2021 SARS coronavirus NEG   breath sounds clear to auscultation       Cardiovascular hypertension, Pt. on medications (-) angina Rhythm:Regular Rate:Normal     Neuro/Psych  Headaches, Bipolar Disorder    GI/Hepatic Neg liver ROS, GERD  Medicated and Controlled,  Endo/Other  negative endocrine ROS  Renal/GU Renal InsufficiencyRenal disease     Musculoskeletal   Abdominal   Peds  Hematology negative hematology ROS (+)   Anesthesia Other Findings   Reproductive/Obstetrics                            Anesthesia Physical Anesthesia Plan  ASA: II  Anesthesia Plan: General   Post-op Pain Management:    Induction: Intravenous  PONV Risk Score and Plan: 2 and Ondansetron and Dexamethasone  Airway Management Planned: Oral ETT and Video Laryngoscope Planned  Additional Equipment: None  Intra-op Plan:   Post-operative Plan: Extubation in OR  Informed Consent: I have reviewed the patients History and Physical, chart, labs and discussed the procedure including the risks, benefits and alternatives for the proposed anesthesia with the patient or authorized representative who has indicated his/her understanding and acceptance.     Dental advisory given  Plan Discussed with: CRNA and Surgeon  Anesthesia Plan Comments:        Anesthesia Quick Evaluation

## 2021-02-21 NOTE — Anesthesia Procedure Notes (Signed)
Procedure Name: Intubation Date/Time: 02/21/2021 11:24 AM Performed by: Melina Schools, CRNA Pre-anesthesia Checklist: Patient identified, Emergency Drugs available, Suction available, Patient being monitored and Timeout performed Patient Re-evaluated:Patient Re-evaluated prior to induction Oxygen Delivery Method: Circle system utilized Preoxygenation: Pre-oxygenation with 100% oxygen Induction Type: IV induction and Cricoid Pressure applied Ventilation: Mask ventilation without difficulty Laryngoscope Size: Glidescope Grade View: Grade II Tube type: Oral Tube size: 7.5 mm Number of attempts: 1 Airway Equipment and Method: Video-laryngoscopy and Stylet Placement Confirmation: ETT inserted through vocal cords under direct vision,  positive ETCO2 and breath sounds checked- equal and bilateral Secured at: 23 cm Tube secured with: Tape Dental Injury: Teeth and Oropharynx as per pre-operative assessment

## 2021-02-21 NOTE — Anesthesia Postprocedure Evaluation (Signed)
Anesthesia Post Note  Patient: Erik Ellis  Procedure(s) Performed: Posterior cervical fusion with lateral mass fixation - Cervical Ficve-Six/Cervical Six-Seven (N/A )     Patient location during evaluation: PACU Anesthesia Type: General Level of consciousness: patient cooperative, oriented and sedated Pain management: pain level controlled Vital Signs Assessment: post-procedure vital signs reviewed and stable Respiratory status: spontaneous breathing, nonlabored ventilation and respiratory function stable Cardiovascular status: blood pressure returned to baseline and stable Postop Assessment: no apparent nausea or vomiting Anesthetic complications: no   No complications documented.  Last Vitals:  Vitals:   02/21/21 1340 02/21/21 1410  BP: (!) 148/93 (!) 149/91  Pulse: 83 83  Resp: 14 17  Temp: 36.7 C 36.4 C  SpO2: 95% 95%    Last Pain:  Vitals:   02/21/21 1415  TempSrc:   PainSc: Asleep                 Nekisha Mcdiarmid,E. Karen Huhta

## 2021-02-22 ENCOUNTER — Encounter (HOSPITAL_COMMUNITY): Payer: Self-pay | Admitting: Neurological Surgery

## 2021-02-22 MED ORDER — METHOCARBAMOL 500 MG PO TABS: 500.0000 mg | ORAL_TABLET | Freq: Four times a day (QID) | ORAL | 0 refills | Status: DC | PRN

## 2021-02-22 MED ORDER — OXYCODONE HCL 5 MG PO TABS: 5.0000 mg | ORAL_TABLET | ORAL | 0 refills | Status: DC | PRN

## 2021-02-22 NOTE — Discharge Summary (Signed)
Physician Discharge Summary  Patient ID: Erik Ellis MRN: [id number] DOB/AGE: 1965-11-27 55 y.o.  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 The patient tolerated the procedure well and was taken to the recovery room and then to the floor in stable condition. The hospital course was routine. There were no complications. The wound remained clean dry and intact. Pt had appropriate neck soreness. No complaints of arm pain or new N/T/W. The patient remained afebrile with stable vital signs, and tolerated a regular diet. The patient continued to increase activities, and pain was well controlled with oral pain medications.   Consults: None  Significant Diagnostic Studies:  Results for orders placed or performed during the hospital encounter of 02/21/21  Surgical pcr screen   Specimen: Nasal Mucosa; Nasal Swab  Result Value Ref Range   MRSA, PCR NEGATIVE NEGATIVE   Staphylococcus aureus NEGATIVE NEGATIVE  Basic metabolic panel  Result Value Ref Range   Sodium 139 135 - 145 mmol/L   Potassium 4.0 3.5 - 5.1 mmol/L   Chloride 102 98 - 111 mmol/L   CO2 32 22 - 32 mmol/L   Glucose, Bld 99 70 - 99 mg/dL   BUN 25 (H) 6 - 20 mg/dL   Creatinine, Ser 4.09 (H) 0.61 - 1.24 mg/dL   Calcium 9.6 8.9 - 81.1 mg/dL   GFR, Estimated >91 >47 mL/min   Anion gap 5 5 - 15  CBC WITH DIFFERENTIAL  Result Value Ref Range   WBC 6.0 4.0 - 10.5 K/uL   RBC 4.18 (L) 4.22 - 5.81 MIL/uL   Hemoglobin 13.2 13.0 - 17.0 g/dL   HCT 82.9 56.2 - 13.0 %   MCV 95.5 80.0 - 100.0 fL   MCH 31.6 26.0 - 34.0 pg   MCHC 33.1 30.0 - 36.0 g/dL   RDW 86.5 78.4 - 69.6 %   Platelets 244 150 - 400 K/uL   nRBC 0.0 0.0 - 0.2 %   Neutrophils  Relative % 76 %   Neutro Abs 4.5 1.7 - 7.7 K/uL   Lymphocytes Relative 17 %   Lymphs Abs 1.0 0.7 - 4.0 K/uL   Monocytes Relative 7 %   Monocytes Absolute 0.4 0.1 - 1.0 K/uL   Eosinophils Relative 0 %   Eosinophils Absolute 0.0 0.0 - 0.5 K/uL   Basophils Relative 0 %   Basophils Absolute 0.0 0.0 - 0.1 K/uL   Immature Granulocytes 0 %   Abs Immature Granulocytes 0.02 0.00 - 0.07 K/uL  Protime-INR  Result Value Ref Range   Prothrombin Time 13.0 11.4 - 15.2 seconds   INR 1.0 0.8 - 1.2  Type and screen [facility]  Result Value Ref Range   ABO/RH(D) O POS    Antibody Screen NEG    Sample Expiration      02/24/2021,2359 Performed at [facility], [address], [place], [place] [address]   ABO/Rh  Result Value Ref Range   ABO/RH(D)      O POS Performed at [facility], [address] [place], [place] [address]     Nocturnal polysomnography  Result Date: 01/30/2021 [name], MD     02/09/2021  1:41 PM PATIENT'S NAME:  Erik Ellis DOB:  [birth year]     MR#:    [id number]    DATE OF RECORDING: 01/30/2021 REFERRING M.D.:  Dr. Laroy Apple Study Performed:   Baseline Polysomnogram HISTORY: 55 year old man with a history of asthma, allergies, reflux disease, mood disorder, recurrent headaches, degenerative cervical and lumbar spine disease with status post neck and back surgery, arthritis with status post knee replacement, and borderline overweight state, who reports a several month history of forgetfulness, word finding difficulty and difficulty spelling words. The patient's weight 170 pounds with a height of 69 (inches), resulting in a BMI of 25.1 kg/m2. The patient's neck circumference measured 16 inches. CURRENT MEDICATIONS: Proventil, Ventolin, Adderall, Lioresal, Fasenra, Wellbutrin, Buspar, Vraylar, Voltaren, Epinephirine, Flonase, Trelegy ellipta, Neurontin, Lamictal, Lithium, Singulair, Protonix, Viagra, Ambien  PROCEDURE:  This is a multichannel  digital polysomnogram utilizing the Somnostar 11.2 system.  Electrodes and sensors were applied and monitored per AASM Specifications.   EEG, EOG, Chin and Limb EMG, were sampled at 200 Hz.  ECG, Snore and Nasal Pressure, Thermal Airflow, Respiratory Effort, CPAP Flow and Pressure, Oximetry was sampled at 50 Hz. Digital video and audio were recorded.    BASELINE STUDY Lights Out was at 21:45 and Lights On at 05:01.  Total recording time (TRT) was 436 minutes, with a total sleep time (TST) of 375.5 minutes.   The patient's sleep latency was 6 minutes.  REM latency was 259 minutes, which is markedly delayed. The sleep efficiency was 86.1%.   SLEEP ARCHITECTURE: WASO (Wake after sleep onset) was 54.5 minutes with mild sleep fragmentation noted. There were 24 minutes in Stage N1, 260 minutes Stage N2, 45.5 minutes Stage N3 and 46 minutes in Stage REM.  The percentage of Stage N1 was 6.4%, Stage N2 was 69.2%, which is increased, Stage N3 was 12.1% and Stage R (REM sleep) was 12.3%, which is reduced. The arousals were noted as: 35 were spontaneous, 0 were associated with PLMs, 15 were associated with respiratory events. RESPIRATORY ANALYSIS:  There were a total of 25 respiratory events:  1 obstructive apneas, 0 central apneas and 0 mixed apneas with a total of 1 apneas and an apnea index (AI) of .2 /hour. There were 24 hypopneas with a hypopnea index of 3.8 /hour. The patient also had 0 respiratory event related arousals (RERAs).    The total APNEA/HYPOPNEA INDEX (AHI) was 4./hour and the total RESPIRATORY DISTURBANCE INDEX was  4. /hour.  1 events occurred in REM sleep and 46 events in NREM. The REM AHI was  1.3 /hour, versus a non-REM AHI of 4.4. The patient spent 24 minutes of total sleep time in the supine position and 352 minutes in non-supine.. The supine AHI was 42.5 versus a non-supine AHI of 1.4. OXYGEN SATURATION & C02:  The Wake baseline 02 saturation was 96%, with the lowest being 88%. Time spent below 89%  saturation equaled 0 minutes. PERIODIC LIMB MOVEMENTS: The patient had a total of 0 Periodic Limb Movements.  The Periodic Limb Movement (PLM) index was 0 and the PLM Arousal index was 0/hour. Audio and video analysis did not show any abnormal or unusual movements, behaviors, phonations or vocalizations. The patient took 2 bathroom breaks. Mild intermittent snoring was noted. The EKG was in keeping with normal sinus rhythm (NSR). Post-study, the patient indicated that sleep was the same as usual. IMPRESSION: 1. Primary Snoring 2. Dysfunctions associated with sleep stages or arousal from sleep RECOMMENDATIONS: 1. This study does not demonstrate any significant obstructive or central sleep disordered breathing with  the exception of mild, intermittent snoring and supine sleep related sleep apnea. Treatment with positive airway pressure is not warranted. Some weight loss and avoidance of the supine sleep position may reduce his snoring and positional sleep apnea. 2. This study shows sleep fragmentation and abnormal sleep stage percentages; these are nonspecific findings and per se do not signify an intrinsic sleep disorder or a cause for the patient's sleep-related symptoms. Causes include (but are not limited to) the first night effect of the sleep study, circadian rhythm disturbances, medication effect or an underlying mood disorder or medical problem. 3. The patient should be cautioned not to drive, work at heights, or operate dangerous or heavy equipment when tired or sleepy. Review and reiteration of good sleep hygiene measures should be pursued with any patient. 4. The patient will be advised to follow up with the referring provider, who will be notified of the test results. I certify that I have reviewed the entire raw data recording prior to the issuance of this report in accordance with the Standards of Accreditation of the American Academy of Sleep Medicine (AASM) Huston FoleySaima Athar, MD, PhD Diplomat, American Board  of Neurology and Sleep Medicine (Neurology and Sleep Medicine)   Chest 2 View  Result Date: 02/21/2021 CLINICAL DATA:  Preop for cervical spine surgery. EXAM: CHEST - 2 VIEW COMPARISON:  July 13, 2020. FINDINGS: The heart size and mediastinal contours are within normal limits. Both lungs are clear. The visualized skeletal structures are unremarkable. IMPRESSION: No active cardiopulmonary disease. Electronically Signed   By: Lupita RaiderJames  Green Jr M.D.   On: 02/21/2021 08:41   DG Cervical Spine 2-3 Views  Result Date: 02/21/2021 CLINICAL DATA:  C5-C7 lateral mass fusion. EXAM: DG C-ARM 1-60 MIN; CERVICAL SPINE - 2-3 VIEW FLUOROSCOPY TIME:  Fluoroscopy Time:  30 seconds. Radiation Exposure Index (if provided by the fluoroscopic device): 8.95 mGy. Number of Acquired Spot Images: 5 COMPARISON:  December 13 21. FINDINGS: Five C-arm fluoroscopic images were obtained intraoperatively and submitted for post operative interpretation. These images demonstrate posterior lateral mass screws and intervening rods bilaterally spanning from C5 to C7. Previously placed C6-C7 ACDF is seen. Please see the performing provider's procedural report for further detail. IMPRESSION: Intraoperative fluoroscopy, as detailed above. Electronically Signed   By: Feliberto HartsFrederick S Jones MD   On: 02/21/2021 13:23   DG C-Arm 1-60 Min  Result Date: 02/21/2021 CLINICAL DATA:  C5-C7 lateral mass fusion. EXAM: DG C-ARM 1-60 MIN; CERVICAL SPINE - 2-3 VIEW FLUOROSCOPY TIME:  Fluoroscopy Time:  30 seconds. Radiation Exposure Index (if provided by the fluoroscopic device): 8.95 mGy. Number of Acquired Spot Images: 5 COMPARISON:  December 13 21. FINDINGS: Five C-arm fluoroscopic images were obtained intraoperatively and submitted for post operative interpretation. These images demonstrate posterior lateral mass screws and intervening rods bilaterally spanning from C5 to C7. Previously placed C6-C7 ACDF is seen. Please see the performing provider's  procedural report for further detail. IMPRESSION: Intraoperative fluoroscopy, as detailed above. Electronically Signed   By: Feliberto HartsFrederick S Jones MD   On: 02/21/2021 13:23    Antibiotics:  Anti-infectives (From admission, onward)   Start     Dose/Rate Route Frequency Ordered Stop   02/21/21 2000  ceFAZolin (ANCEF) IVPB 2g/100 mL premix        2 g 200 mL/hr over 30 Minutes Intravenous Every 8 hours 02/21/21 1436 02/22/21 0432   02/21/21 0815  ceFAZolin (ANCEF) IVPB 2g/100 mL premix        2 g 200 mL/hr over 30  Minutes Intravenous On call to O.R. 02/21/21 0813 02/21/21 1135      Discharge Exam: Blood pressure (!) 135/97, pulse 98, temperature 97.6 F (36.4 C), temperature source Oral, resp. rate 18, height 5\' 9"  (1.753 m), weight 77.1 kg, SpO2 95 %. Neurologic: Grossly normal Ambulating and voiding well, incision CDI   Discharge Medications:   Allergies as of 02/22/2021   No Known Allergies     Medication List    TAKE these medications   albuterol 108 (90 Base) MCG/ACT inhaler Commonly known as: VENTOLIN HFA Inhale 2 puffs into the lungs every 4 (four) hours as needed. For shortness of breath/wheezing   albuterol (2.5 MG/3ML) 0.083% nebulizer solution Commonly known as: PROVENTIL Take 3 mLs (2.5 mg total) by nebulization every 4 (four) hours as needed for wheezing or shortness of breath.   amLODipine 2.5 MG tablet Commonly known as: NORVASC Take 1 tablet (2.5 mg total) by mouth daily.   amphetamine-dextroamphetamine 10 MG tablet Commonly known as: ADDERALL Take 10 mg by mouth daily with breakfast.   buPROPion 300 MG 24 hr tablet Commonly known as: WELLBUTRIN XL Take 300 mg by mouth daily.   busPIRone 15 MG tablet Commonly known as: BUSPAR Take 30 mg by mouth 2 (two) times daily.   cariprazine capsule Commonly known as: VRAYLAR Take 1.5 mg by mouth daily.   Fasenra Pen 30 MG/ML Soaj Generic drug: Benralizumab INJECT 1 PEN UNDER THE SKIN  EVERY 8 WEEKS What  changed:   how much to take  how to take this  when to take this   Fish Oil 1000 MG Caps Take 2,000 mg by mouth daily.   fluticasone 50 MCG/ACT nasal spray Commonly known as: FLONASE SPRAY 2 SPRAYS INTO EACH NOSTRIL EVERY DAY What changed: See the new instructions.   gabapentin 300 MG capsule Commonly known as: NEURONTIN Take 300 mg by mouth at bedtime.   lamoTRIgine 200 MG tablet Commonly known as: LAMICTAL Take 200 mg by mouth 2 (two) times daily.   magnesium oxide 400 MG tablet Commonly known as: MAG-OX Take 400 mg by mouth daily.   methocarbamol 500 MG tablet Commonly known as: ROBAXIN Take 1 tablet (500 mg total) by mouth every 6 (six) hours as needed for muscle spasms.   montelukast 10 MG tablet Commonly known as: SINGULAIR TAKE 1 TABLET BY MOUTH EVERYDAY AT BEDTIME What changed: See the new instructions.   multivitamin with minerals tablet Take 1 tablet by mouth daily.   oxyCODONE 5 MG immediate release tablet Commonly known as: Oxy IR/ROXICODONE Take 1 tablet (5 mg total) by mouth every 4 (four) hours as needed for severe pain ((score 7 to 10)).   pantoprazole 40 MG tablet Commonly known as: PROTONIX TAKE 1 TABLET BY MOUTH EVERY DAY   sildenafil 50 MG tablet Commonly known as: VIAGRA Take 50 mg by mouth as needed for erectile dysfunction.   Trelegy Ellipta 200-62.5-25 MCG/INH Aepb Generic drug: Fluticasone-Umeclidin-Vilant INHALE 1 PUFF ONCE DAILY What changed:   how much to take  how to take this  when to take this   vitamin C 1000 MG tablet Take 1,000 mg by mouth daily.   Xiidra 5 % Soln Generic drug: Lifitegrast Place 1 drop into both eyes in the morning and at bedtime.   zolpidem 10 MG tablet Commonly known as: AMBIEN Take 5-10 mg by mouth at bedtime.       Disposition: home   Final Dx: posterior cervical fusion C5-6, 6-7  Discharge Instructions  Remove dressing in 72 hours   Complete by: As directed    Call MD for:   persistant nausea and vomiting   Complete by: As directed    Call MD for:  redness, tenderness, or signs of infection (pain, swelling, redness, odor or green/yellow discharge around incision site)   Complete by: As directed    Call MD for:  severe uncontrolled pain   Complete by: As directed    Call MD for:  temperature >100.4   Complete by: As directed    Diet - low sodium heart healthy   Complete by: As directed    Increase activity slowly   Complete by: As directed        Follow-up Information    Tia Alert, MD. Schedule an appointment as soon as possible for a visit in 2 week(s).   Specialty: Neurosurgery Contact information: [address] [place] [address] [phone]                Signed: Tiana Loft [facility] 02/22/2021, 8:09 AM

## 2021-02-22 NOTE — Evaluation (Signed)
Occupational Therapy Evaluation Patient Details Name: Erik Ellis MRN: [id number] DOB: 07/18/1966 Today's Date: 02/22/2021    History of Present Illness 55 yo male s/p C6-7 fusion. PMH including asthma, TKA, lumbar sx, and neck sx.   Clinical Impression   PTA, pt was living with his wife and was independent. Currently, pt performing ADLs and functional mobility at Mod I level. Provided education on cervical precautions, grooming, UB ADLs, LB ADLs, toileting, and functional transfers; pt demonstrated understanding. Answered all pt questions. Recommend dc home once medically stable per physician. All acute OT needs met and will sign off. Thank you.    Follow Up Recommendations  No OT follow up;Supervision - Intermittent    Equipment Recommendations  None recommended by OT    Recommendations for Other Services PT consult     Precautions / Restrictions Precautions Precautions: Cervical Precaution Booklet Issued: Yes (comment) Required Braces or Orthoses: Other Brace Other Brace: No brace per MD Restrictions Weight Bearing Restrictions: No      Mobility Bed Mobility Overal bed mobility: Modified Independent                  Transfers Overall transfer level: Modified independent               General transfer comment: Increased time    Balance Overall balance assessment: No apparent balance deficits (not formally assessed)                                         ADL either performed or assessed with clinical judgement   ADL Overall ADL's : Modified independent                                       General ADL Comments: Pt performing ADLs and functional mobility at Mod I Level in with increased time. Providing education on cervical precautions, grooming, bed mobility, UB ADLs, LB ADLs, toileting, and functional transfers. Also providing education on stair management and pt performing without difficulty; moving slowly and  cautiously     Vision         Perception     Praxis      Pertinent Vitals/Pain Pain Assessment: Faces Faces Pain Scale: Hurts little more Pain Location: neck Pain Descriptors / Indicators: Sore Pain Intervention(s): Monitored during session;Repositioned     Hand Dominance Left   Extremity/Trunk Assessment Upper Extremity Assessment Upper Extremity Assessment: Overall WFL for tasks assessed   Lower Extremity Assessment Lower Extremity Assessment: Overall WFL for tasks assessed   Cervical / Trunk Assessment Cervical / Trunk Assessment: Other exceptions Cervical / Trunk Exceptions: s/p neck sx   Communication Communication Communication: No difficulties   Cognition Arousal/Alertness: Awake/alert Behavior During Therapy: WFL for tasks assessed/performed Overall Cognitive Status: Within Functional Limits for tasks assessed                                     General Comments       Exercises     Shoulder Instructions      Home Living Family/patient expects to be discharged to:: Private residence (Simultaneous filing. User may not have seen previous data.) Living Arrangements: Spouse/significant other (Simultaneous filing. User may not have seen previous data.) Available  Help at Discharge: Family;Available 24 hours/day Type of Home: House Home Access: Level entry     Home Layout: Two level;Bed/bath upstairs Alternate Level Stairs-Number of Steps: Flight Alternate Level Stairs-Rails: Can reach both Bathroom Shower/Tub: Tub/shower unit;Walk-in Psychologist, prison and probation services: Standard     Home Equipment: Bedside commode          Prior Functioning/Environment Level of Independence: Independent        Comments: Works as a Designer, industrial/product Problem List: Decreased range of motion;Decreased knowledge of use of DME or AE;Decreased knowledge of precautions;Pain      OT Treatment/Interventions:      OT Goals(Current  goals can be found in the care plan section) Acute Rehab OT Goals Patient Stated Goal: Go home OT Goal Formulation: All assessment and education complete, DC therapy  OT Frequency:     Barriers to D/C:            Co-evaluation              AM-PAC OT "6 Clicks" Daily Activity     Outcome Measure Help from another person eating meals?: None Help from another person taking care of personal grooming?: None Help from another person toileting, which includes using toliet, bedpan, or urinal?: None Help from another person bathing (including washing, rinsing, drying)?: None Help from another person to put on and taking off regular upper body clothing?: None Help from another person to put on and taking off regular lower body clothing?: None 6 Click Score: 24   End of Session Nurse Communication: Mobility status  Activity Tolerance: Patient tolerated treatment well Patient left: in chair;with call bell/phone within reach  OT Visit Diagnosis: Unsteadiness on feet (R26.81);Other abnormalities of gait and mobility (R26.89);Muscle weakness (generalized) (M62.81)                Time: 6659-9357 OT Time Calculation (min): 22 min Charges:  OT General Charges $OT Visit: 1 Visit OT Evaluation $OT Eval Low Complexity: [place], OTR/L Acute Rehab Pager: [phone] Office: [place] 02/22/2021, 8:42 AM

## 2021-02-22 NOTE — Progress Notes (Signed)
Patient is discharged from room 3C08 at this time. Alert and in stable condition. IV site d/c'd and instructions read to patient and spouse with understanding verbalized and all questions answered. Left unit via wheelchair with all belongings at side. 

## 2021-02-22 NOTE — Discharge Instructions (Signed)

## 2021-02-23 ENCOUNTER — Telehealth: Payer: Self-pay

## 2021-02-23 NOTE — Telephone Encounter (Signed)
Transition Care Management Unsuccessful Follow-up Telephone Call  Date of discharge and from where:  02/22/2021 from [place]  Attempts:  1st Attempt  Reason for unsuccessful TCM follow-up call:  Left voice message     

## 2021-02-26 NOTE — Telephone Encounter (Signed)
Transition Care Management Follow-up Telephone Call  Date of discharge and from where: 02/22/2021 from   How have you been since you were released from the hospital? Pt states that he is feeling better today. He has follow up appts with Neurosurgery.   Any questions or concerns? No  Items Reviewed:  Did the pt receive and understand the discharge instructions provided? Yes   Medications obtained and verified? Yes   Other? No   Any new allergies since your discharge? No   Dietary orders reviewed? n/a  Do you have support at home? Yes   Functional Questionnaire: (I = Independent and D = Dependent) ADLs: I  Bathing/Dressing- I  Meal Prep- I  Eating- I  Maintaining continence- I  Transferring/Ambulation- I  Managing Meds- I   Follow up appointments reviewed:   PCP Hospital f/u appt confirmed? No   Specialist Hospital f/u appt confirmed? Yes  Scheduled to see Dr. Yetta Barre 03/06/2021 @ 08:30am.  Are transportation arrangements needed? No   If their condition worsens, is the pt aware to call PCP or go to the Emergency Dept.? Yes  Was the patient provided with contact information for the PCP's office or ED? Yes  Was to pt encouraged to call back with questions or concerns? Yes

## 2021-03-01 ENCOUNTER — Telehealth: Payer: Self-pay | Admitting: Family Medicine

## 2021-03-01 NOTE — Telephone Encounter (Signed)
Ladona Ridgel, Malena M, DO  P [facility] call pt to have him get thyroid labs that I put in computer tsh, t3, and t4.   In the next week if possible.   Thx.   Dr. Ladona Ridgel    Pt contacted and reminded to get labs done. Pt verbalized understanding. Pt states he just had surgery but will try to get up here next week to do the labs.

## 2021-03-07 ENCOUNTER — Other Ambulatory Visit: Payer: Self-pay

## 2021-03-07 ENCOUNTER — Encounter: Payer: Self-pay | Admitting: Allergy & Immunology

## 2021-03-07 ENCOUNTER — Ambulatory Visit: Payer: BC Managed Care – PPO | Admitting: Allergy & Immunology

## 2021-03-07 VITALS — BP 126/80 | HR 88 | Temp 98.7°F | Resp 14 | Ht 69.0 in | Wt 168.8 lb

## 2021-03-07 DIAGNOSIS — J4540 Moderate persistent asthma, uncomplicated: Secondary | ICD-10-CM | POA: Diagnosis not present

## 2021-03-07 DIAGNOSIS — J310 Chronic rhinitis: Secondary | ICD-10-CM

## 2021-03-07 DIAGNOSIS — K219 Gastro-esophageal reflux disease without esophagitis: Secondary | ICD-10-CM

## 2021-03-07 DIAGNOSIS — U099 Post covid-19 condition, unspecified: Secondary | ICD-10-CM

## 2021-03-07 MED ORDER — FLUTICASONE PROPIONATE 50 MCG/ACT NA SUSP: NASAL | 0 refills | Status: DC

## 2021-03-07 MED ORDER — ALBUTEROL SULFATE (2.5 MG/3ML) 0.083% IN NEBU: 2.5000 mg | INHALATION_SOLUTION | RESPIRATORY_TRACT | 2 refills | Status: DC | PRN

## 2021-03-07 MED ORDER — TRELEGY ELLIPTA 200-62.5-25 MCG/INH IN AEPB: INHALATION_SPRAY | RESPIRATORY_TRACT | 1 refills | Status: DC

## 2021-03-07 MED ORDER — MONTELUKAST SODIUM 10 MG PO TABS: ORAL_TABLET | ORAL | 1 refills | Status: DC

## 2021-03-07 NOTE — Patient Instructions (Addendum)
1. Moderate persistent asthma without complication - Lung function looks great today.  - We will make no changes today. - More samples of Trelegy provided. - Daily controller medication(s): Singulair 10mg  daily and Trelegy 200/62.5/25 one puff once daily and Fasenra (every 8 weeks) - Prior to physical activity: ProAir 2 puffs 10-15 minutes before physical activity. - Rescue medications: ProAir 4 puffs every 4-6 hours as needed - Asthma control goals:  * Full participation in all desired activities (may need albuterol before activity) * Albuterol use two time or less a week on average (not counting use with activity) * Cough interfering with sleep two time or less a month * Oral steroids no more than once a year * No hospitalizations  2. Non-allergic rhinitis - We will not make any medication changes at all.  - Continue with: Flonase (fluticasone) one spray per nostril daily   3. Return in about 6 months (around 09/07/2021).    Please inform 13/02/2021 of any Emergency Department visits, hospitalizations, or changes in symptoms. Call us before going to the ED for breathing or allergy symptoms since we might be able to fit you in for a sick visit. Feel free to contact us anytime with any questions, problems, or concerns.  It was a pleasure to see you again today! Good luck with the recovery from your neck surgery!   Websites that have reliable patient information: 1. American Academy of Asthma, Allergy, and Immunology: www.aaaai.org 2. Food Allergy Research and Education (FARE): foodallergy.org 3. Mothers of Asthmatics: http://www.asthmacommunitynetwork.org 4. American College of Allergy, Asthma, and Immunology: www.acaai.org   COVID-19 Vaccine Information can be found at: [place] For questions related to vaccine distribution or appointments, please email [email or web address] or call [phone].   We realize that you  might be concerned about having an allergic reaction to the COVID19 vaccines. To help with that concern, WE ARE OFFERING THE COVID19 VACCINES IN OUR OFFICE! Ask the front desk for dates!     "Like" [phone] on Facebook and Instagram for our latest updates!      A healthy democracy works best when [place] participate! Make sure you are registered to vote! If you have moved or changed any of your contact information, you will need to get this updated before voting!  In some cases, you MAY be able to register to vote online: [employer]

## 2021-03-07 NOTE — Progress Notes (Signed)
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 Moderate persistent asthma without complication - Lung function looks great today.  - We will make no changes today. - More samples of Trelegy provided. - Daily controller medication(s): Singulair 10mg  daily and Trelegy 200/62.5/25 one puff once daily and Fasenra (every 8 weeks) - Prior to physical activity: ProAir 2 puffs 10-15 minutes before physical activity. - Rescue medications: ProAir 4 puffs every 4-6 hours as needed - Asthma control goals:  * Full participation in all desired activities (may need albuterol before activity) * Albuterol use two time or less a week on average (not counting use with activity) * Cough interfering with sleep two time or less a month * Oral steroids no more than once a year * No hospitalizations  2. Non-allergic rhinitis - We will not make any medication changes at all.  - Continue with: Flonase (fluticasone) one spray per nostril daily   3. Return in about 6 months (around 09/07/2021).    Subjective:   Erik Ellis is a 55 y.o. male presenting today for follow up of  Chief Complaint  Patient presents with  . Asthma    Erik Ellis has a history of the following: Patient Active Problem List   Diagnosis Date Noted  . S/P cervical spinal fusion 02/21/2021  . Fatigue 08/15/2020  . Physical deconditioning 08/15/2020  . History of COVID-19 08/15/2020  . COVID-19 virus infection 07/03/2020  . Moderate persistent asthma without complication 05/12/2020  . Non-allergic rhinitis 05/12/2020  . Lithium use 10/04/2019  .  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 Mild renal insufficiency 10/04/2019  . Frequent headaches 07/03/2016  . Elevated serum creatinine 05/02/2016  . Allergic rhinitis 02/17/2016  . Asthma with acute exacerbation 05/31/2015  . Bipolar disorder (HCC) 05/31/2015  . Gastroesophageal reflux disease 05/31/2015  . Asthma, mild persistent 04/27/2013  . Diverticulosis 09/02/2012  . H/O colonoscopy with polypectomy 09/02/2012  . Sleep disorder 12/04/2011    History obtained from: chart review and patient.  Erik Ellis is a 55 y.o. male presenting for a follow up visit.  He was last seen in December 2021.  At that time, his lung function looked great.  He continued on Singulair as well as Trelegy and Fasenra every 8 weeks.  We will continue with his Flonase for his nonallergic rhinitis.   Since last visit, he has mostly done well.   Asthma/Respiratory Symptom History: He remains on the Trelegy. He is on the Singulair. He is also on the [place] every 8 weeks. He is not coughing at night. He has nearly fully recovered from his COVID. He does report brain fog from the COVID. He is continuing to mix up things. Erik Ellis's asthma has been well controlled. He has not required rescue medication, experienced nocturnal awakenings due to lower respiratory symptoms, nor have activities of daily living been limited. He has required no Emergency Department or Urgent Care visits for his asthma. He has required zero courses of systemic steroids for asthma exacerbations since the last visit. ACT score today is 25, indicating excellent asthma symptom control.    Review of his prednisone use shows 2 courses in 2017, 2  courses in 2018, 4 courses in 2019, 4 courses in 2020, and 5 courses thus far in 2021. However, it should be noted that 2 of the 5 courses in 2021 were prescribed by myself right before he was diagnosed with COVID-19,which I am not sure Erik Ellis would have affected anyway. His first dose of Erik Ellis was in  December 2020.  Allergic Rhinitis Symptom History: He remains on the Flonase one spray per nostril daily. He is also on the montelukast. He has not needed antibiotics at all since the last visit. He is overall doing well.  He had surgery on his neck two weeks ago. He was in the hospital for one night. He had a bulging disc. This was a better time for his schedule. He was having headaches every day. Headaches have now resolved. Cortisone shot worked initially but then it came back. This was his second neck surgery.   He continues to have some short term memory loss from the long hauler COVID. He also has some problems with word finding when he is talking out loud, which proves to be a real frustration for him. He thinks that he is slowly improving over time.  Otherwise, there have been no changes to his past medical history, surgical history, family history, or social history.    Review of Systems  Constitutional: Negative.  Negative for fever, malaise/fatigue and weight loss.  HENT: Negative.  Negative for congestion, ear discharge and ear pain.   Eyes: Negative for pain, discharge and redness.  Respiratory: Negative for cough, sputum production, shortness of breath and wheezing.   Cardiovascular: Negative.  Negative for chest pain and palpitations.  Gastrointestinal: Negative for abdominal pain, heartburn, nausea and vomiting.  Musculoskeletal: Positive for neck pain.  Skin: Negative.  Negative for itching and rash.  Neurological: Negative for dizziness and headaches.  Endo/Heme/Allergies: Negative for environmental allergies. Does not bruise/bleed easily.  Psychiatric/Behavioral: Positive for memory loss.       Objective:   Blood pressure 126/80, pulse 88, temperature 98.7 F (37.1 C), temperature source Temporal, resp. rate 14, height 5\' 9"  (1.753 m), weight 168 lb 12.8 oz (76.6 kg), SpO2 97 %. Body mass index is 24.93 kg/m.   Physical Exam:  Physical Exam Constitutional:       Appearance: He is well-developed.     Comments: Smiling. Very friendly.   HENT:     Head: Normocephalic and atraumatic.     Right Ear: Tympanic membrane, ear canal and external ear normal.     Left Ear: Tympanic membrane, ear canal and external ear normal.     Nose: No nasal deformity, septal deviation, mucosal edema or rhinorrhea.     Right Turbinates: Enlarged and swollen.     Left Turbinates: Enlarged and swollen.     Right Sinus: No maxillary sinus tenderness or frontal sinus tenderness.     Left Sinus: No maxillary sinus tenderness or frontal sinus tenderness.     Mouth/Throat:     Mouth: Mucous membranes are not pale and not dry.     Pharynx: Uvula midline.     Comments: Cobblestoning in the posterior oropharynx. Eyes:     General:        Right eye: No discharge.        Left eye: No discharge.     Conjunctiva/sclera: Conjunctivae normal.     Right eye: Right conjunctiva is not injected. No chemosis.    Left eye: Left conjunctiva is not injected. No chemosis.    Pupils:  Pupils are equal, round, and reactive to light.  Cardiovascular:     Rate and Rhythm: Normal rate and regular rhythm.     Heart sounds: Normal heart sounds.  Pulmonary:     Effort: Pulmonary effort is normal. No tachypnea, accessory muscle usage or respiratory distress.     Breath sounds: Normal breath sounds. No wheezing, rhonchi or rales.     Comments: Decreased air movement at the bases. Overall sounds great. Chest:     Chest wall: No tenderness.  Lymphadenopathy:     Cervical: No cervical adenopathy.  Skin:    Coloration: Skin is not pale.     Findings: No abrasion, erythema, petechiae or rash. Rash is not papular, urticarial or vesicular.  Neurological:     Mental Status: He is alert.  Psychiatric:        Behavior: Behavior is cooperative.      Diagnostic studies:    Spirometry: results normal (FEV1: 3.15/88%, FVC: 3.80/83%, FEV1/FVC: 83%).    Spirometry consistent with normal pattern.    Allergy Studies: none      Malachi Bonds, MD  [facility]

## 2021-03-08 ENCOUNTER — Other Ambulatory Visit: Payer: Self-pay

## 2021-03-08 DIAGNOSIS — R7989 Other specified abnormal findings of blood chemistry: Secondary | ICD-10-CM

## 2021-03-08 LAB — T3, FREE: T3, Free: 2.9 pg/mL (ref 2.0–4.4)

## 2021-03-08 LAB — T4, FREE: Free T4: 1.12 ng/dL (ref 0.82–1.77)

## 2021-03-08 LAB — TSH: TSH: 0.259 u[IU]/mL — ABNORMAL LOW (ref 0.450–4.500)

## 2021-03-13 ENCOUNTER — Encounter: Payer: Self-pay | Admitting: Counselor

## 2021-03-13 ENCOUNTER — Other Ambulatory Visit: Payer: Self-pay

## 2021-03-13 ENCOUNTER — Ambulatory Visit (INDEPENDENT_AMBULATORY_CARE_PROVIDER_SITE_OTHER): Payer: BC Managed Care – PPO | Admitting: Counselor

## 2021-03-13 ENCOUNTER — Ambulatory Visit: Payer: BC Managed Care – PPO

## 2021-03-13 DIAGNOSIS — F3175 Bipolar disorder, in partial remission, most recent episode depressed: Secondary | ICD-10-CM

## 2021-03-13 DIAGNOSIS — G3184 Mild cognitive impairment, so stated: Secondary | ICD-10-CM

## 2021-03-13 DIAGNOSIS — F09 Unspecified mental disorder due to known physiological condition: Secondary | ICD-10-CM

## 2021-03-13 NOTE — Patient Instructions (Signed)
I would like to provide you with the following resources, which you should not hesitate to use if you have any thoughts of harming yourself or others:   Find Help 24/7, [place] Call our 24-hour HelpLine at 336-832-9700 or 800-711-2635     National Hopeline Network: 1-800-SUICIDE    The National Suicide Prevention Lifeline: 1-800-273-TALK 

## 2021-03-13 NOTE — Progress Notes (Signed)
NEUROPSYCHOLOGICAL TEST SCORES [facility]  Patient Name: Erik Ellis MRN: [id number] Date of Birth: [birth year] Age: 55 y.o. Education: 18 years  Measurement properties of test scores: IQ, Index, and Standard Scores (SS): Mean = 100; Standard Deviation = 15 Scaled Scores (Ss): Mean = 10; Standard Deviation = 3 Z scores (Z): Mean = 0; Standard Deviation = 1 T scores (T); Mean = 50; Standard Deviation = 10  TEST SCORES:    Note: This summary of test scores accompanies the interpretive report and should not be interpreted by unqualified individuals or in isolation without reference to the report. Test scores are relative to age, gender, and educational history as available and appropriate.   Performance Validity        ACS: Raw  Descriptor      Word Choice 41 Below Expectation      Rey 15 and Recognition: Raw  Descriptor      Free Recall 12 Within Expectation      Recognition 9 ---      False Positives 0 ---      Combined Score 21 Within Expectation       Raw  Descriptor  The Dot Counting Test: 15 Below Expectation      Embedded Measures: Raw  Descriptor      NAB Effort Index 3 Below Expectation      Mental Status Screening     Total Score Descriptor  MoCA 21 MCI      Expected Functioning        Wide Range Achievement Test: Standard/Scaled Score Percentile      Word Reading 88 21      Reynolds Intellectual Screening Test Standard/T-score Percentile      Guess What 42 21      Odd Item Out 44 27  RIST Index 91 27      Attention/Processing Speed        Neuropsychological Assessment Battery (Attention Module, Form 1): Scaled/T-score Percentile  Attention Index (ATT): 59 <1      Digits Forward 42 21      Digits Backwards 31 3      Dots 34 5      Numbers & Letters A Speed 34 5      Numbers & Letters A Accuracy 34 5      Numbers & Letters A Efficiency 30 2      Numbers & Letters B Efficiency 33 5      Numbers & Letters C Efficiency 37 9      Numbers &  Letters D Efficiency 35 7      Numbers & Letters D Disruption 42 21      Driving Scenes 27 1      Language        Neuropsychological Assessment Battery (Language Module, Form 1): T-score Percentile      Oral Production 53 62      Naming   (31) 52 58      Verbal Fluency:  T Score Percentile      Controlled Oral Word Association (F-A-S) 37 9      Semantic Fluency (Animals) 11 <1      Memory:        Neuropsychological Assessment Battery (Memory Module, Form 1): T-score/Standard Score Percentile  Memory Index (MEM): 62 1      List Learning           List A Immediate Recall   (3 , 4 , 5) 24 <1  List B Immediate Recall   (3) 37 9         List A Short Delayed Recall   (3) 24 <1         List A Long Delayed Recall   (4) 30 2         List A Percent Retention   (133 %) --- 88         List A Long Delayed Yes/No Recognition Hits   (8) --- 2         List A Long Delayed Yes/No Recognition False Alarms   (9) --- 1         List A Recognition Discriminability Index --- <1      Shape Learning           Immediate Recognition   (4 , 5 , 3) 35 7         Delayed Recognition   (3) 26 1         Percent Retention   (100 %) --- 50         Delayed Forced-Choice Recognition Hits   (7) --- 27         Delayed Forced-Choice Recognition False Alarms   (5) --- <1         Delayed Forced-Choice Recognition Discriminability --- <1      Story Learning           Immediate Recall   (14, 25) 30 2         Delayed Recall   (25) 37 9         Percent Retention   (100 %) --- 69      Daily Living Memory            Immediate Recall   (21, 14) 33 5          Delayed Recall   (7, 3) 28 2          Percent Retention (71 %) --- 4          Recognition Hits   (8) --- 25      Visuospatial/Constructional Functioning        Neuropsychological Assessment Battery (Visuospatial Module) T-score Percentile      Visual Discrimination 37 10      Design Construction 31 3      Executive Functioning        Modified [place]  Card Sorting Test (MWCST): Standard/T-Score Percentile      Number of Categories Correct 28 2      Number of Perseverative Errors 34 5      Number of Total Errors 34 5      Percent Perseverative Errors 43 25  Executive Function Composite 69 2      Trail Making Test: T-Score Percentile      Part A 49 46      Part B 36 8      Clock Drawing Raw Score Descriptor      Command 7 Mild Impairment      Rating Scales         Raw Score Descriptor  Beck Depression Inventory - II 13 Within Normal Limits  Beck Anxiety Inventory 5 Within Normal Limits    Olevia Westervelt V. Roseanne Reno PsyD, ABN Clinical Neuropsychologist
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
## 2021-03-13 NOTE — Progress Notes (Signed)
   Psychometrist Note   Cognitive testing was administered to Erik Ellis by Lamar Benes, B.S. (Technician) under the supervision of Alphonzo Severance, Psy.D., ABN. [name] was able to tolerate all test procedures. Dr. Nicole Kindred met with the patient as needed to manage any emotional reactions to the testing procedures. Rest breaks were offered.    The battery of tests administered was selected by Dr. Nicole Kindred with consideration to the patient's current level of functioning, the nature of his symptoms, emotional and behavioral responses during the interview, level of literacy, observed level of motivation/effort, and the nature of the referral question. This battery was communicated to the psychometrist. Communication between Dr. Nicole Kindred and the psychometrist was ongoing throughout the evaluation and Dr. Nicole Kindred was immediately accessible at all times. Dr. Nicole Kindred provided supervision to the technician on the date of this service, to the extent necessary to assure the quality of all services provided.    Mr. Gargis will return in approximately one week for an interactive feedback session with Dr. Nicole Kindred, at which time test performance, clinical impressions, and treatment recommendations will be reviewed in detail. The patient understands he can contact our office should he require our assistance before this time.   A total of 170 minutes of billable time were spent with Erik Ellis by the technician, including test administration and scoring time. Billing for these services is reflected in Dr. Les Pou note.   This note reflects time spent with the psychometrician and does not include test scores, clinical history, or any interpretations made by Dr. Nicole Kindred. The full report will follow in a separate note.

## 2021-03-15 NOTE — Progress Notes (Signed)
Erik Ellis  Patient Name: Erik Ellis MRN: [id number] Date of Birth: [birth year] Age: 55 y.o. Education: 18 years  Clinical Impressions  Erik Ellis is a 55 y.o., right-hand dominant, married man with a longstanding history of bipolar affective disorder who was in his normal state of health until developing COVID-19 in August, 2019. He was quite sick and unable to return to work for about a month and then shortly after returning, he began to have pain in his neck and headaches. During this time, he also experienced an exacerbation of his affective issues, resulting in multiple medication titrations, and he started noticing cognitive changes at that time (November, 2021). His neck pain and the recurrent headaches  have been alleviated by surgery (completed several weeks ago) but he still feels as though he is having cognitive failures, such as misplacing things, forgetting what he is doing in the middle of tasks and the like. He reported that these issues do not appreciably interfere with the performance of his job and his wife has noticed but is not concerned. He feels like "something is wrong" and is quite concerned. He has an MRI that was reportedly unremarkable as per Dr. Guadelupe Sabin note, although I was unable to find the study in the imaging or media section so I could not review it myself.   On neuropsychological assessment, there is suggestion of suboptimal performance validity. In the patient's case, it is conceivable that some of his performance validity test failures may reflect genuine cognitive difficulties as opposed to a validity problem per se, and thus they are viewed as tempering as opposed to frankly invalidating his test data. Interpretation is further confounded by suspected artifact related to demographic correction. With those considerations in mind, there were low scores in numerous areas that can be summed up as mainly reflecting  reduced cognitive efficiency and executive control. He had low scores on measures of attention and processing efficiency, nearly all tests with a timed component, he had difficulties with memory encoding, and he performed poorly on several measures of executive abilities. There is also less than expected visuospatial/constructional performance, which is somewhat unexpected and does not hang as well with the other test findings. He retained information adequately across time in most cases and does not appear to have a memory storage problem. He did adequately on measures of fundamental language abilities. Interestingly, he reported minimal levels of depression and anxiety symptoms although on interview, he reported depressed mood up to and including suicidal thoughts. He had no plan or intent and was able to express an ongoing commitment to safety.   Erik Ellis is thus demonstrating some level of cognitive difficulty that appears to be characterized mainly by executive control and cognitive efficiency issues. I think his test data are significantly depressed by minor performance validity issues and counfound from demographic correction, although the overall profile seems to corroborate his report of cognitive difficulties day-to-day. So long as all underlying medical causes have been ruled out, it seems likely that these low scores are on the basis of modifiable risk factors and reversible causes including ongoing interference from his affective issues, medication side effects, thyroid issues, and the like. 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 The possibility of an underlying condition cannot be entirely excluded as the burden of low test scores is significant, although there is a very low level of suspicion for neurodegeneration in an individual  his age and he has no known neurological issues  (e.g., no history of stroke, major head injury, etc.). Would recommend that he assertively manage his mental health conditions. He may benefit from psychotherapy in addition to medication management, which can be of significant benefit in bipolar disorder. He also has follow up regarding low TSH with endocrinology. He should return for repeat evaluation in 1 to 2 years if his symptoms continue to worsen or do not improve.   Diagnostic Impressions: Mild neurocognitive disorder Bipolar disorder (by history)  Recommendations to be discussed with patient  Your performance and presentation on assessment today were consistent with performance validity problems. Just like a patient must lie still when undergoing an Xray for the picture to be interpretable, there are a set of assumptions that must be met for neuropsychological test findings to be of meaning. Your performance on validity measures suggests that we cannot be entirely sure that was the case. Validity problems can be caused by any number of things such as fatigue, difficulty maintaining attention, sensory impairments, idiosyncratic approaches to test taking, or misunderstanding test instructions. It is possible that some of your validity test failures are on the basis of the issues you are noticing day to day and thus, they are viewed as tempering expectations for your test findings as opposed to frankly invalidating them.  Beyond that, you had numerous low scores that I think can be best summed up as involving difficulties with executive control, cognitive efficiency/rapid processing of information, working memory, and executive function. You  also had low scores on measures of visuospatial function, the significance of which is not clear. Because you have a Masters level education, the expectation for your performance is quite high, and it is possible that your test data look worse than they otherwise would for this reason. Nevertheless, I do think  these findings are sufficient for a diagnosis of mild neurocognitive disorder.   Mild neurocognitive disorder is a term for thinking and memory problems that are detectable on cognitive test but do not appreciably interfere with your functioning day to day. They may cause you to be less efficient, make errors, or otherwise get in the way, but you are still able to perform essential activities such as at work, in terms of managing finances and the like.  My best guess is that your mild neurocognitive disorder is due to your ongoing affective issues (I.e., "chemical imbalance" as you put it), distraction related to chronic pain, and interference from medications. Any of these factors on their own can contribute to cognitive inefficiency and problems, and they are likely that much more impactful when present together. My best advice would be for you to work on these ongoing issues, not overly focus on cognitive performance, and be patient with yourself. There may be some contribution from COVID-19, although the extent and presence of cognitive difficulties following COVID infection remains controversial.   Depression can affect cognitive functioning in several ways. For one, there are neurobiological changes in depression that can contribute to attention, concentration, and memory problems. These changes are so common they are part of the criteria we use to diagnose depressive disorders. Depression can also negatively impact your own appraisal of your cognitive abilities, leading you to feel like you are performing more poorly than is objectively warranted. You are already following with Dr. Jimmye Norman and may also benefit from psychotherapy, which you could look into. Psychotherapy can be particularly helpful when combined with medication management in bipolar disorder.  You have follow up with endocrinology related to your low TSH. Thyroid issues can affect cognitive efficiency among other things.   You  should know that some of the medications you are on (specifically, Vraylar, Lamotrigine, and Neurontin) can contribute to memory and thinking problems. This is not to say they should be avoided, these medications may be needed for stabilization of the underlying conditions they are prescribed to treat, although you should know that may be part of your problem.   Avoid overfocusing on cognitive performance. Memory and cognition are notoriously fallible and if you are looking for cognitive problems, you are bound to find them. Once someone gets worried about their memory and thinking, they may overfocus on how they are doing day-to-day, and then when normal day-to-day cognitive errors are made, this becomes a cause for more concern. This concern and anxiety then decreases focus from the task at hand, reducing concentration, causing more cognitive problems, and creating a vicious cycle. Rather than critiquing your performance, I would encourage you to remain present minded and focus on the task at hand. Perhaps most importantly, have reasonable expectations for yourself.  Healthy people forget things, lose focus, and do not perform 100% correctly all the time. Some cognitive errors are normal and are not necessarily a sign that there is something wrong with your brain.   Finally, I do not think you likely have a condition that is at risk for further decline, such as a developing dementia syndrome, although if you feel that your issues are worsening or fail to improve, you can certainly return for reevaluation in 1 to 2 years.   Test Findings  Test scores are summarized in additional documentation associated with this encounter. Test scores are relative to age, gender, and educational history as available and appropriate. There were concerns about performance validity, with performance falling below expectations on two standalone measures and one embedded measure. His performance was within expectation on one  standalone measure. In this patient's case, it is conceivable that some of these failures are related to the cognitive issues that he is noticing day-to-day, and thus they are viewed as tempering as opposed to frankly invalidating his cognitive test performance.    General Intellectual Functioning/Achievement:  Performance on single word reading was low average and performance on the RIST index was at the lower portion of the average range. He performed near the margin of the low average and average ranges on its constituent verbally and visually oriented subtests. Therefore, average presents as a reasonable standard of comparison for his cognitive test data.   Attention and Processing Efficiency: Indicators of attention and processing efficiency fell at an extremely low level, with low scores on nearly all indicators with the exception of digit repetition forward. Digit repetition forward was low average and digit repetition backward was unusually low. Performance on visually mediated indicators of attention involving identifying changes to a series of printed stimulus materials was unusually low for dot patterns and extremely low for driving scenes. On timed indicators of processing speed, his performance was unusually low to low average across the board. The pattern of scores shows comparable levels of problems with speed and accuracy, as opposed to speed accuracy trade off. I would note that his performance did not decrease significantly with the addition of additional task demands, so he was able to maintain his level of performance even in the face of additional task demands.   Language: Performance on measures of language revealed intact visual object confrontation naming.  Generation of words in response to given letters was low average (almost unusually low) whereas generation of words in response to the category prompt "animals" was extremely low. He performed at normal levels on measures of  fundamental abilities including oral production in response to a picture stimulus and visual object confrontation naming.   Visuospatial Function: Performance on visuospatial and constructional measures was below expectation, with low average (nearly unusually low) visual discrimination and unusually low Catering manager.   Learning and Memory: Performance on indicators of learning and memory was low, below expectations, with an extremely low performance level on the overall index. The pattern shows more difficulties with encoding than it does retention problems and his retention of information across time was generally good. This may be on the basis of attention and executive interference and is not very concerning for a storage problem.   In the verbal realm, he demonstrated extremely low learning for a 12-item word list with 2, 4, and 5 words across three learning trials. Short delayed recall was extremely low at 3 words and long delayed recall was unusually low at 4 words. His recognition discriminability for the information was also poor with more false positives than correct identifications. Many of these were from a distractor alternate list but not all. Performance when learning a short story was unusually low on immediate recall albeit with 100% retention of information and low average delayed recall. Memory for brief daily-living type information was unusually low on immediate recall and extremely low on delayed recall.   In the visual realm, his immediate recall for a series of designs that is difficult to verbally encode was unusually low. He nevertheless retained information well but delayed recall was still extremely low. Recognition was also extremely low when using a yes/no recognition format.   Executive Functions: Performance was low on most measures of executive abilities with an extremely low score on the [place] Composite of the [place] of words in response to the letters F-A-S was low average. Performance was unusually low when alternating sequencing numbers and letters of the alphabet. Clock drawing was consistent with mild impairment, with missing numbers and a sloppy quality to the clock.   Rating Scale(s): Mr. Nabor somewhat surprisingly scored at subclinical levels with respect to depressive symptoms and anxiety symptoms. This is surprising given that he reported very low mood over the past two weeks, to the point a risk assessment had to be conducted because he has had some suicidal ideation (he has no intent or plan positively and felt as though he is doing better the past few days with those symptoms).   Viviano Simas Nicole Kindred, PsyD, ABN Clinical Neuropsychologist  Coding and Compliance  Billing below reflects technician time, my direct face-to-face time with the patient, time spent in test administration, and time spent in professional activities including but not limited to: neuropsychological test interpretation, integration of neuropsychological test data with clinical history, report preparation, treatment planning, care coordination, and review of diagnostically pertinent medical history or studies.   Services associated with this encounter: Clinical Interview [phone]) plus 215 minutes ([id number]; Neuropsychological Evaluation by Professional)  170 minutes ([id number]; Neuropsychological Testing by Technician)

## 2021-03-16 ENCOUNTER — Telehealth: Payer: Self-pay | Admitting: Neurology

## 2021-03-16 NOTE — Telephone Encounter (Signed)
Please call patient and advise him that we can monitor him clinically for his cognitive complaints.  Recent neuropsychological evaluation did not show any telltale concern for dementia thankfully.  I would recommend that he continue to follow-up for now with his primary care physician and he is supposed to see an endocrinologist as well as I understand.  He is also advised to continue follow-up with his psychiatrist.  We can see him back about 6 months after his visit, please offer follow-up appointment in or around September for a recheck with [place].

## 2021-03-19 NOTE — Telephone Encounter (Signed)
See my chart message from 03/19/21.

## 2021-03-20 ENCOUNTER — Encounter: Payer: Self-pay | Admitting: Counselor

## 2021-03-20 ENCOUNTER — Other Ambulatory Visit: Payer: Self-pay

## 2021-03-20 ENCOUNTER — Ambulatory Visit (INDEPENDENT_AMBULATORY_CARE_PROVIDER_SITE_OTHER): Payer: BC Managed Care – PPO | Admitting: Counselor

## 2021-03-20 DIAGNOSIS — F3175 Bipolar disorder, in partial remission, most recent episode depressed: Secondary | ICD-10-CM | POA: Diagnosis not present

## 2021-03-20 DIAGNOSIS — G3184 Mild cognitive impairment, so stated: Secondary | ICD-10-CM | POA: Diagnosis not present

## 2021-03-20 DIAGNOSIS — F0670 Mild neurocognitive disorder due to known physiological condition without behavioral disturbance: Secondary | ICD-10-CM | POA: Insufficient documentation

## 2021-03-20 NOTE — Progress Notes (Signed)
NEUROPSYCHOLOGY FEEDBACK NOTE [facility]  Feedback Note: I met with Erik Ellis to review the findings resulting from his neuropsychological evaluation. Since the last appointment, he has been about the same. He did call Dr. Jimmye Norman and was able to move his appointment, they are titrating medications. He stated that he has in fact been feeling somewhat better than at the previous appointment, and he is no longer having thoughts of self-harm. Time was spent reviewing the impressions and recommendations that are detailed in the evaluation report. We discussed impression of mild neurocognitive disorder, that is likely related to multiple issues including medication side effects, mood destabilization, and the like. I have a low index of suspicion for neurodegeneration in an individual his age and his test findings are not concerning for Alzheimer's. Further interventions as reflected in the patient instructions. I took time to explain the findings and answer all the patient's questions. I encouraged Erik Ellis to contact me should he have any further questions or if further follow up is desired.   Current Medications and Medical History   Current Outpatient Medications  Medication Sig Dispense Refill  . albuterol (PROVENTIL) (2.5 MG/3ML) 0.083% nebulizer solution Take 3 mLs (2.5 mg total) by nebulization every 4 (four) hours as needed for wheezing or shortness of breath. 180 mL 2  . albuterol (VENTOLIN HFA) 108 (90 Base) MCG/ACT inhaler Inhale 2 puffs into the lungs every 4 (four) hours as needed. For shortness of breath/wheezing 18 g 2  . amLODipine (NORVASC) 2.5 MG tablet Take 1 tablet (2.5 mg total) by mouth daily. 90 tablet 1  . amphetamine-dextroamphetamine (ADDERALL) 10 MG tablet Take 10 mg by mouth daily with breakfast.    . Ascorbic Acid (VITAMIN C) 1000 MG tablet Take 1,000 mg by mouth daily.    . Benralizumab (FASENRA PEN) 30 MG/ML SOAJ INJECT 1 PEN UNDER THE SKIN  EVERY 8  WEEKS (Patient taking differently: Inject 30 mg into the skin every 8 (eight) weeks. INJECT 1 PEN UNDER THE SKIN  EVERY 8 WEEKS) 1 mL 6  . buPROPion (WELLBUTRIN XL) 300 MG 24 hr tablet Take 300 mg by mouth daily.    . busPIRone (BUSPAR) 15 MG tablet Take 30 mg by mouth 2 (two) times daily.    . cariprazine (VRAYLAR) capsule Take 1.5 mg by mouth daily.    . fluticasone (FLONASE) 50 MCG/ACT nasal spray SPRAY 2 SPRAYS INTO EACH NOSTRIL EVERY DAY 48 mL 0  . Fluticasone-Umeclidin-Vilant (TRELEGY ELLIPTA) 200-62.5-25 MCG/INH AEPB INHALE 1 PUFF ONCE DAILY 60 each 1  . gabapentin (NEURONTIN) 300 MG capsule Take 300 mg by mouth at bedtime.  4  . lamoTRIgine (LAMICTAL) 200 MG tablet Take 200 mg by mouth 2 (two) times daily.    . magnesium oxide (MAG-OX) 400 MG tablet Take 400 mg by mouth daily.    . methocarbamol (ROBAXIN) 500 MG tablet Take 1 tablet (500 mg total) by mouth every 6 (six) hours as needed for muscle spasms. 60 tablet 0  . montelukast (SINGULAIR) 10 MG tablet TAKE 1 TABLET BY MOUTH EVERYDAY AT BEDTIME 90 tablet 1  . Multiple Vitamins-Minerals (MULTIVITAMIN WITH MINERALS) tablet Take 1 tablet by mouth daily.    . Omega-3 Fatty Acids (FISH OIL) 1000 MG CAPS Take 2,000 mg by mouth daily.    Marland Kitchen oxyCODONE (OXY IR/ROXICODONE) 5 MG immediate release tablet Take 1 tablet (5 mg total) by mouth every 4 (four) hours as needed for severe pain ((score 7 to 10)). 40 tablet 0  .  pantoprazole (PROTONIX) 40 MG tablet TAKE 1 TABLET BY MOUTH EVERY DAY (Patient taking differently: Take 40 mg by mouth daily.) 90 tablet 0  . sildenafil (VIAGRA) 50 MG tablet Take 50 mg by mouth as needed for erectile dysfunction.  1  . XIIDRA 5 % SOLN Place 1 drop into both eyes in the morning and at bedtime.  0  . zolpidem (AMBIEN) 10 MG tablet Take 5-10 mg by mouth at bedtime.      No current facility-administered medications for this visit.    Patient Active Problem List   Diagnosis Date Noted  . Mild neurocognitive disorder  due to multiple etiologies 03/20/2021  . S/P cervical spinal fusion 02/21/2021  . Fatigue 08/15/2020  . Physical deconditioning 08/15/2020  . History of COVID-19 08/15/2020  . COVID-19 virus infection 07/03/2020  . Moderate persistent asthma without complication 39/53/2023  . Non-allergic rhinitis 05/12/2020  . Lithium use 10/04/2019  . 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 Mild renal insufficiency 10/04/2019  . Frequent headaches 07/03/2016  . Elevated serum creatinine 05/02/2016  . Allergic rhinitis 02/17/2016  . Asthma with acute exacerbation 05/31/2015  . Bipolar disorder ([place]) 05/31/2015  . Gastroesophageal reflux disease 05/31/2015  . Asthma, mild persistent 04/27/2013  . Diverticulosis 09/02/2012  . H/O colonoscopy with polypectomy 09/02/2012  . Sleep disorder 12/04/2011    Mental Status and Behavioral Observations  Erik Ellis presented on time to the present encounter and was alert and generally oriented. Speech was normal in rate, rhythm, volume, and prosody. Self-reported mood was "better" and affect was nervous, with some minor psychomotor agitation. Thought process was logical and goal oriented albeit with a sticky quality, as at the last appointment and thought content was appropriate. There were no safety concerns identified at today's encounter, such as thoughts of harming self or others. I asked him directly about thoughts of self harm and he is not having any.   Plan  Feedback provided regarding the patient's neuropsychological evaluation. He has mild neurocognitive disorder, with lower test scores than might be expected, although I suspect they are being pulled down by validity issues and/or demographic correction artifact in addition to any actual cognitive problems. Erik Ellis was encouraged to contact me if any questions arise or if further follow up is desired. He can follow up in 1 year if he continues to have difficulties although I have the feeling his  cognitive problems will improve with stabilization of likely contributory issues.   Viviano Simas Nicole Kindred, PsyD, ABN Clinical Neuropsychologist  Service(s) Provided at This Encounter: 53 minutes [phone]; Psychotherapy with patient/family)

## 2021-03-20 NOTE — Patient Instructions (Signed)
Your performance and presentation on assessment today were consistent with performance validity problems. Just like a patient must lie still when undergoing an Xray for the picture to be interpretable, there are a set of assumptions that must be met for neuropsychological test findings to be of meaning. Your performance on validity measures suggests that we cannot be entirely sure that was the case. Validity problems can be caused by any number of things such as fatigue, difficulty maintaining attention, sensory impairments, idiosyncratic approaches to test taking, or misunderstanding test instructions. It is possible that some of your validity test failures are on the basis of the issues you are noticing day to day and thus, they are viewed as tempering expectations for your test findings as opposed to frankly invalidating them.  Beyond that, you had numerous low scores that I think can be best summed up as involving difficulties with executive control, cognitive efficiency/rapid processing of information, working memory, and executive function. You  also had low scores on measures of visuospatial function, the significance of which is not clear. Because you have a Masters level education, the expectation for your performance is quite high, and it is possible that your test data look worse than they otherwise would for this reason. 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   Mild neurocognitive disorder is a term for thinking and memory problems that are detectable on cognitive test but do not appreciably interfere with your functioning day to day. They may cause you to be less efficient, make errors, or otherwise get in the way, but you are still able to perform essential activities such as at work, in terms of managing finances and the like.  My best guess is that your mild neurocognitive disorder is due to your ongoing affective issues (I.e.,  "chemical imbalance" as you put it), distraction related to chronic pain, and interference from medications. Any of these factors on their own can contribute to cognitive inefficiency and problems, and they are likely that much more impactful when present together. My best advice would be for you to work on these ongoing issues, not overly focus on cognitive performance, and be patient with yourself. There may be some contribution from COVID-19, although the extent and presence of cognitive difficulties following COVID infection remains controversial.   Depression can affect cognitive functioning in several ways. For one, there are neurobiological changes in depression that can contribute to attention, concentration, and memory problems. These changes are so common they are part of the criteria we use to diagnose depressive disorders. Depression can also negatively impact your own appraisal of your cognitive abilities, leading you to feel like you are performing more poorly than is objectively warranted. You are already following with Dr. Jimmye Norman and may also benefit from psychotherapy, which you could look into. Psychotherapy can be particularly helpful when combined with medication management in bipolar disorder.   You have follow up with endocrinology related to your low TSH. Thyroid issues can affect cognitive efficiency among other things.   You should know that some of the medications you are on (specifically, Vraylar, Lamotrigine, and Neurontin) can contribute to memory and thinking problems. This is not to say they should be avoided, these medications may be needed for stabilization of the underlying conditions they are prescribed to treat, although you should know that may be part of your problem.   Avoid overfocusing on cognitive performance. Memory and cognition are notoriously fallible and if you are  looking for cognitive problems, you are bound to find them. Once someone gets worried about  their memory and thinking, they may overfocus on how they are doing day-to-day, and then when normal day-to-day cognitive errors are made, this becomes a cause for more concern. This concern and anxiety then decreases focus from the task at hand, reducing concentration, causing more cognitive problems, and creating a vicious cycle. Rather than critiquing your performance, I would encourage you to remain present minded and focus on the task at hand. Perhaps most importantly, have reasonable expectations for yourself.  Healthy people forget things, lose focus, and do not perform 100% correctly all the time. Some cognitive errors are normal and are not necessarily a sign that there is something wrong with your brain.   There are few things as disruptive to brain functioning as not getting a good night's sleep. For sleep, I recommend against using medications, which can have lingering sedating effects on the brain and rob your brain of restful REM sleep. Instead, consider trying some of the following sleep hygiene recommendations. They may not work at once and may take effort, but the effort you spend is likely to be rewarded with better sleep eventually:  . Stick to a sleep schedule of the same bedtime and wake time even on the weekends, which can help to regulate your body's internal clock so that you fall asleep and stay asleep.  . Practice a relaxing bedtime ritual (conducted away from bright lights) which will help separate your sleep from stimulating activities and prepare your body to fall asleep when you go to bed.  . Avoid naps, especially in the afternoon.  . Evaluate your room and create conditions that will promote sleep such as keeping it cool (between 60 - 67 degrees), quiet, and free from any lights. Consider using blackout curtains, a "white noise" generator, or fan that will help mask any noises that might prevent you from going to sleep or awaken you during the night.  . Sleep on a comfortable  mattress and pillows.  . Avoid bright light in the evening and excessive use of portable electronic devices right before bed that may contain light frequencies that can contribute to sleep problems.  . Avoid alcohol, cigarettes, or heavy meals in the evening. If you must eat, consume a light snack 45 minutes before bed.  . Use your bed only for sleep to strengthen the association between your bed and sleep.  . If you can't go to sleep within 30 minutes, go into another room and do something relaxing until you feel tired. Then, come back and try to go to sleep again for 30 minutes and repeat until sleep is achieved.  . Some people find over the counter melatonin to be helpful for sleep, which you could discuss with a pharmacist or prescribing provider.   Finally, I do not think you likely have a condition that is at risk for further decline, such as a developing dementia syndrome, although if you feel that your issues are worsening or fail to improve, you can certainly return for reevaluation in 1 to 2 years.

## 2021-03-31 ENCOUNTER — Other Ambulatory Visit: Payer: Self-pay | Admitting: Family Medicine

## 2021-04-03 ENCOUNTER — Ambulatory Visit: Payer: BC Managed Care – PPO | Admitting: Endocrinology

## 2021-05-13 NOTE — Progress Notes (Signed)
Patient ID: Erik Ellis, male   DOB: 1966-02-17, 55 y.o.   MRN: [id number]           Referring physician: Laroy Apple  Chief complaint: Abnormal thyroid test  History of Present Illness:  He was being seen by the neurologist in 3/22 for word finding difficulty and sleep disturbance and routine thyroid levels were checked TSH was low normal at 0.36, previous TSH was normal in 08/2020 Follow-up was done by the PCP twice subsequently and TSH levels have been again below normal at about 0.25 Has normal T4 and T3 levels  He does not complain of any palpitations, shakiness, nervousness, heat intolerance or sweating No recent weight loss or change in appetite He did have significant symptoms of depression after his cervical surgery in an late April and more recently has been on lithium again which had been stopped previously    Wt Readings from Last 3 Encounters:  05/14/21 175 lb 12.8 oz (79.7 kg)  03/07/21 168 lb 12.8 oz (76.6 kg)  02/21/21 169 lb 15.6 oz (77.1 kg)    Lab Results  Component Value Date   TSH 0.259 (L) 03/07/2021   TSH 0.244 (L) 02/01/2021   TSH 0.357 (L) 01/17/2021   FREET4 1.12 03/07/2021   FREET4 1.48 02/01/2021   Free T3 levels 2.9 and 3.0 respectively  Past Medical History:  Diagnosis Date   Asthma     Past Surgical History:  Procedure Laterality Date   CHOLECYSTECTOMY     KNEE ARTHROSCOPY     LUMBAR DISC SURGERY     NECK SURGERY     POSTERIOR CERVICAL FUSION/FORAMINOTOMY N/A 02/21/2021   Procedure: Posterior cervical fusion with lateral mass fixation - Cervical Ficve-Six/Cervical Six-Seven;  Surgeon: Tia Alert, MD;  Location: [facility] OR;  Service: Neurosurgery;  Laterality: N/A;   REPLACEMENT TOTAL KNEE      Family History  Problem Relation Age of Onset   Asthma Father    Allergic rhinitis Neg Hx    Angioedema Neg Hx    Atopy Neg Hx    Eczema Neg Hx    Immunodeficiency Neg Hx    Urticaria Neg Hx     Social History:  reports that he  has never smoked. He has never used smokeless tobacco. He reports previous alcohol use. He reports that he does not use drugs.  Allergies: No Known Allergies  Allergies as of 05/14/2021   No Known Allergies      Medication List        Accurate as of May 14, 2021  2:08 PM. If you have any questions, ask your nurse or doctor.          STOP taking these medications    oxyCODONE 5 MG immediate release tablet Commonly known as: Oxy IR/ROXICODONE Stopped by: Reather Littler, MD       TAKE these medications    albuterol 108 (90 Base) MCG/ACT inhaler Commonly known as: VENTOLIN HFA Inhale 2 puffs into the lungs every 4 (four) hours as needed. For shortness of breath/wheezing What changed: Another medication with the same name was removed. Continue taking this medication, and follow the directions you see here. Changed by: Reather Littler, MD   amLODipine 2.5 MG tablet Commonly known as: NORVASC Take 1 tablet (2.5 mg total) by mouth daily.   amphetamine-dextroamphetamine 10 MG tablet Commonly known as: ADDERALL Take 10 mg by mouth daily with breakfast.   buPROPion 300 MG 24 hr tablet Commonly known as: WELLBUTRIN XL Take  300 mg by mouth daily.   buPROPion 150 MG 24 hr tablet Commonly known as: WELLBUTRIN XL   busPIRone 15 MG tablet Commonly known as: BUSPAR Take 30 mg by mouth 2 (two) times daily.   cariprazine 1.5 MG capsule Commonly known as: VRAYLAR Take 1.5 mg by mouth daily.   Fasenra Pen 30 MG/ML Soaj Generic drug: Benralizumab INJECT 1 PEN UNDER THE SKIN  EVERY 8 WEEKS What changed:  how much to take how to take this when to take this   Fish Oil 1000 MG Caps Take 2,000 mg by mouth daily.   fluticasone 50 MCG/ACT nasal spray Commonly known as: FLONASE SPRAY 2 SPRAYS INTO EACH NOSTRIL EVERY DAY   gabapentin 300 MG capsule Commonly known as: NEURONTIN Take 300 mg by mouth at bedtime.   lamoTRIgine 200 MG tablet Commonly known as: LAMICTAL Take 200 mg  by mouth 2 (two) times daily.   magnesium oxide 400 MG tablet Commonly known as: MAG-OX Take 400 mg by mouth daily.   methocarbamol 500 MG tablet Commonly known as: ROBAXIN Take 1 tablet (500 mg total) by mouth every 6 (six) hours as needed for muscle spasms.   montelukast 10 MG tablet Commonly known as: SINGULAIR TAKE 1 TABLET BY MOUTH EVERYDAY AT BEDTIME   multivitamin with minerals tablet Take 1 tablet by mouth daily.   pantoprazole 40 MG tablet Commonly known as: PROTONIX TAKE 1 TABLET BY MOUTH EVERY DAY   sildenafil 50 MG tablet Commonly known as: VIAGRA Take 50 mg by mouth as needed for erectile dysfunction.   Trelegy Ellipta 200-62.5-25 MCG/INH Aepb Generic drug: Fluticasone-Umeclidin-Vilant INHALE 1 PUFF ONCE DAILY   vitamin C 1000 MG tablet Take 1,000 mg by mouth daily.   Xiidra 5 % Soln Generic drug: Lifitegrast Place 1 drop into both eyes in the morning and at bedtime.   zolpidem 10 MG tablet Commonly known as: AMBIEN Take 5-10 mg by mouth at bedtime.        LABS:  No visits with results within 1 Week(s) from this visit.  Latest known visit with results is:  Admission on 02/21/2021, Discharged on 02/22/2021  Component Date Value Ref Range Status   Sodium 02/21/2021 139  135 - 145 mmol/L Final   Potassium 02/21/2021 4.0  3.5 - 5.1 mmol/L Final   Chloride 02/21/2021 102  98 - 111 mmol/L Final   CO2 02/21/2021 32  22 - 32 mmol/L Final   Glucose, Bld 02/21/2021 99  70 - 99 mg/dL Final   Glucose reference range applies only to samples taken after fasting for at least 8 hours.   BUN 02/21/2021 25 (A) 6 - 20 mg/dL Final   Creatinine, Ser 02/21/2021 1.34 (A) 0.61 - 1.24 mg/dL Final   Calcium 17/49/4496 9.6  8.9 - 10.3 mg/dL Final   GFR, Estimated 02/21/2021 >60  >60 mL/min Final   Comment: (NOTE) Calculated using the CKD-EPI Creatinine Equation (2021)    Anion gap 02/21/2021 5  5 - 15 Final   Performed at [facility], [address],  [place], [place] [address]   WBC 02/21/2021 6.0  4.0 - 10.5 K/uL Final   RBC 02/21/2021 4.18 (A) 4.22 - 5.81 MIL/uL Final   Hemoglobin 02/21/2021 13.2  13.0 - 17.0 g/dL Final   HCT 38/46/6599 39.9  39.0 - 52.0 % Final   MCV 02/21/2021 95.5  80.0 - 100.0 fL Final   MCH 02/21/2021 31.6  26.0 - 34.0 pg Final   MCHC 02/21/2021  33.1  30.0 - 36.0 g/dL Final   RDW 16/10/960404/20/2022 12.2  11.5 - 15.5 % Final   Platelets 02/21/2021 244  150 - 400 K/uL Final   nRBC 02/21/2021 0.0  0.0 - 0.2 % Final   Neutrophils Relative % 02/21/2021 76  % Final   Neutro Abs 02/21/2021 4.5  1.7 - 7.7 K/uL Final   Lymphocytes Relative 02/21/2021 17  % Final   Lymphs Abs 02/21/2021 1.0  0.7 - 4.0 K/uL Final   Monocytes Relative 02/21/2021 7  % Final   Monocytes Absolute 02/21/2021 0.4  0.1 - 1.0 K/uL Final   Eosinophils Relative 02/21/2021 0  % Final   Eosinophils Absolute 02/21/2021 0.0  0.0 - 0.5 K/uL Final   Basophils Relative 02/21/2021 0  % Final   Basophils Absolute 02/21/2021 0.0  0.0 - 0.1 K/uL Final   Immature Granulocytes 02/21/2021 0  % Final   Abs Immature Granulocytes 02/21/2021 0.02  0.00 - 0.07 K/uL Final   Performed at [facility], [address] [place], [place] [address]   Prothrombin Time 02/21/2021 13.0  11.4 - 15.2 seconds Final   INR 02/21/2021 1.0  0.8 - 1.2 Final   Comment: (NOTE) INR goal varies based on device and disease states. Performed at [facility], [address] [place], [place] [address]    ABO/RH(D) 02/21/2021 O POS   Final   Antibody Screen 02/21/2021 NEG   Final   Sample Expiration 02/21/2021    Final                   Value:02/24/2021,2359 Performed at [facility], [address] [place], [place] [address]    MRSA, PCR 02/21/2021 NEGATIVE  NEGATIVE Final   Staphylococcus aureus 02/21/2021 NEGATIVE  NEGATIVE Final   Comment: (NOTE) The Xpert SA Assay (FDA approved for NASAL specimens in patients 57 years of age and older), is one component of a  comprehensive surveillance program. It is not intended to diagnose infection nor to guide or monitor treatment. Performed at [facility], [address] [place], [place] [address]    ABO/RH(D) 02/21/2021    Final                   Value:O POS Performed at [facility], [address] [place], [place] [address]         Review of Systems  Constitutional:  Negative for weight loss and weight gain.  HENT:  Negative for headaches.   Eyes:  Negative for visual disturbance.  Respiratory:  Negative for daytime sleepiness.   Cardiovascular:  Negative for leg swelling.  Gastrointestinal:  Negative for nausea.  Endocrine: Negative for fatigue, general weakness, heat intolerance and decreased libido.  Musculoskeletal:  Negative for joint pain.  Neurological:  Negative for numbness and tremors.  Psychiatric/Behavioral:  Negative for depressed mood.   He has been on amlodipine for high blood pressure since about 01/2021  BP Readings from Last 3 Encounters:  05/14/21 (!) 150/100  03/07/21 126/80  02/22/21 (!) 135/97    PHYSICAL EXAM:  BP (!) 150/100 (BP Location: Left Arm, Patient Position: Sitting, Cuff Size: Normal)   Pulse 78   Ht 5\' 9"  (1.753 m)   Wt 175 lb 12.8 oz (79.7 kg)   SpO2 98%   BMI 25.96 kg/m   GENERAL:   Averagely built and nourished  No pallor, clubbing, lymphadenopathy or edema.    Skin:  no  rash or pigmentation.  EYES:  Externally normal.  Fundii: Exam exam not indicated  ENT: Deferred exam because of need for masking  THYROID:  Not palpable.  No stridor  HEART:  Normal  S1 and S2; no murmur or click.  CHEST:  Normal shape.  Lungs: Vescicular breath sounds heard equally.   No crepitations/ wheeze.  ABDOMEN:  No distention.  Liver and spleen not palpable.  No other mass or tenderness.  NEUROLOGICAL: .Reflexes are bilaterally difficult to elicit at both biceps and at ankles, appear to be normal.  JOINTS:  Normal peripheral  joints.   ASSESSMENT:   Mildly decreased TSH level without increased or decreased free T4 or free T3 levelsFree T4 free T3 levels. Is also clinically asymptomatic, has no symptoms suggestive of hypothyroidism or hyperthyroidism. His exam indicates that he is euthyroid No evidence of thyroid enlargement or thyroid nodules  Likely that his abnormal TSH is related to intercurrent health problems including surgery in April, medication changes and unlikely to be related to primary thyroid disease or hypopituitarism    PLAN:   Recheck fasting thyroid panel and additional labs if needed If TSH is still low will add on free testosterone level as well Unlikely that thyroid scan will be needed since likely no treatment will be needed unless he has abnormally high free T4 or free T3 levels   Consultation note sent to the referring physician  Reather Littler 05/14/2021, 2:08 PM

## 2021-05-14 ENCOUNTER — Encounter: Payer: Self-pay | Admitting: Endocrinology

## 2021-05-14 ENCOUNTER — Other Ambulatory Visit: Payer: Self-pay

## 2021-05-14 ENCOUNTER — Ambulatory Visit (INDEPENDENT_AMBULATORY_CARE_PROVIDER_SITE_OTHER): Payer: BC Managed Care – PPO | Admitting: Endocrinology

## 2021-05-14 ENCOUNTER — Ambulatory Visit: Payer: BC Managed Care – PPO | Admitting: Endocrinology

## 2021-05-14 VITALS — BP 150/100 | HR 78 | Ht 69.0 in | Wt 175.8 lb

## 2021-05-14 DIAGNOSIS — Z79899 Other long term (current) drug therapy: Secondary | ICD-10-CM | POA: Diagnosis not present

## 2021-05-14 DIAGNOSIS — R7989 Other specified abnormal findings of blood chemistry: Secondary | ICD-10-CM

## 2021-05-14 DIAGNOSIS — R5383 Other fatigue: Secondary | ICD-10-CM | POA: Diagnosis not present

## 2021-05-15 ENCOUNTER — Other Ambulatory Visit (INDEPENDENT_AMBULATORY_CARE_PROVIDER_SITE_OTHER): Payer: BC Managed Care – PPO

## 2021-05-15 DIAGNOSIS — Z79899 Other long term (current) drug therapy: Secondary | ICD-10-CM | POA: Diagnosis not present

## 2021-05-15 DIAGNOSIS — R7989 Other specified abnormal findings of blood chemistry: Secondary | ICD-10-CM

## 2021-05-15 DIAGNOSIS — R5383 Other fatigue: Secondary | ICD-10-CM | POA: Diagnosis not present

## 2021-05-15 LAB — TSH: TSH: 0.53 u[IU]/mL (ref 0.35–5.50)

## 2021-05-15 LAB — T3, FREE: T3, Free: 3.5 pg/mL (ref 2.3–4.2)

## 2021-05-15 LAB — T4, FREE: Free T4: 0.83 ng/dL (ref 0.60–1.60)

## 2021-06-07 ENCOUNTER — Other Ambulatory Visit: Payer: Self-pay | Admitting: Allergy & Immunology

## 2021-06-21 ENCOUNTER — Other Ambulatory Visit: Payer: Self-pay | Admitting: Allergy & Immunology

## 2021-06-26 ENCOUNTER — Other Ambulatory Visit (HOSPITAL_COMMUNITY): Payer: Self-pay | Admitting: Student

## 2021-06-26 DIAGNOSIS — M5412 Radiculopathy, cervical region: Secondary | ICD-10-CM

## 2021-07-06 ENCOUNTER — Other Ambulatory Visit: Payer: Self-pay

## 2021-07-06 ENCOUNTER — Ambulatory Visit (HOSPITAL_COMMUNITY)
Admission: RE | Admit: 2021-07-06 | Discharge: 2021-07-06 | Disposition: A | Discharge status: Home / Self Care | Payer: BC Managed Care – PPO | Source: Ambulatory Visit | Attending: Student | Admitting: Student

## 2021-07-06 DIAGNOSIS — M5412 Radiculopathy, cervical region: Secondary | ICD-10-CM | POA: Diagnosis not present

## 2021-07-13 ENCOUNTER — Other Ambulatory Visit: Payer: Self-pay | Admitting: Family Medicine

## 2021-07-31 ENCOUNTER — Other Ambulatory Visit: Payer: Self-pay | Admitting: Family Medicine

## 2021-07-31 ENCOUNTER — Other Ambulatory Visit: Payer: Self-pay

## 2021-07-31 DIAGNOSIS — I10 Essential (primary) hypertension: Secondary | ICD-10-CM

## 2021-07-31 MED ORDER — AMLODIPINE BESYLATE 2.5 MG PO TABS: 2.5000 mg | ORAL_TABLET | Freq: Every day | ORAL | 0 refills | Status: DC

## 2021-08-01 ENCOUNTER — Ambulatory Visit: Payer: BC Managed Care – PPO | Admitting: Neurology

## 2021-08-05 ENCOUNTER — Other Ambulatory Visit: Payer: Self-pay | Admitting: Family Medicine

## 2021-08-05 DIAGNOSIS — I10 Essential (primary) hypertension: Secondary | ICD-10-CM

## 2021-08-15 NOTE — Telephone Encounter (Signed)
Sent my chart message 08/15/21 

## 2021-08-21 ENCOUNTER — Other Ambulatory Visit: Payer: Self-pay | Admitting: Allergy & Immunology

## 2021-09-03 IMAGING — DX DG CHEST 2V
2 series · 2 of 2 positions shown · non-contrast
Comparison: September 01, 2018

CLINICAL DATA: Cough.  Recent [id number] positive

EXAM:
CHEST - 2 VIEW

[dg chest 2 view (1 of 2)]
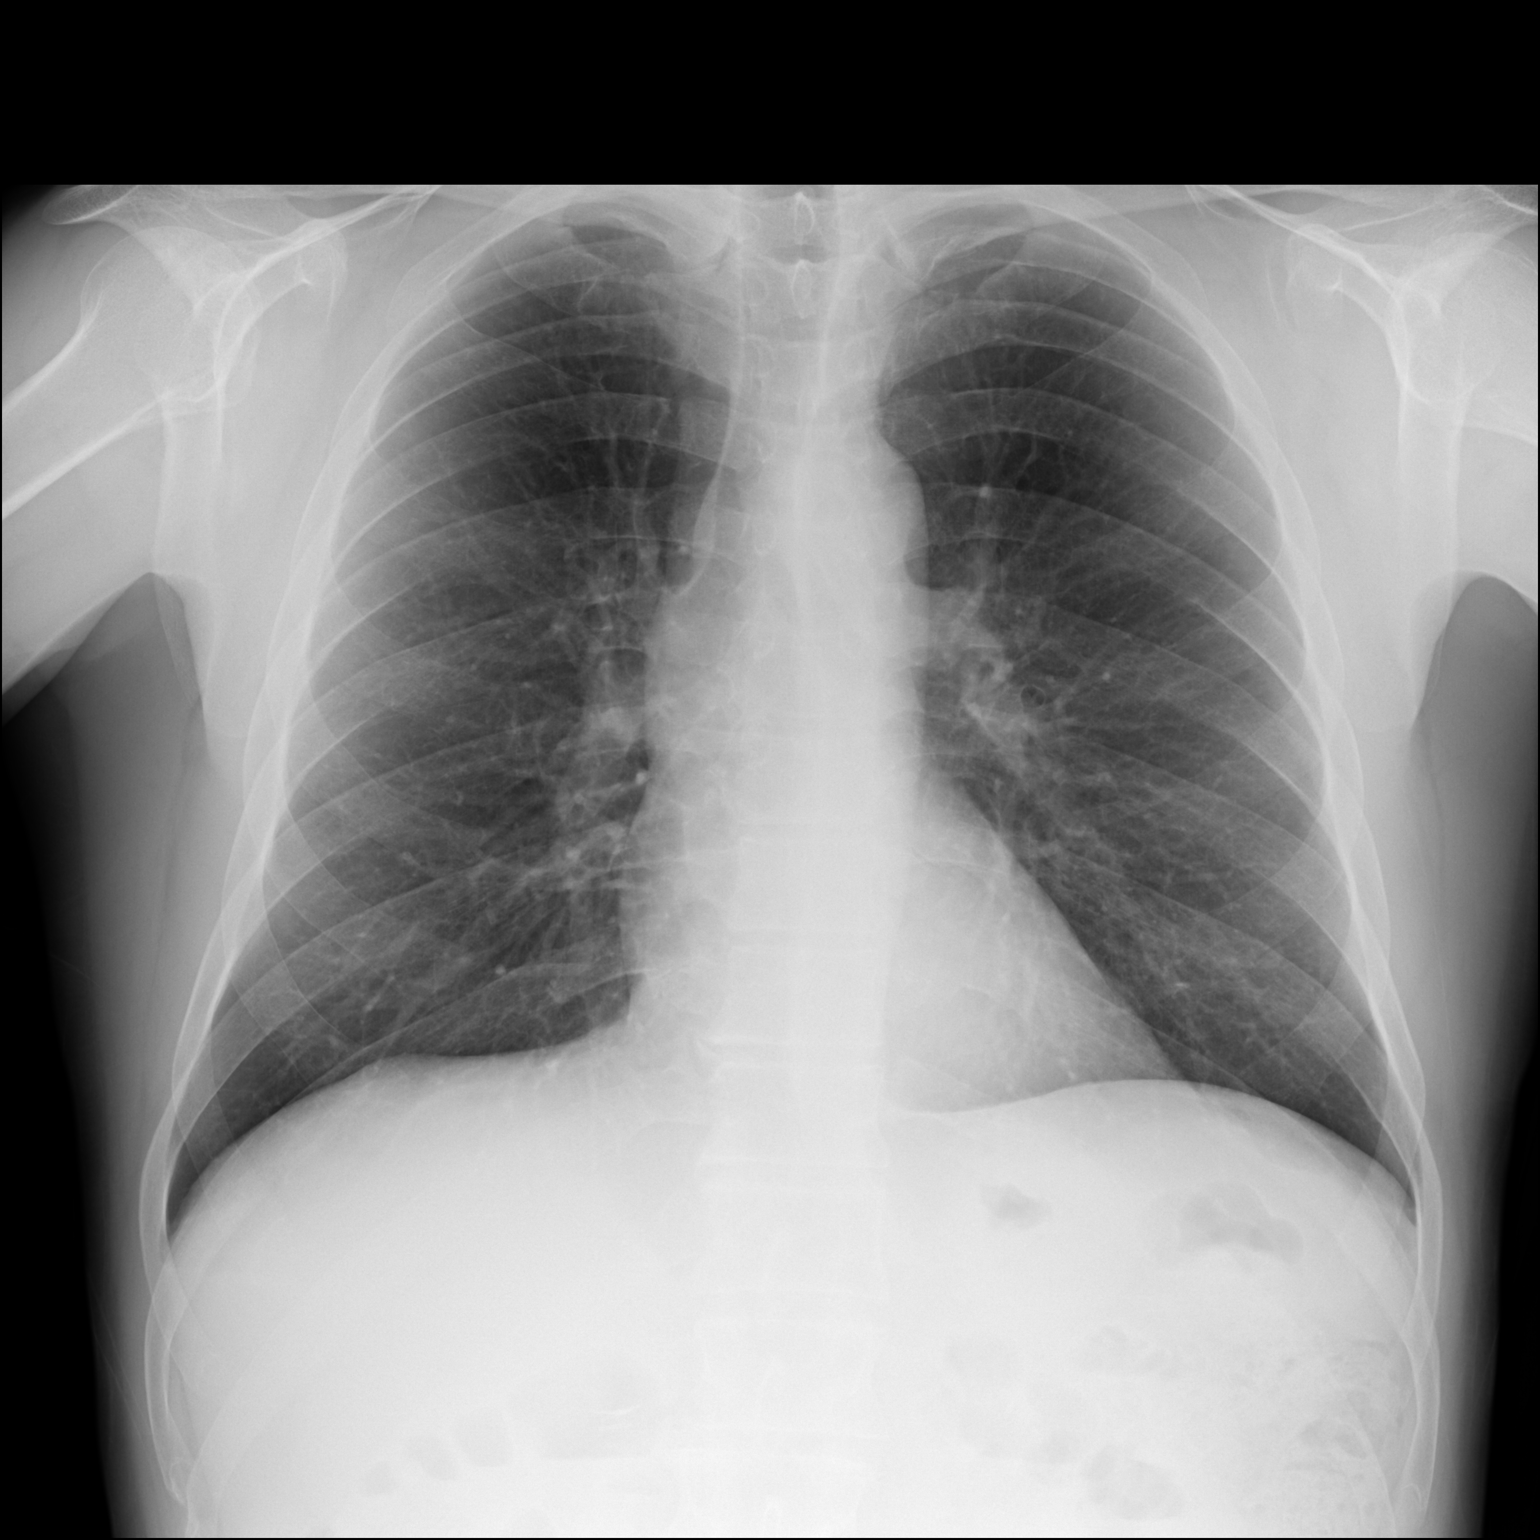

[dg chest 2 view (2 of 2)]
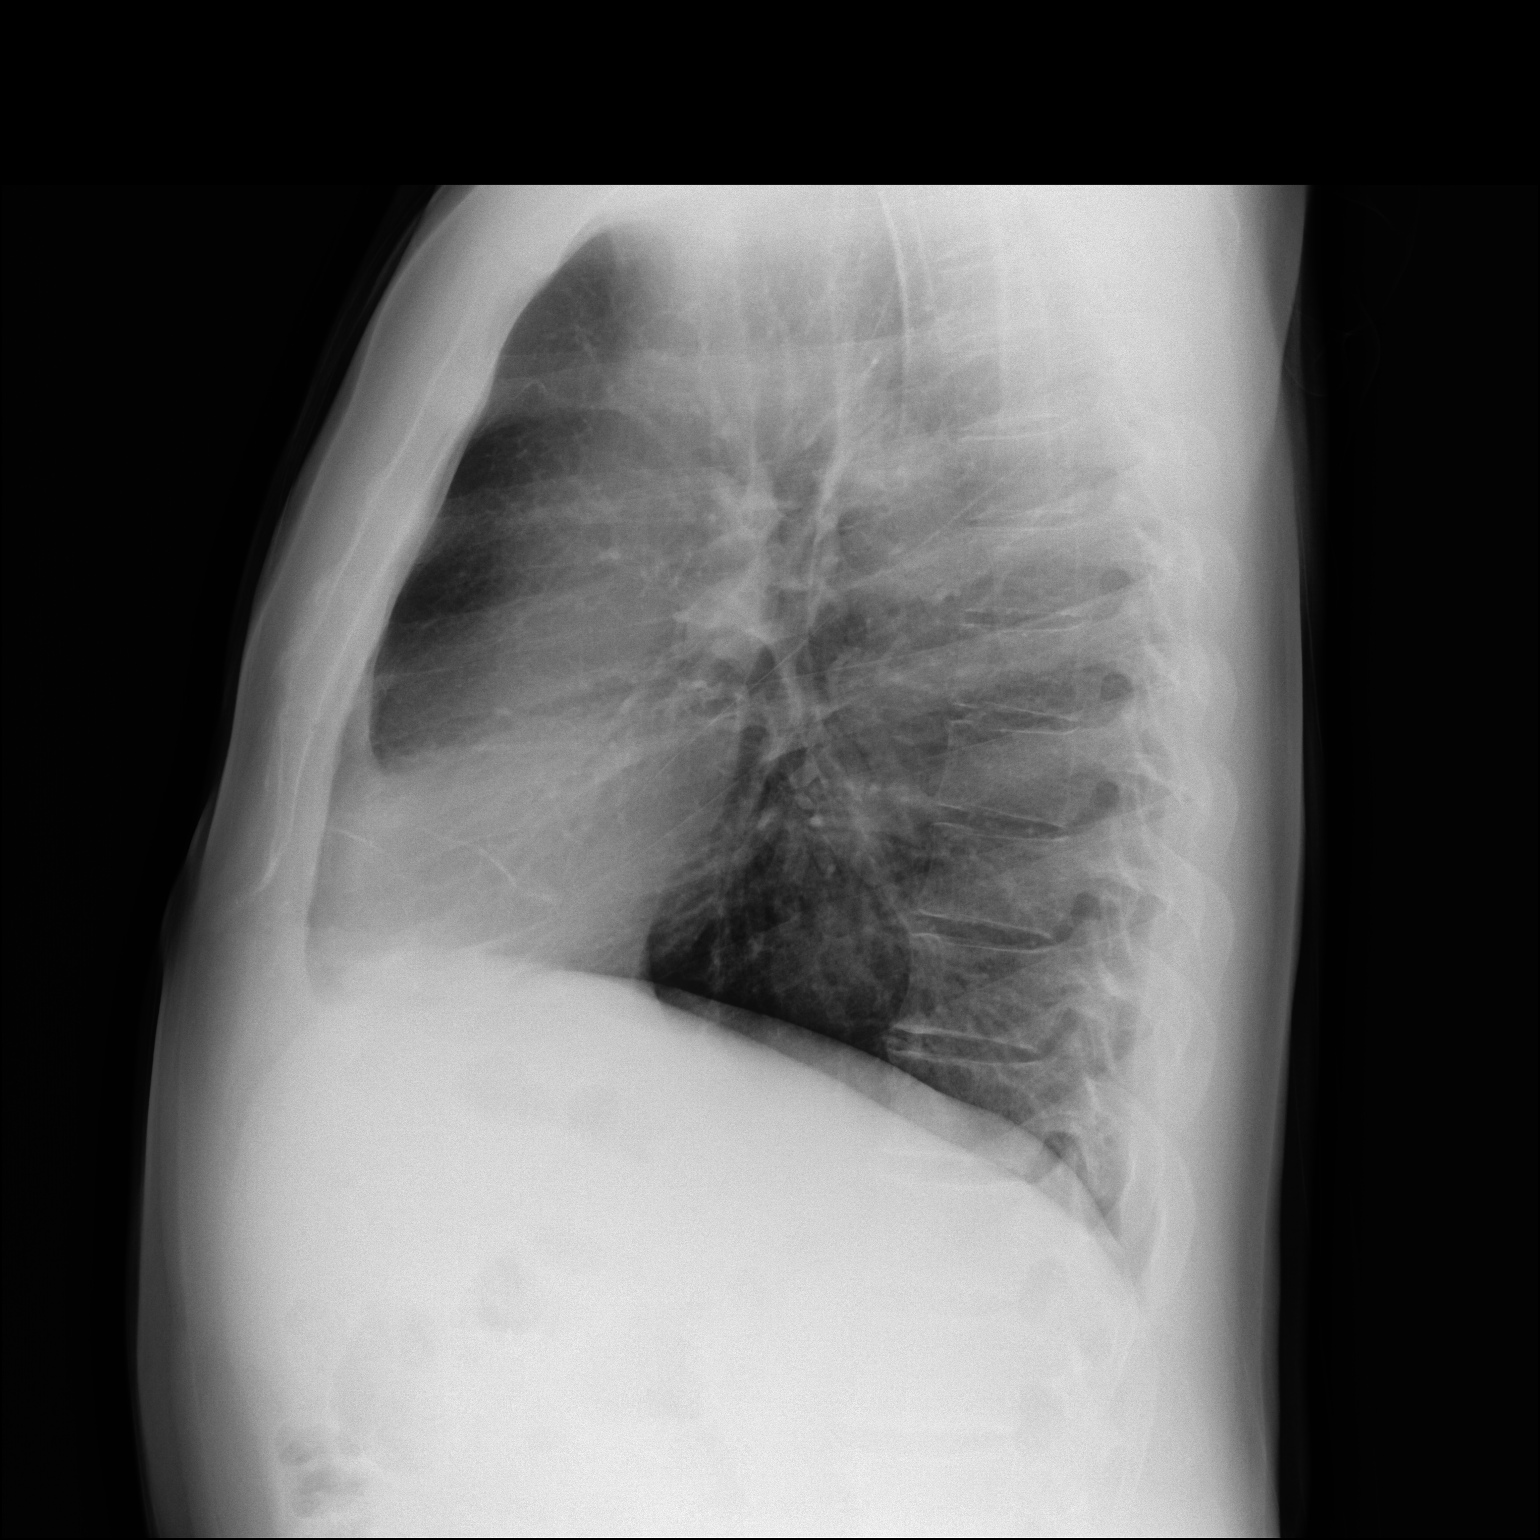

[2 of 2 positions shown; findings below may reference images not displayed]

FINDINGS: The lungs are clear. The heart size and pulmonary vascularity are
normal. No adenopathy. There is postoperative change in the lower
cervical region.
IMPRESSION: Lungs clear.  Cardiac silhouette normal.  No adenopathy.

## 2021-09-06 ENCOUNTER — Other Ambulatory Visit: Payer: Self-pay | Admitting: Allergy & Immunology

## 2021-09-11 ENCOUNTER — Telehealth: Payer: Self-pay | Admitting: Family Medicine

## 2021-09-11 DIAGNOSIS — I10 Essential (primary) hypertension: Secondary | ICD-10-CM

## 2021-09-11 MED ORDER — AMLODIPINE BESYLATE 2.5 MG PO TABS: 2.5000 mg | ORAL_TABLET | Freq: Every day | ORAL | 0 refills | Status: DC

## 2021-09-11 NOTE — Telephone Encounter (Signed)
Patient has appointment 11/21. Needs refill on blood pressure meds until visit.   [facility] [address] [place] [phone]   PT#  [phone]

## 2021-09-11 NOTE — Telephone Encounter (Signed)
Sent second request to schedule appointment 11/8/222

## 2021-09-11 NOTE — Telephone Encounter (Signed)
Refill sent to pharmacy and pt is aware. 

## 2021-09-24 ENCOUNTER — Other Ambulatory Visit: Payer: Self-pay

## 2021-09-24 ENCOUNTER — Ambulatory Visit: Payer: BC Managed Care – PPO | Admitting: Family Medicine

## 2021-09-24 ENCOUNTER — Encounter: Payer: Self-pay | Admitting: Family Medicine

## 2021-09-24 VITALS — BP 142/90 | HR 84 | Temp 97.4°F | Ht 69.0 in | Wt 179.8 lb

## 2021-09-24 DIAGNOSIS — R7989 Other specified abnormal findings of blood chemistry: Secondary | ICD-10-CM | POA: Diagnosis not present

## 2021-09-24 DIAGNOSIS — R739 Hyperglycemia, unspecified: Secondary | ICD-10-CM

## 2021-09-24 DIAGNOSIS — N289 Disorder of kidney and ureter, unspecified: Secondary | ICD-10-CM

## 2021-09-24 DIAGNOSIS — R051 Acute cough: Secondary | ICD-10-CM

## 2021-09-24 DIAGNOSIS — R059 Cough, unspecified: Secondary | ICD-10-CM | POA: Insufficient documentation

## 2021-09-24 DIAGNOSIS — I10 Essential (primary) hypertension: Secondary | ICD-10-CM

## 2021-09-24 DIAGNOSIS — E785 Hyperlipidemia, unspecified: Secondary | ICD-10-CM | POA: Diagnosis not present

## 2021-09-24 DIAGNOSIS — Z125 Encounter for screening for malignant neoplasm of prostate: Secondary | ICD-10-CM

## 2021-09-24 DIAGNOSIS — Z79899 Other long term (current) drug therapy: Secondary | ICD-10-CM

## 2021-09-24 MED ORDER — PREDNISONE 50 MG PO TABS: ORAL_TABLET | ORAL | 0 refills | Status: DC

## 2021-09-24 MED ORDER — AMLODIPINE BESYLATE 5 MG PO TABS: 5.0000 mg | ORAL_TABLET | Freq: Every day | ORAL | 3 refills | Status: DC

## 2021-09-24 NOTE — Progress Notes (Signed)
Subjective:  Patient ID: Erik Ellis, male    DOB: April 22, 1966  Age: 55 y.o. MRN: [id number]  CC: Chief Complaint  Patient presents with   Establish Care    Pt having dry cough and phlegm lately. Unsure if its asthma or a cold    HPI:  55 year old male with an extensive past medical history presents for follow-up.  Hypertension BP elevated today and was elevated on last visit as well.  He is currently on amlodipine 2.5 mg daily.  No reported side effects.  Hyperlipidemia Has not had a lipid panel since 2019.  Last LDL at that time was 140. He is not on any pharmacotherapy regarding this.  Cough Patient has known asthma and allergy.  He states that he has had cough over the past week.  Associated runny nose.  He states that his cough is dry at times and also is productive at other times.  He is concerned that this may be related to his asthma.  No fever.  No other respiratory symptoms.  Patient Active Problem List   Diagnosis Date Noted   Essential hypertension 09/24/2021   Hyperlipidemia 09/24/2021   Abnormal TSH 09/24/2021   Cough 09/24/2021   Mild neurocognitive disorder due to multiple etiologies 03/20/2021   S/P cervical spinal fusion 02/21/2021   Moderate persistent asthma without complication 78/67/6720   Mild renal insufficiency 10/04/2019   Allergic rhinitis 02/17/2016   Bipolar disorder ([place]) 05/31/2015   Gastroesophageal reflux disease 05/31/2015   H/O colonoscopy with polypectomy 09/02/2012   Sleep disorder 12/04/2011    Social Hx   Social History   Socioeconomic History   Marital status: Married    Spouse name: Not on file   Number of children: Not on file   Years of education: Not on file   Highest education level: Not on file  Occupational History   Not on file  Tobacco Use   Smoking status: Never   Smokeless tobacco: Never  Vaping Use   Vaping Use: Never used  Substance and Sexual Activity   Alcohol use: Not Currently   Drug use: No    Sexual activity: Not on file  Other Topics Concern   Not on file  Social History Narrative   Not on file   Social Determinants of Health   Financial Resource Strain: Not on file  Food Insecurity: Not on file  Transportation Needs: Not on file  Physical Activity: Not on file  Stress: Not on file  Social Connections: Not on file    Review of Systems Per HPI  Objective:  BP (!) 142/90   Pulse 84   Temp (!) 97.4 F (36.3 C)   Ht _0  (1.753 m)   Wt 179 lb 12.8 oz (81.6 kg)   SpO2 99%   BMI 26.55 kg/m   BP/Weight 09/24/2021 9/47/0962 06/06/6628  Systolic BP 476 546 503  Diastolic BP 90 546 80  Wt. (Lbs) 179.8 175.8 168.8  BMI 26.55 25.96 24.93    Physical Exam Vitals and nursing note reviewed.  Constitutional:      General: He is not in acute distress.    Appearance: Normal appearance. He is not ill-appearing.  HENT:     Head: Normocephalic and atraumatic.  Eyes:     General:        Right eye: No discharge.        Left eye: No discharge.     Conjunctiva/sclera: Conjunctivae normal.  Cardiovascular:     Rate and  Rhythm: Normal rate and regular rhythm.  Pulmonary:     Effort: Pulmonary effort is normal.     Breath sounds: Normal breath sounds. No wheezing or rales.  Neurological:     Mental Status: He is alert.    Lab Results  Component Value Date   WBC 6.0 02/21/2021   HGB 13.2 02/21/2021   HCT 39.9 02/21/2021   PLT 244 02/21/2021   GLUCOSE 99 02/21/2021   CHOL 213 (H) 04/14/2018   TRIG 115 04/14/2018   HDL 50 04/14/2018   LDLCALC 140 (H) 04/14/2018   ALT 16 01/17/2021   AST 16 01/17/2021   NA 139 02/21/2021   K 4.0 02/21/2021   CL 102 02/21/2021   CREATININE 1.34 (H) 02/21/2021   BUN 25 (H) 02/21/2021   CO2 32 02/21/2021   TSH 0.53 05/15/2021   INR 1.0 02/21/2021   HGBA1C 5.4 01/17/2021     Assessment & Plan:   Problem List Items Addressed This Visit       Cardiovascular and Mediastinum   Essential hypertension - Primary     Uncontrolled.  Increasing amlodipine to 5 mg daily.      Relevant Medications   amLODipine (NORVASC) 5 MG tablet   Other Relevant Orders   CMP14+EGFR     Genitourinary   Mild renal insufficiency    Noted on review of labs.  I suspect that the patient has chronic kidney disease.  Laboratory studies ordered.        Other   Hyperlipidemia    Uncontrolled.  This has not recently been addressed.  Laboratory studies ordered.  Once laboratory studies have returned, we will discuss treatment options.      Relevant Medications   amLODipine (NORVASC) 5 MG tablet   Other Relevant Orders   Lipid panel   Cough    Benign exam.  Asthma may be playing a role.  Prednisone as directed.  Supportive care.       Abnormal TSH    History of abnormal thyroid studies.  Possibly related to lithium use.  TSH ordered.      Relevant Orders   TSH   Other Visit Diagnoses     Hyperglycemia       Relevant Orders   Hemoglobin A1c   High risk medication use       Relevant Orders   CBC   Screening PSA (prostate specific antigen)       Relevant Orders   PSA       Meds ordered this encounter  Medications   amLODipine (NORVASC) 5 MG tablet    Sig: Take 1 tablet (5 mg total) by mouth daily.    Dispense:  90 tablet    Refill:  3    I10 Please schedule follow up with provider   predniSONE (DELTASONE) 50 MG tablet    Sig: 1 tablet daily x 5 days    Dispense:  5 tablet    Refill:  0    Follow-up:  Return in about 6 months (around 03/24/2022) for HTN follow up.  [place]

## 2021-09-24 NOTE — Assessment & Plan Note (Signed)
Uncontrolled.  This has not recently been addressed.  Laboratory studies ordered.  Once laboratory studies have returned, we will discuss treatment options.

## 2021-09-24 NOTE — Assessment & Plan Note (Signed)
History of abnormal thyroid studies.  Possibly related to lithium use.  TSH ordered.

## 2021-09-24 NOTE — Assessment & Plan Note (Signed)
Uncontrolled.  Increasing amlodipine to 5 mg daily.

## 2021-09-24 NOTE — Assessment & Plan Note (Signed)
Noted on review of labs.  I suspect that the patient has chronic kidney disease.  Laboratory studies ordered.

## 2021-09-24 NOTE — Assessment & Plan Note (Signed)
Benign exam.  Asthma may be playing a role.  Prednisone as directed.  Supportive care.

## 2021-09-24 NOTE — Patient Instructions (Signed)
Medication as prescribed - Prednisone if needed. I increase Amlodipine to 5 mg daily.  Follow up in 6 months.  Take care  Dr. Adriana Simas

## 2021-09-24 NOTE — Telephone Encounter (Signed)
Had appointment 09/24/21

## 2021-09-26 ENCOUNTER — Other Ambulatory Visit: Payer: Self-pay | Admitting: *Deleted

## 2021-09-26 ENCOUNTER — Telehealth: Payer: Self-pay | Admitting: Allergy & Immunology

## 2021-09-26 MED ORDER — TRELEGY ELLIPTA 200-62.5-25 MCG/ACT IN AEPB: 1.0000 | INHALATION_SPRAY | Freq: Every day | RESPIRATORY_TRACT | 5 refills | Status: DC

## 2021-09-26 NOTE — Telephone Encounter (Signed)
Prescription has been sent in. Attempted to call patient but received a busy tone. Will need to call again.

## 2021-09-26 NOTE — Telephone Encounter (Signed)
Spoke with patient and informed. Patient verbalized understanding. °

## 2021-09-26 NOTE — Telephone Encounter (Signed)
Patient called and needs a refill on the trelegy ellipta called into [facility] on [address]. Has appointment on 10/03/[phone].

## 2021-10-03 ENCOUNTER — Ambulatory Visit: Payer: BC Managed Care – PPO | Admitting: Allergy & Immunology

## 2021-10-03 ENCOUNTER — Other Ambulatory Visit: Payer: Self-pay

## 2021-10-03 ENCOUNTER — Encounter: Payer: Self-pay | Admitting: Allergy & Immunology

## 2021-10-03 VITALS — BP 140/72 | HR 87 | Temp 97.9°F | Resp 16

## 2021-10-03 DIAGNOSIS — J4540 Moderate persistent asthma, uncomplicated: Secondary | ICD-10-CM | POA: Diagnosis not present

## 2021-10-03 DIAGNOSIS — K219 Gastro-esophageal reflux disease without esophagitis: Secondary | ICD-10-CM

## 2021-10-03 DIAGNOSIS — J310 Chronic rhinitis: Secondary | ICD-10-CM

## 2021-10-03 NOTE — Patient Instructions (Addendum)
1. Moderate persistent asthma without complication - Lung function looks great today.  - Prednisone pack provided today to help knock out the remaining asthma symptom from your recent exacerbation.  - Daily controller medication(s): Singulair 10mg  daily and Trelegy 200/62.5/25 one puff once daily and Fasenra (every 8 weeks) - Prior to physical activity: ProAir 2 puffs 10-15 minutes before physical activity. - Rescue medications: ProAir 4 puffs every 4-6 hours as needed - Asthma control goals:  * Full participation in all desired activities (may need albuterol before activity) * Albuterol use two time or less a week on average (not counting use with activity) * Cough interfering with sleep two time or less a month * Oral steroids no more than once a year * No hospitalizations  2. Non-allergic rhinitis - We will not make any medication changes at all.  - Continue with: Flonase (fluticasone) one spray per nostril daily   3. Return in about 6 months (around 04/02/2022).    Please inform 04/04/2022 of any Emergency Department visits, hospitalizations, or changes in symptoms. Call us before going to the ED for breathing or allergy symptoms since we might be able to fit you in for a sick visit. Feel free to contact us anytime with any questions, problems, or concerns.  It was a pleasure to see you again today!  Websites that have reliable patient information: 1. American Academy of Asthma, Allergy, and Immunology: www.aaaai.org 2. Food Allergy Research and Education (FARE): foodallergy.org 3. Mothers of Asthmatics: http://www.asthmacommunitynetwork.org 4. American College of Allergy, Asthma, and Immunology: www.acaai.org   COVID-19 Vaccine Information can be found at: [place] For questions related to vaccine distribution or appointments, please email [email or web address] or call [phone].   We realize that you might be  concerned about having an allergic reaction to the COVID19 vaccines. To help with that concern, WE ARE OFFERING THE COVID19 VACCINES IN OUR OFFICE! Ask the front desk for dates!     "Like" [phone] on Facebook and Instagram for our latest updates!      A healthy democracy works best when [place] participate! Make sure you are registered to vote! If you have moved or changed any of your contact information, you will need to get this updated before voting!  In some cases, you MAY be able to register to vote online: [employer]

## 2021-10-03 NOTE — Progress Notes (Signed)
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 Moderate persistent asthma without complication - Lung function looks great today.  - Prednisone pack provided today to help knock out the remaining asthma symptom from your recent exacerbation.  - Daily controller medication(s): Singulair 10mg  daily and Trelegy 200/62.5/25 one puff once daily and Fasenra (every 8 weeks) - Prior to physical activity: ProAir 2 puffs 10-15 minutes before physical activity. - Rescue medications: ProAir 4 puffs every 4-6 hours as needed - Asthma control goals:  * Full participation in all desired activities (may need albuterol before activity) * Albuterol use two time or less a week on average (not counting use with activity) * Cough interfering with sleep two time or less a month * Oral steroids no more than once a year * No hospitalizations  2. Non-allergic rhinitis - We will not make any medication changes at all.  - Continue with: Flonase (fluticasone) one spray per nostril daily   3. Return in about 6 months (around 04/02/2022).     Subjective:   Erik Ellis is a 55 y.o. 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  Erik Ellis is a 55 y.o. male presenting for a follow up visit.  He was last seen in May 2022.  At that time, he was doing fairly good.  His lung function looked excellent.  We gave her more samples of Trelegy to tide him over.  We continue with the Singulair as well as the Trelegy 200 mcg 1 puff once daily.  He was also on Fasenra every 8 weeks.  For his nonallergic rhinitis, would continue with Flonase 1 spray per nostril daily as needed.  Since last visit, he has done very well.  His last major flare of his asthma was around the time when he had COVID-19.  Asthma/Respiratory Symptom History: He developed coughing and SOB two weeks ago. He saw Dr. June 2022 and then it started calming down. It went dow nto bad but not calm but not quite gone. He finished the prednisone after five days but his symptoms have continued to worsen since stopping it.  He typically gets asthma attacks in the fall more than the spring. But typically is once per year.  He has an exacerbation, and tends to require 2-3 courses of subsequent steroid doses to resolve it.   Allergic Rhinitis Symptom History: He remains on the Flonase as needed.  He has not needed antibiotics since  last time I saw him.  Overall, symptoms are well controlled.  He has bipolar disease and is stable on the lithium. He and his family went to the [employer] over the summer and had a lovely time. They did not see horses.   Otherwise, there have been no changes to his past medical history, surgical history, family history, or social history.    Review of Systems  Constitutional: Negative.  Negative for fever, malaise/fatigue and weight loss.  HENT:  Negative.  Negative for congestion, ear discharge and ear pain.   Eyes:  Negative for pain, discharge and redness.  Respiratory:  Positive for cough, shortness of breath and wheezing. Negative for sputum production.   Cardiovascular: Negative.  Negative for chest pain and palpitations.  Gastrointestinal:  Negative for abdominal pain, constipation, diarrhea, heartburn, nausea and vomiting.  Skin: Negative.  Negative for itching and rash.  Neurological:  Negative for dizziness and headaches.  Endo/Heme/Allergies:  Negative for environmental allergies. Does not bruise/bleed easily.      Objective:   Blood pressure 140/72, pulse 87, temperature 97.9 F (36.6 C), temperature source Temporal, resp. rate 16, SpO2 95 %. There is no height or weight on file to calculate BMI.   Physical Exam:  Physical Exam Vitals reviewed.  Constitutional:      Appearance: He is well-developed.     Comments: Smiling. Very friendly.   HENT:     Head: Normocephalic and atraumatic.     Right Ear: Tympanic membrane, ear canal and external ear normal.     Left Ear: Tympanic membrane, ear canal and external ear normal.     Nose: No nasal deformity, septal deviation, mucosal edema or rhinorrhea.     Right Turbinates: Enlarged and swollen.     Left Turbinates: Enlarged and swollen.     Right Sinus: No maxillary sinus tenderness or frontal sinus tenderness.     Left Sinus: No maxillary sinus tenderness or frontal sinus tenderness.     Mouth/Throat:     Mouth: Mucous membranes are not pale and not dry.     Pharynx: Uvula midline.     Comments: Cobblestoning in the posterior oropharynx. Eyes:     General: Lids are normal. No allergic shiner.       Right eye: No discharge.        Left eye: No discharge.     Conjunctiva/sclera: Conjunctivae normal.     Right eye: Right conjunctiva is not injected. No chemosis.    Left eye: Left conjunctiva is not injected. No chemosis.    Pupils: Pupils are equal, round, and  reactive to light.  Cardiovascular:     Rate and Rhythm: Normal rate and regular rhythm.     Heart sounds: Normal heart sounds.  Pulmonary:     Effort: Pulmonary effort is normal. No tachypnea, accessory muscle usage or respiratory distress.     Breath sounds: Normal breath sounds. No wheezing, rhonchi or rales.     Comments: Decreased air movement at the bases. Overall sounds great. Chest:     Chest wall: No tenderness.  Lymphadenopathy:     Cervical: No cervical adenopathy.  Skin:    Coloration: Skin is not pale.     Findings: No abrasion, erythema, petechiae or rash. Rash is not papular, urticarial or vesicular.  Neurological:     Mental Status: He is alert.  Psychiatric:        Behavior: Behavior is cooperative.     Diagnostic studies:    Spirometry:  results abnormal (FEV1: 2.94/83%, FVC: 3.39/74%, FEV1/FVC: 87%).    Spirometry consistent with possible restrictive disease. Overall values are lower than those obtained at the last visit.   Allergy Studies: none      Malachi Bonds, MD  [facility]

## 2021-10-04 ENCOUNTER — Encounter: Payer: Self-pay | Admitting: Allergy & Immunology

## 2021-10-04 MED ORDER — MONTELUKAST SODIUM 10 MG PO TABS: ORAL_TABLET | ORAL | 1 refills | Status: DC

## 2021-10-04 MED ORDER — FLUTICASONE PROPIONATE 50 MCG/ACT NA SUSP: 1.0000 | Freq: Every day | NASAL | 1 refills | Status: DC

## 2021-10-04 MED ORDER — ALBUTEROL SULFATE HFA 108 (90 BASE) MCG/ACT IN AERS: 2.0000 | INHALATION_SPRAY | RESPIRATORY_TRACT | 1 refills | Status: DC | PRN

## 2021-10-04 MED ORDER — TRELEGY ELLIPTA 200-62.5-25 MCG/ACT IN AEPB: 1.0000 | INHALATION_SPRAY | Freq: Every day | RESPIRATORY_TRACT | 5 refills | Status: DC

## 2021-10-09 ENCOUNTER — Other Ambulatory Visit: Payer: Self-pay | Admitting: Family Medicine

## 2022-01-06 ENCOUNTER — Other Ambulatory Visit: Payer: Self-pay | Admitting: Family Medicine

## 2022-01-09 LAB — CMP14+EGFR
ALT: 15 IU/L (ref 0–44)
AST: 20 IU/L (ref 0–40)
Albumin/Globulin Ratio: 1.7 (ref 1.2–2.2)
Albumin: 4.6 g/dL (ref 3.8–4.9)
Alkaline Phosphatase: 129 IU/L — ABNORMAL HIGH (ref 44–121)
BUN/Creatinine Ratio: 18 (ref 9–20)
BUN: 26 mg/dL — ABNORMAL HIGH (ref 6–24)
Bilirubin Total: 0.4 mg/dL (ref 0.0–1.2)
CO2: 26 mmol/L (ref 20–29)
Calcium: 10.1 mg/dL (ref 8.7–10.2)
Chloride: 102 mmol/L (ref 96–106)
Creatinine, Ser: 1.45 mg/dL — ABNORMAL HIGH (ref 0.76–1.27)
Globulin, Total: 2.7 g/dL (ref 1.5–4.5)
Glucose: 94 mg/dL (ref 70–99)
Potassium: 4.6 mmol/L (ref 3.5–5.2)
Sodium: 142 mmol/L (ref 134–144)
Total Protein: 7.3 g/dL (ref 6.0–8.5)
eGFR: 56 mL/min/{1.73_m2} — ABNORMAL LOW (ref >59)

## 2022-01-09 LAB — LIPID PANEL
Chol/HDL Ratio: 4.9 ratio (ref 0.0–5.0)
Cholesterol, Total: 225 mg/dL — ABNORMAL HIGH (ref 100–199)
HDL: 46 mg/dL (ref >39)
LDL Chol Calc (NIH): 148 mg/dL — ABNORMAL HIGH (ref 0–99)
Triglycerides: 170 mg/dL — ABNORMAL HIGH (ref 0–149)
VLDL Cholesterol Cal: 31 mg/dL (ref 5–40)

## 2022-01-09 LAB — CBC
Hematocrit: 43.3 % (ref 37.5–51.0)
Hemoglobin: 14.2 g/dL (ref 13.0–17.7)
MCH: 30.9 pg (ref 26.6–33.0)
MCHC: 32.8 g/dL (ref 31.5–35.7)
MCV: 94 fL (ref 79–97)
Platelets: 272 10*3/uL (ref 150–450)
RBC: 4.6 x10E6/uL (ref 4.14–5.80)
RDW: 12.7 % (ref 11.6–15.4)
WBC: 5.4 10*3/uL (ref 3.4–10.8)

## 2022-01-09 LAB — PSA: Prostate Specific Ag, Serum: 1.4 ng/mL (ref 0.0–4.0)

## 2022-01-09 LAB — HEMOGLOBIN A1C
Est. average glucose Bld gHb Est-mCnc: 108 mg/dL
Hgb A1c MFr Bld: 5.4 % (ref 4.8–5.6)

## 2022-01-09 LAB — TSH: TSH: 0.529 u[IU]/mL (ref 0.450–4.500)

## 2022-01-11 NOTE — Addendum Note (Signed)
Addended by: Margaretha Sheffield on: 01/11/2022 10:17 AM   Modules accepted: Orders

## 2022-01-16 ENCOUNTER — Other Ambulatory Visit: Payer: Self-pay | Admitting: Family Medicine

## 2022-01-16 MED ORDER — ATORVASTATIN CALCIUM 20 MG PO TABS: 20.0000 mg | ORAL_TABLET | Freq: Every day | ORAL | 3 refills | Status: DC

## 2022-01-30 ENCOUNTER — Encounter: Payer: Self-pay | Admitting: Family Medicine

## 2022-03-04 ENCOUNTER — Encounter: Payer: Self-pay | Admitting: Allergy & Immunology

## 2022-03-05 ENCOUNTER — Ambulatory Visit: Payer: BC Managed Care – PPO | Admitting: Allergy & Immunology

## 2022-03-05 ENCOUNTER — Encounter: Payer: Self-pay | Admitting: Allergy & Immunology

## 2022-03-05 VITALS — BP 154/90 | HR 97 | Temp 98.5°F | Resp 20 | Ht 68.5 in | Wt 171.6 lb

## 2022-03-05 DIAGNOSIS — K219 Gastro-esophageal reflux disease without esophagitis: Secondary | ICD-10-CM | POA: Diagnosis not present

## 2022-03-05 DIAGNOSIS — J4540 Moderate persistent asthma, uncomplicated: Secondary | ICD-10-CM | POA: Diagnosis not present

## 2022-03-05 DIAGNOSIS — J310 Chronic rhinitis: Secondary | ICD-10-CM

## 2022-03-05 MED ORDER — PREDNISONE 10 MG PO TABS: ORAL_TABLET | ORAL | 0 refills | Status: DC

## 2022-03-05 NOTE — Progress Notes (Signed)
? ?FOLLOW UP ? ?Date of Service/Encounter:  03/05/22 ? ? ?Assessment:  ? ?Moderate persistent asthma without complication - alpha-1 antitrpysin and Aspergillus precipitins all normal ?  ?Non-allergic rhinitis ?  ?COVID19 long hauler ?  ?Bipolar disorder - on lithium  ?  ?Chronic neck pain with surgery ten years ago and now withh DDD ? ?Plan/Recommendations:  ? ?1. Moderate persistent asthma without complication ?- Lung function looks great today.  ?- We are going to give you a prolonged prednisone burst. ?- We might change to [employer] (info provided). ?- Spacer demonstration and sample provided.  ?- Daily controller medication(s): Singulair 10mg  daily and Trelegy 200/62.5/25 one puff once daily and Fasenra (every 8 weeks) ?- Prior to physical activity: ProAir 2 puffs 10-15 minutes before physical activity. ?- Rescue medications: ProAir 4 puffs every 4-6 hours as needed ?- Asthma control goals:  ?* Full participation in all desired activities (may need albuterol before activity) ?* Albuterol use two time or less a week on average (not counting use with activity) ?* Cough interfering with sleep two time or less a month ?* Oral steroids no more than once a year ?* No hospitalizations ? ?2. Non-allergic rhinitis ?- We will not make any medication changes at all.  ?- Continue with: Flonase (fluticasone) one spray per nostril daily  ? ?3. Return in about 4 months (around 07/06/2022).  ? ?Subjective:  ? ?Erik Ellis is a 56 y.o. male presenting today for follow up of  ?Chief Complaint  ?Patient presents with  ? Asthma  ?  Had an asthma attack: SOB, trouble breathing, coughing. Used inhaler but he was still coughing. When he is prone he has more issues with a cough and SOB. ?Patient states that will he normally gets like this 1-2 rounds of prednisone usually clears it up.  ? ? ?Erik Ellis has a history of the following: ?Patient Active Problem List  ? Diagnosis Date Noted  ? Essential hypertension 09/24/2021  ?  Hyperlipidemia 09/24/2021  ? Abnormal TSH 09/24/2021  ? Cough 09/24/2021  ? Mild neurocognitive disorder due to multiple etiologies 03/20/2021  ? S/P cervical spinal fusion 02/21/2021  ? Moderate persistent asthma without complication [id number]  ? Mild renal insufficiency 10/04/2019  ? Allergic rhinitis 02/17/2016  ? Bipolar disorder (Erik Ellis) 05/31/2015  ? Gastroesophageal reflux disease 05/31/2015  ? H/O colonoscopy with polypectomy 09/02/2012  ? Sleep disorder 12/04/2011  ? ? ?History obtained from: chart review and patient. ? ?Erik Ellis is a 56 y.o. male presenting for a follow up visit.  He was last seen in November 2022.  At that time, his lung function looked great.  We did give him a prednisone pack because he was dealing with the end of an asthma exacerbation.  We gave it to him just in case it got worse.  He continued with Singulair 10 mg daily and Trelegy 200 mcg 1 puff once daily as well as Fasenra every 8 weeks.  We also continue with albuterol as needed.  For his nonallergic rhinitis, he was doing well with Flonase 1 spray per nostril daily. ? ?Since last visit, he has done fairly well. ? ?Asthma/Respiratory Symptom History: He has  been on the Trelegy. He does fairly well with his Trelegy. He has not had prednisone since November. He does not think that the Berna Bue is doing much, but he also make some references make me think that he has not been using it.  He denies that needles bother him, he just has  not seen the Berna Bue has been much used.  We changed to home administration a while ago, so it has been hard to track how often he gets it.  Regardless, he has not needed prednisone at all since November which is around 5 to 6 months.  This is fairly good for him.  He went for period of time needing prednisone every month or more when he had COVID-19. ? ?Most recently, he has been short of breath and coughing for around 5 days.  He has been using his rescue inhaler multiple times a day.  This especially  gets bad towards the end of the day.  He has been spending more time outdoors since practice started.  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  He denies fevers.  He denies chest pain. ? ?Allergic Rhinitis Symptom History: His rhinitis symptoms are controlled with the Flonase.  He has not needed antibiotics at all. ? ?Otherwise, there have been no changes to his past medical history, surgical history, family history, or social history. ? ? ? ?Review of Systems  ?Constitutional: Negative.  Negative for chills, fever, malaise/fatigue and weight loss.  ?HENT:  Negative for congestion, ear discharge, ear pain and sinus pain.   ?Eyes:  Negative for pain, discharge and redness.  ?Respiratory:  Positive for cough, shortness of breath and wheezing. Negative for sputum production.   ?Cardiovascular: Negative.  Negative for chest pain and palpitations.  ?Gastrointestinal:  Negative for abdominal pain, constipation, diarrhea, heartburn, nausea and vomiting.  ?Skin: Negative.  Negative for itching and rash.  ?Neurological:  Negative for dizziness and headaches.  ?Endo/Heme/Allergies:  Positive for environmental allergies. Does not bruise/bleed easily.   ? ? ? ?Objective:  ? ?Blood pressure (!) 154/90, pulse 97, temperature 98.5 ?F (36.9 ?C), temperature source Temporal, resp. rate 20, height 5' 8.5" (1.74 m), weight 171 lb 9.6 oz (77.8 kg), SpO2 99 %. ?Body mass index is 25.71 kg/m?. ? ? ? ?Physical Exam ?Vitals reviewed.  ?Constitutional:   ?   Appearance: He is well-developed.  ?   Comments: Smiling. Very friendly.   ?HENT:  ?   Head: Normocephalic and atraumatic.  ?   Right Ear: Tympanic membrane, ear canal and external ear normal.  ?   Left Ear: Tympanic membrane, ear canal and external ear normal.  ?   Nose: No nasal deformity, septal deviation, mucosal edema or rhinorrhea.  ?   Right Turbinates: Enlarged and swollen.  ?   Left Turbinates: Enlarged and swollen.  ?   Right Sinus: No maxillary sinus tenderness or  frontal sinus tenderness.  ?   Left Sinus: No maxillary sinus tenderness or frontal sinus tenderness.  ?   Mouth/Throat:  ?   Mouth: Mucous membranes are not pale and not dry.  ?   Pharynx: Uvula midline.  ?   Comments: Cobblestoning in the posterior oropharynx. ?Eyes:  ?   General: Lids are normal. No allergic shiner.    ?   Right eye: No discharge.     ?   Left eye: No discharge.  ?   Conjunctiva/sclera: Conjunctivae normal.  ?   Right eye: Right conjunctiva is not injected. No chemosis. ?   Left eye: Left conjunctiva is not injected. No chemosis. ?   Pupils: Pupils are equal, round, and reactive to light.  ?Cardiovascular:  ?   Rate and Rhythm: Normal rate and regular rhythm.  ?   Heart sounds: Normal heart sounds.  ?Pulmonary:  ?  Effort: Pulmonary effort is normal. No tachypnea, accessory muscle usage or respiratory distress.  ?   Breath sounds: Examination of the right-upper field reveals wheezing and rhonchi. Examination of the left-upper field reveals wheezing and rhonchi. Examination of the right-middle field reveals rhonchi. Examination of the left-middle field reveals rhonchi. Examination of the right-lower field reveals rhonchi. Examination of the left-lower field reveals rhonchi. Wheezing and rhonchi present. No rales.  ?   Comments: Decreased air movement at the bases. ?Chest:  ?   Chest wall: No tenderness.  ?Lymphadenopathy:  ?   Cervical: No cervical adenopathy.  ?Skin: ?   Coloration: Skin is not pale.  ?   Findings: No abrasion, erythema, petechiae or rash. Rash is not papular, urticarial or vesicular.  ?Neurological:  ?   Mental Status: He is alert.  ?Psychiatric:     ?   Behavior: Behavior is cooperative.  ?  ? ?Diagnostic studies:   ? ?Spirometry: results normal (FEV1: 3.21/93%, FVC: 3.74/85%, FEV1/FVC: 86%).  ?  ?Spirometry consistent with normal pattern.  ? ? ?Allergy Studies:  none ? ? ? ? ? ?  ?Salvatore Marvel, MD  ?[facility] ? ? ? ? ? ? ?

## 2022-03-05 NOTE — Patient Instructions (Addendum)
1. Moderate persistent asthma without complication ?- Lung function looks great today.  ?- We are going to give you a prolonged prednisone burst. ?- We might change to [employer] (info provided). ?- Spacer demonstration and sample provided.  ?- Daily controller medication(s): Singulair 10mg  daily and Trelegy 200/62.5/25 one puff once daily and Fasenra (every 8 weeks) ?- Prior to physical activity: ProAir 2 puffs 10-15 minutes before physical activity. ?- Rescue medications: ProAir 4 puffs every 4-6 hours as needed ?- Asthma control goals:  ?* Full participation in all desired activities (may need albuterol before activity) ?* Albuterol use two time or less a week on average (not counting use with activity) ?* Cough interfering with sleep two time or less a month ?* Oral steroids no more than once a year ?* No hospitalizations ? ?2. Non-allergic rhinitis ?- We will not make any medication changes at all.  ?- Continue with: Flonase (fluticasone) one spray per nostril daily  ? ?3. Return in about 4 months (around 07/06/2022).  ? ? ?Please inform 09/05/2022 of any Emergency Department visits, hospitalizations, or changes in symptoms. Call us before going to the ED for breathing or allergy symptoms since we might be able to fit you in for a sick visit. Feel free to contact us anytime with any questions, problems, or concerns. ? ?It was a pleasure to see you again today! ? ?Websites that have reliable patient information: ?1. American Academy of Asthma, Allergy, and Immunology: www.aaaai.org ?2. Food Allergy Research and Education (FARE): foodallergy.org ?3. Mothers of Asthmatics: http://www.asthmacommunitynetwork.org ?4. [place] of Allergy, Asthma, and Immunology: [employer] ? ? ?COVID-19 Vaccine Information can be found at: [email or web address].ca For questions related to vaccine distribution or appointments, please email [email or web address] or call [phone].   ? ?We realize that you might be concerned about having an allergic reaction to the COVID19 vaccines. To help with that concern, WE ARE OFFERING THE COVID19 VACCINES IN OUR OFFICE! Ask the front desk for dates!  ? ? ? ??Like? [phone] on Facebook and Instagram for our latest updates!  ?  ? ? ?A healthy democracy works best when [place] participate! Make sure you are registered to vote! If you have moved or changed any of your contact information, you will need to get this updated before voting! ? ?In some cases, you MAY be able to register to vote online: [employer] ? ? ? ? ?

## 2022-03-07 ENCOUNTER — Encounter: Payer: Self-pay | Admitting: Allergy & Immunology

## 2022-03-07 ENCOUNTER — Telehealth: Payer: Self-pay | Admitting: *Deleted

## 2022-03-07 NOTE — Telephone Encounter (Signed)
Called [facility] to inquire on how often patient has gotten his Berna Bue and his last dose was shipped July 2022 ?

## 2022-03-07 NOTE — Telephone Encounter (Signed)
-----   Message from Alfonse Spruce, MD sent at 03/07/2022  5:06 AM EDT ----- ?Is there any way that you can check to see how often he gets his Harrington Challenger?  I think he self administers at home now, so we have not been able to track it.  I just want to know how compliant he has been with it.  I am thinking about changing him to [employer].  He tells me during the visit that he never felt that the Harrington Challenger did much, although it pointed out that he has required prednisone left. ?

## 2022-03-07 NOTE — Telephone Encounter (Signed)
He needs to get back on laboratory talk about changing biologics.  But I guess that I think about Dorothea Ogle is that it has to be given in office for right now. ? ?Malachi Bonds, MD ?[facility] ? ?

## 2022-03-08 ENCOUNTER — Other Ambulatory Visit: Payer: Self-pay | Admitting: Internal Medicine

## 2022-03-08 DIAGNOSIS — N1831 Chronic kidney disease, stage 3a: Secondary | ICD-10-CM

## 2022-03-09 ENCOUNTER — Other Ambulatory Visit: Payer: Self-pay | Admitting: Allergy & Immunology

## 2022-03-09 NOTE — Telephone Encounter (Signed)
Started Tezspire approval ?

## 2022-03-13 ENCOUNTER — Other Ambulatory Visit: Payer: Self-pay | Admitting: Allergy & Immunology

## 2022-03-14 ENCOUNTER — Encounter: Payer: Self-pay | Admitting: Allergy & Immunology

## 2022-03-14 NOTE — Telephone Encounter (Signed)
Spoke to patient and advised approval, copay card to be emailed to him and submit to [facility].  ?

## 2022-03-14 NOTE — Telephone Encounter (Signed)
Patient called to see if you had seen his my chart request.  ?

## 2022-03-15 NOTE — Telephone Encounter (Signed)
Patient called requesting another round of prednisone. Pt's request was denied through mychart. ?

## 2022-03-15 NOTE — Telephone Encounter (Signed)
Great - thank you!   Ceira Hoeschen, MD [facility]  

## 2022-03-16 MED ORDER — PREDNISONE 10 MG PO TABS: ORAL_TABLET | ORAL | 0 refills | Status: DC

## 2022-03-16 NOTE — Addendum Note (Signed)
Addended by: Alfonse Spruce on: 03/16/2022 07:29 AM ? ? Modules accepted: Orders ? ?

## 2022-03-16 NOTE — Telephone Encounter (Signed)
I did not see his MyChart. I sent in another round of prednisone. Hopefully the Tezspire changes his trajectory.   ? ?Malachi Bonds, MD ?[facility] ? ?

## 2022-03-26 ENCOUNTER — Ambulatory Visit: Payer: BC Managed Care – PPO | Admitting: Family Medicine

## 2022-03-26 DIAGNOSIS — J4540 Moderate persistent asthma, uncomplicated: Secondary | ICD-10-CM | POA: Diagnosis not present

## 2022-03-26 DIAGNOSIS — N289 Disorder of kidney and ureter, unspecified: Secondary | ICD-10-CM

## 2022-03-26 DIAGNOSIS — I10 Essential (primary) hypertension: Secondary | ICD-10-CM | POA: Diagnosis not present

## 2022-03-26 DIAGNOSIS — E785 Hyperlipidemia, unspecified: Secondary | ICD-10-CM

## 2022-03-26 NOTE — Progress Notes (Signed)
Subjective:  Patient ID: Erik Ellis, male    DOB: 11/14/1965  Age: 56 y.o. MRN: [id number]  CC: Chief Complaint  Patient presents with   Hypertension    Follow up    HPI:  56 year old male with hypertension, asthma, GERD, renal sufficiency/CKD, hyperlipidemia, bipolar disorder presents for follow-up.  Hypertension is well controlled and at goal.  He is doing well on amlodipine.  No complaints or concerns at this time.  Patient is tolerating atorvastatin regarding hyperlipidemia.  Recently had trouble with asthma and was prescribed prednisone.  Now reports that he is doing better.  Mood is stable.  He is managed by an outside physician regarding this.  Patient Active Problem List   Diagnosis Date Noted   Essential hypertension 09/24/2021   Hyperlipidemia 09/24/2021   Mild neurocognitive disorder due to multiple etiologies 03/20/2021   S/P cervical spinal fusion 02/21/2021   Moderate persistent asthma without complication 05/12/2020   Mild renal insufficiency 10/04/2019   Allergic rhinitis 02/17/2016   Bipolar disorder (HCC) 05/31/2015   Gastroesophageal reflux disease 05/31/2015   H/O colonoscopy with polypectomy 09/02/2012   Sleep disorder 12/04/2011    Social Hx   Social History   Socioeconomic History   Marital status: Married    Spouse name: Not on file   Number of children: Not on file   Years of education: Not on file   Highest education level: Not on file  Occupational History   Not on file  Tobacco Use   Smoking status: Never   Smokeless tobacco: Never  Vaping Use   Vaping Use: Never used  Substance and Sexual Activity   Alcohol use: Not Currently   Drug use: No   Sexual activity: Not on file  Other Topics Concern   Not on file  Social History Narrative   Not on file   Social Determinants of Health   Financial Resource Strain: Not on file  Food Insecurity: Not on file  Transportation Needs: Not on file  Physical Activity: Not on file   Stress: Not on file  Social Connections: Not on file    Review of Systems  Constitutional: Negative.   Respiratory: Negative.    Per HPI  Objective:  BP 138/78   Pulse 78   Temp 97.7 F (36.5 C) (Oral)   Ht 5' 8.5" (1.74 m)   Wt 170 lb 12.8 oz (77.5 kg)   SpO2 100%   BMI 25.59 kg/m      03/26/2022    1:06 PM 03/05/2022   11:35 AM 10/03/2021    3:53 PM  BP/Weight  Systolic BP 138 154 140  Diastolic BP 78 90 72  Wt. (Lbs) 170.8 171.6   BMI 25.59 kg/m2 25.71 kg/m2     Physical Exam Vitals and nursing note reviewed.  Constitutional:      General: He is not in acute distress.    Appearance: Normal appearance.  HENT:     Head: Normocephalic and atraumatic.  Eyes:     General:        Right eye: No discharge.        Left eye: No discharge.     Conjunctiva/sclera: Conjunctivae normal.  Cardiovascular:     Rate and Rhythm: Normal rate and regular rhythm.  Pulmonary:     Effort: Pulmonary effort is normal.     Breath sounds: Normal breath sounds. No wheezing, rhonchi or rales.  Neurological:     Mental Status: He is alert.  Psychiatric:  Mood and Affect: Mood normal.        Behavior: Behavior normal.    Lab Results  Component Value Date   WBC 5.4 01/08/2022   HGB 14.2 01/08/2022   HCT 43.3 01/08/2022   PLT 272 01/08/2022   GLUCOSE 94 01/08/2022   CHOL 225 (H) 01/08/2022   TRIG 170 (H) 01/08/2022   HDL 46 01/08/2022   LDLCALC 148 (H) 01/08/2022   ALT 15 01/08/2022   AST 20 01/08/2022   NA 142 01/08/2022   K 4.6 01/08/2022   CL 102 01/08/2022   CREATININE 1.45 (H) 01/08/2022   BUN 26 (H) 01/08/2022   CO2 26 01/08/2022   TSH 0.529 01/08/2022   INR 1.0 02/21/2021   HGBA1C 5.4 01/08/2022     Assessment & Plan:   Problem List Items Addressed This Visit       Cardiovascular and Mediastinum   Essential hypertension    Hypertension stable.  Continue amlodipine.         Respiratory   Moderate persistent asthma without complication     Recent flare.  Doing well at this time.         Genitourinary   Mild renal insufficiency    Has recently seen nephrology.  Has upcoming renal ultrasound.         Other   Hyperlipidemia    Continue atorvastatin.  Planning on lipid panel at next visit to reassess.       Follow-up:  Return in about 6 months (around 09/26/2022).  [place]

## 2022-03-26 NOTE — Assessment & Plan Note (Signed)
Hypertension stable.  Continue amlodipine. 

## 2022-03-26 NOTE — Assessment & Plan Note (Signed)
Continue atorvastatin.  Planning on lipid panel at next visit to reassess.

## 2022-03-26 NOTE — Assessment & Plan Note (Signed)
Recent flare.  Doing well at this time.

## 2022-03-26 NOTE — Patient Instructions (Signed)
Everything is stable.  Continue your meds.  Follow up in 6 months.

## 2022-03-26 NOTE — Assessment & Plan Note (Signed)
Has recently seen nephrology.  Has upcoming renal ultrasound.

## 2022-03-27 ENCOUNTER — Ambulatory Visit
Admission: RE | Admit: 2022-03-27 | Discharge: 2022-03-27 | Disposition: A | Discharge status: Home / Self Care | Payer: BC Managed Care – PPO | Source: Ambulatory Visit | Attending: Internal Medicine | Admitting: Internal Medicine

## 2022-03-27 DIAGNOSIS — N1831 Chronic kidney disease, stage 3a: Secondary | ICD-10-CM

## 2022-03-29 ENCOUNTER — Other Ambulatory Visit: Payer: Self-pay | Admitting: Internal Medicine

## 2022-03-29 DIAGNOSIS — N281 Cyst of kidney, acquired: Secondary | ICD-10-CM

## 2022-04-08 ENCOUNTER — Ambulatory Visit (INDEPENDENT_AMBULATORY_CARE_PROVIDER_SITE_OTHER): Payer: BC Managed Care – PPO

## 2022-04-08 DIAGNOSIS — J4550 Severe persistent asthma, uncomplicated: Secondary | ICD-10-CM | POA: Diagnosis not present

## 2022-04-08 MED ORDER — TEZEPELUMAB-EKKO 210 MG/1.91ML ~~LOC~~ SOSY
210.0000 mg | PREFILLED_SYRINGE | SUBCUTANEOUS | Status: DC
  Administered 2022-04-08 – 2022-08-02 (×4): 210 mg via SUBCUTANEOUS

## 2022-04-08 NOTE — Progress Notes (Signed)
Immunotherapy   Patient Details  Name: Erik Ellis MRN: [id number] Date of Birth: 07-07-1966  04/08/2022  [name] started injections for  Tezspire Frequency: Every twenty eight days Epi-Pen:Not needed. Consent signed and patient instructions given in office. Patient waited in the lobby for thirty minutes without an issue.   [name] 04/08/2022, 4:27 PM

## 2022-04-11 ENCOUNTER — Other Ambulatory Visit: Payer: Self-pay | Admitting: Family Medicine

## 2022-04-14 IMAGING — RF DG C-ARM 1-60 MIN
1 series · 5 of 5 positions shown · non-contrast
Comparison: October 16, 20.

CLINICAL DATA: C5-C7 lateral mass fusion.

EXAM:
DG C-ARM 1-60 MIN; CERVICAL SPINE - 2-3 VIEW
FLUOROSCOPY TIME:  Fluoroscopy Time:  30 seconds.
Radiation Exposure Index (if provided by the fluoroscopic device):
8.95 mGy.
Number of Acquired Spot Images: 5

[Series 1: run · 5 of 5 slices shown]
[im 1/5]
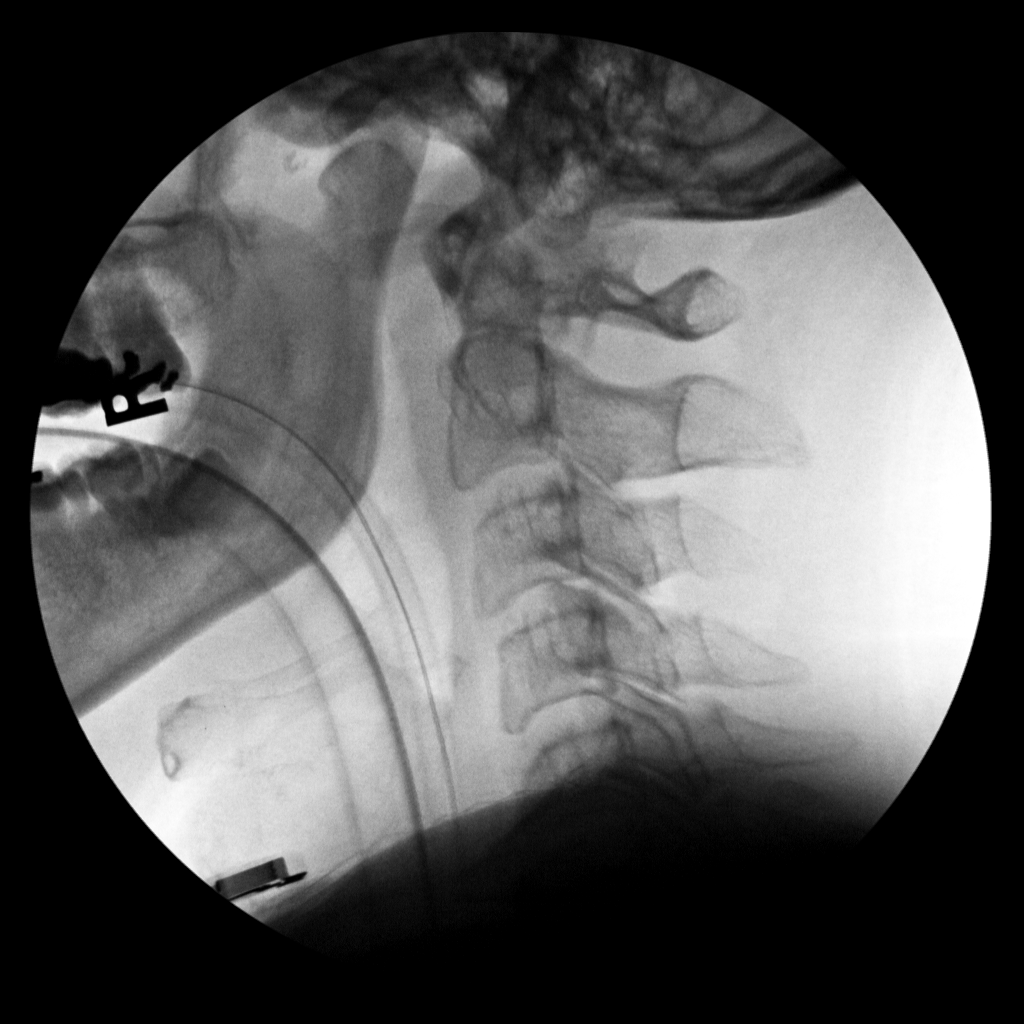
[im 2/5]
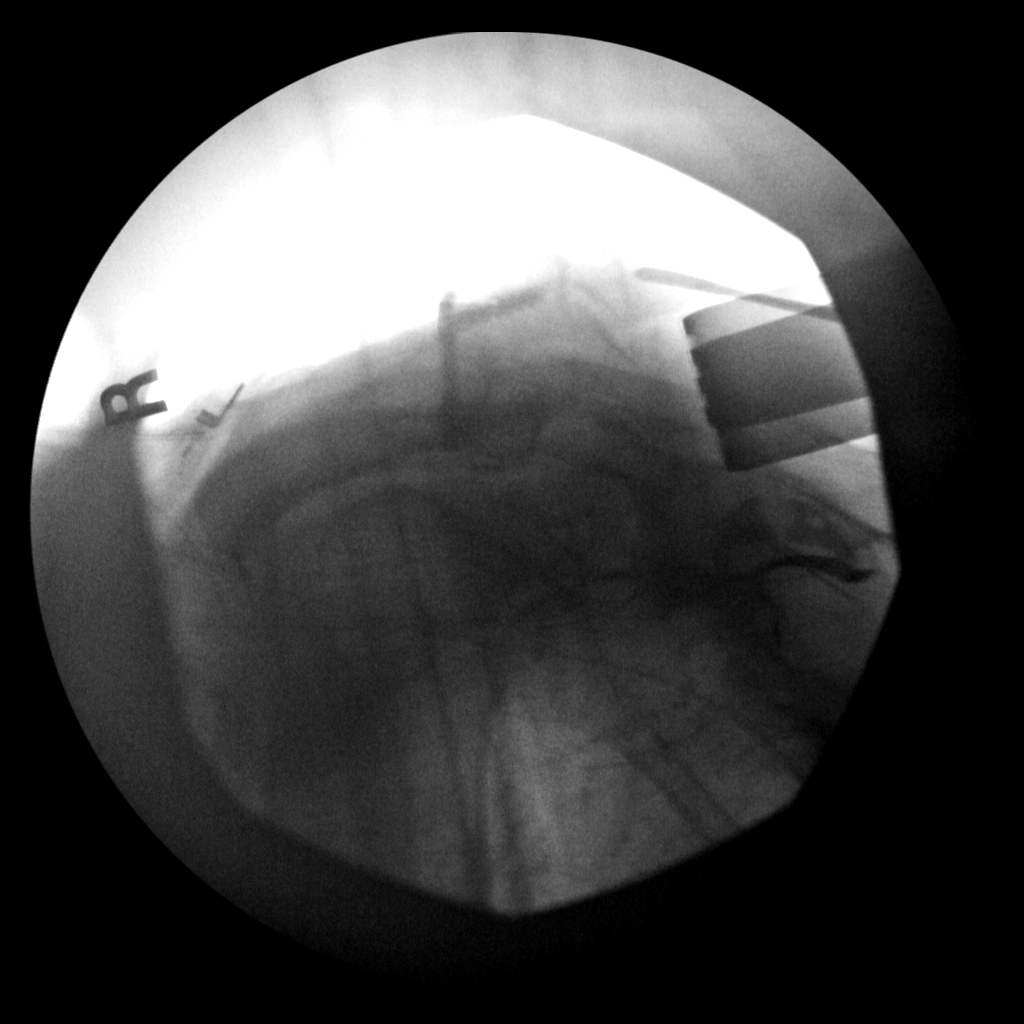
[im 3/5]
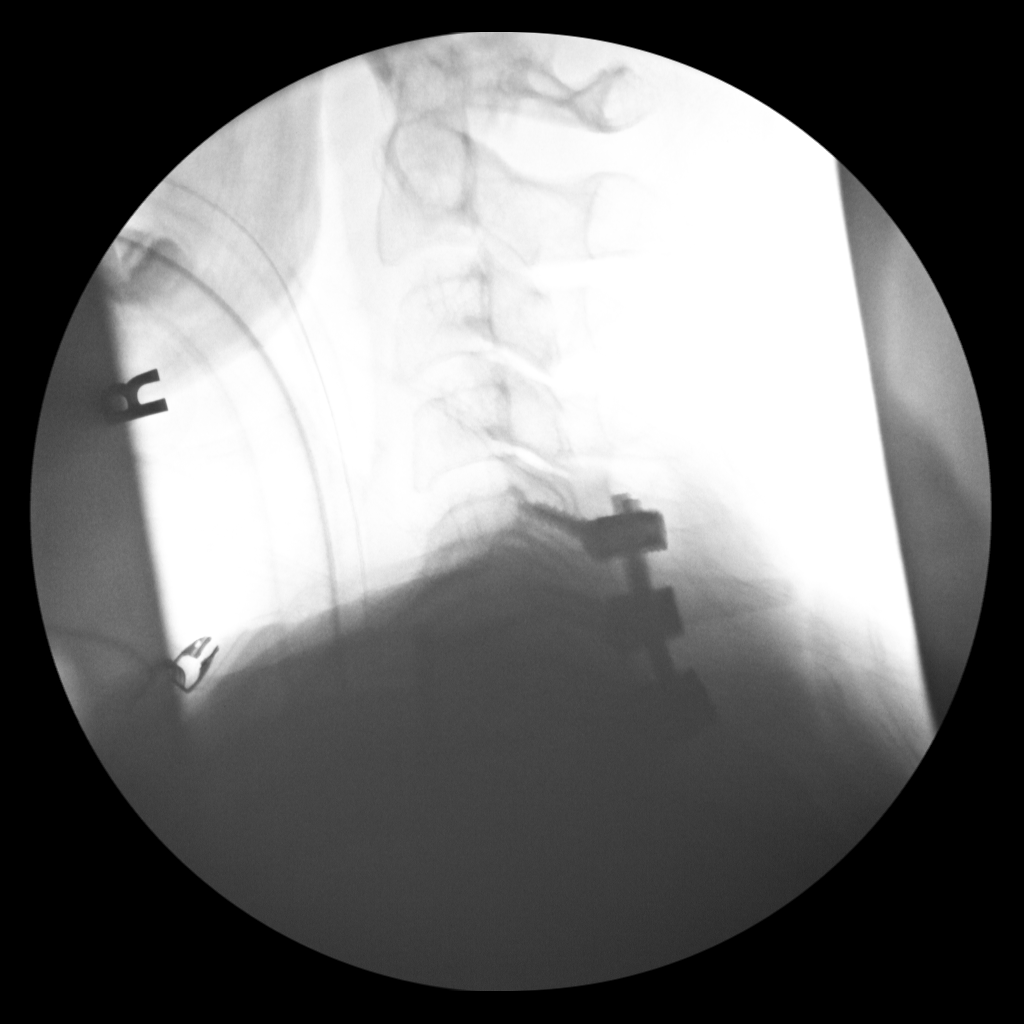
[im 4/5]
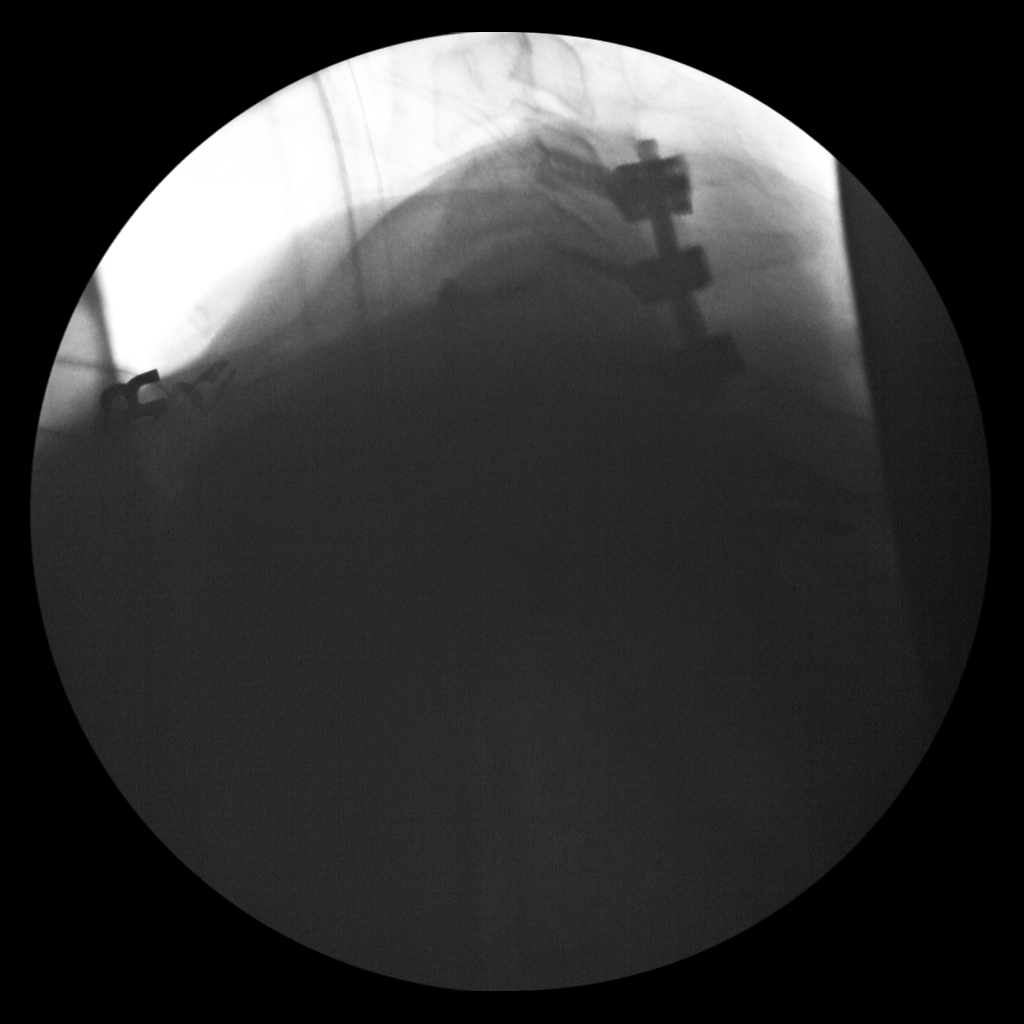
[im 5/5]
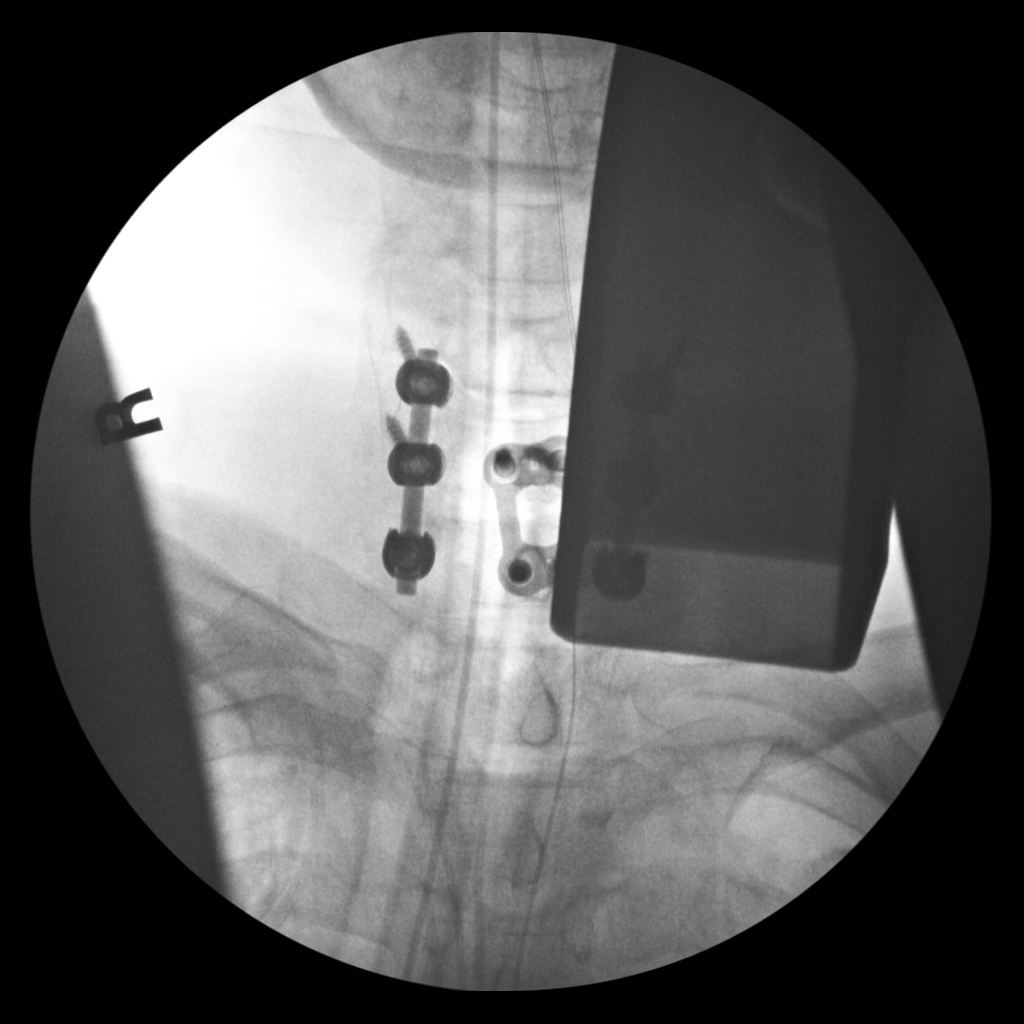

[5 of 5 positions shown; findings below may reference images not displayed]

FINDINGS: Five C-arm fluoroscopic images were obtained intraoperatively and
submitted for post operative interpretation. These images
demonstrate posterior lateral mass screws and intervening rods
bilaterally spanning from C5 to C7. Previously placed C6-C7 ACDF is
seen. Please see the performing provider's procedural report for
further detail.
IMPRESSION: Intraoperative fluoroscopy, as detailed above.

## 2022-04-25 ENCOUNTER — Ambulatory Visit
Admission: RE | Admit: 2022-04-25 | Discharge: 2022-04-25 | Disposition: A | Discharge status: Home / Self Care | Payer: BC Managed Care – PPO | Source: Ambulatory Visit | Attending: Internal Medicine | Admitting: Internal Medicine

## 2022-04-25 DIAGNOSIS — N281 Cyst of kidney, acquired: Secondary | ICD-10-CM

## 2022-04-25 MED ORDER — IOPAMIDOL (ISOVUE-300) INJECTION 61%
100.0000 mL | Freq: Once | INTRAVENOUS | Status: AC | PRN
  Administered 2022-04-25: 100 mL via INTRAVENOUS

## 2022-05-01 ENCOUNTER — Other Ambulatory Visit: Payer: Self-pay | Admitting: Allergy & Immunology

## 2022-05-01 ENCOUNTER — Encounter: Payer: Self-pay | Admitting: Family Medicine

## 2022-05-13 ENCOUNTER — Ambulatory Visit (INDEPENDENT_AMBULATORY_CARE_PROVIDER_SITE_OTHER): Payer: BC Managed Care – PPO

## 2022-05-13 DIAGNOSIS — J4550 Severe persistent asthma, uncomplicated: Secondary | ICD-10-CM

## 2022-06-10 ENCOUNTER — Ambulatory Visit (INDEPENDENT_AMBULATORY_CARE_PROVIDER_SITE_OTHER): Payer: BC Managed Care – PPO

## 2022-06-10 DIAGNOSIS — J4550 Severe persistent asthma, uncomplicated: Secondary | ICD-10-CM

## 2022-07-08 ENCOUNTER — Other Ambulatory Visit: Payer: Self-pay | Admitting: Family Medicine

## 2022-07-12 ENCOUNTER — Ambulatory Visit: Payer: BC Managed Care – PPO

## 2022-07-12 ENCOUNTER — Ambulatory Visit: Payer: BC Managed Care – PPO | Admitting: Allergy & Immunology

## 2022-07-18 ENCOUNTER — Other Ambulatory Visit: Payer: Self-pay | Admitting: Allergy & Immunology

## 2022-08-02 ENCOUNTER — Ambulatory Visit (INDEPENDENT_AMBULATORY_CARE_PROVIDER_SITE_OTHER): Payer: BC Managed Care – PPO

## 2022-08-02 DIAGNOSIS — J4550 Severe persistent asthma, uncomplicated: Secondary | ICD-10-CM

## 2022-08-02 NOTE — Progress Notes (Signed)
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 Following schedule: Every twenty eight days.  Frequency:Every four weeks. Epi-Pen: Not needed. Consent signed previously and patient instructions given on where and how to administer in the thigh and abdomen. Patient expressed that he did self injections with her prior medications and felt comfortable with this one.    [name] 08/02/2022, 1:42 PM

## 2022-08-21 ENCOUNTER — Telehealth: Payer: Self-pay | Admitting: *Deleted

## 2022-08-21 NOTE — Telephone Encounter (Signed)
L/m for patient to contact clinic for appt for [facility]

## 2022-09-20 ENCOUNTER — Other Ambulatory Visit: Payer: Self-pay | Admitting: Family Medicine

## 2022-09-20 ENCOUNTER — Other Ambulatory Visit: Payer: Self-pay | Admitting: Allergy & Immunology

## 2022-09-20 DIAGNOSIS — I10 Essential (primary) hypertension: Secondary | ICD-10-CM

## 2022-09-30 ENCOUNTER — Ambulatory Visit: Payer: BC Managed Care – PPO | Admitting: Family Medicine

## 2022-09-30 VITALS — BP 134/84 | Ht 68.5 in | Wt 175.0 lb

## 2022-09-30 DIAGNOSIS — I10 Essential (primary) hypertension: Secondary | ICD-10-CM

## 2022-09-30 DIAGNOSIS — N1831 Chronic kidney disease, stage 3a: Secondary | ICD-10-CM

## 2022-09-30 DIAGNOSIS — J4540 Moderate persistent asthma, uncomplicated: Secondary | ICD-10-CM

## 2022-09-30 DIAGNOSIS — E785 Hyperlipidemia, unspecified: Secondary | ICD-10-CM

## 2022-09-30 DIAGNOSIS — R5383 Other fatigue: Secondary | ICD-10-CM

## 2022-09-30 DIAGNOSIS — Z23 Encounter for immunization: Secondary | ICD-10-CM | POA: Diagnosis not present

## 2022-09-30 NOTE — Assessment & Plan Note (Signed)
Stable on Trelegy.  Continue. 

## 2022-09-30 NOTE — Assessment & Plan Note (Signed)
Now following with nephrology.

## 2022-09-30 NOTE — Patient Instructions (Signed)
Labs ordered.  Continue your medications.  Follow up with Psychiatry.   Follow up in 6 months.

## 2022-09-30 NOTE — Assessment & Plan Note (Signed)
I suspect that this is related to his underlying bipolar disorder or related to medications. Labs today for further evaluation today to evaluate for organic causes.

## 2022-09-30 NOTE — Assessment & Plan Note (Signed)
Well controlled. Continue amlodipine 

## 2022-09-30 NOTE — Progress Notes (Signed)
Subjective:  Patient ID: Erik Ellis, male    DOB: 26-Dec-1965  Age: 56 y.o. MRN: [id number]  CC: Chief Complaint  Patient presents with   Hypertension    Follow up Ongoing issues with fatigue and feeling tired    HPI:  56 year old male with hypertension, allergic rhinitis, persistent asthma, GERD, bipolar disorder followed by psychiatry, hyperlipidemia who presents for follow-up.  Patient reports ongoing fatigue over the past few weeks.  He is unsure of the cause.  He states that his mood is down.  Has upcoming follow-up with psychiatry on Wednesday.  He is on multiple medications which could be contributing to his fatigue.  Needs labs.  Asthma stable on Trelegy.  Hypertension stable on amlodipine.  Patient Active Problem List   Diagnosis Date Noted   Stage 3a chronic kidney disease ([place]) 09/30/2022   Other fatigue 09/30/2022   Essential hypertension 09/24/2021   Hyperlipidemia 09/24/2021   Mild neurocognitive disorder due to multiple etiologies 03/20/2021   S/P cervical spinal fusion 02/21/2021   Moderate persistent asthma without complication 64/68/0321   Allergic rhinitis 02/17/2016   Bipolar disorder ([place]) 05/31/2015   Gastroesophageal reflux disease 05/31/2015   H/O colonoscopy with polypectomy 09/02/2012   Sleep disorder 12/04/2011    Social Hx   Social History   Socioeconomic History   Marital status: Married    Spouse name: Not on file   Number of children: Not on file   Years of education: Not on file   Highest education level: Not on file  Occupational History   Not on file  Tobacco Use   Smoking status: Never   Smokeless tobacco: Never  Vaping Use   Vaping Use: Never used  Substance and Sexual Activity   Alcohol use: Not Currently   Drug use: No   Sexual activity: Not on file  Other Topics Concern   Not on file  Social History Narrative   Not on file   Social Determinants of Health   Financial Resource Strain: Not on file  Food  Insecurity: Not on file  Transportation Needs: Not on file  Physical Activity: Not on file  Stress: Not on file  Social Connections: Not on file    Review of Systems  Constitutional:  Positive for fatigue.  Respiratory: Negative.     Objective:  BP 134/84   Ht 5' 8.5" (1.74 m)   Wt 175 lb (79.4 kg)   BMI 26.22 kg/m      09/30/2022    1:15 PM 03/26/2022    1:06 PM 03/05/2022   11:35 AM  BP/Weight  Systolic BP 224 825 003  Diastolic BP 84 78 90  Wt. (Lbs) 175 170.8 171.6  BMI 26.22 kg/m2 25.59 kg/m2 25.71 kg/m2    Physical Exam Vitals and nursing note reviewed.  Constitutional:      General: He is not in acute distress.    Appearance: Normal appearance.  HENT:     Head: Normocephalic and atraumatic.  Eyes:     General:        Right eye: No discharge.        Left eye: No discharge.     Conjunctiva/sclera: Conjunctivae normal.  Cardiovascular:     Rate and Rhythm: Normal rate and regular rhythm.  Pulmonary:     Effort: Pulmonary effort is normal.     Breath sounds: Normal breath sounds. No wheezing, rhonchi or rales.  Neurological:     Mental Status: He is alert.  Psychiatric:  Comments: Flat affect. Depressed mood.     Lab Results  Component Value Date   WBC 5.4 01/08/2022   HGB 14.2 01/08/2022   HCT 43.3 01/08/2022   PLT 272 01/08/2022   GLUCOSE 94 01/08/2022   CHOL 225 (H) 01/08/2022   TRIG 170 (H) 01/08/2022   HDL 46 01/08/2022   LDLCALC 148 (H) 01/08/2022   ALT 15 01/08/2022   AST 20 01/08/2022   NA 142 01/08/2022   K 4.6 01/08/2022   CL 102 01/08/2022   CREATININE 1.45 (H) 01/08/2022   BUN 26 (H) 01/08/2022   CO2 26 01/08/2022   TSH 0.529 01/08/2022   INR 1.0 02/21/2021   HGBA1C 5.4 01/08/2022     Assessment & Plan:   Problem List Items Addressed This Visit       Cardiovascular and Mediastinum   Essential hypertension - Primary    Well-controlled.  Continue amlodipine.        Respiratory   Moderate persistent asthma without  complication    Stable on Trelegy.  Continue.        Genitourinary   Stage 3a chronic kidney disease ([place])    Now following with nephrology.      Relevant Orders   CMP14+EGFR     Other   Hyperlipidemia   Relevant Orders   Lipid panel   Other fatigue    I suspect that this is related to his underlying bipolar disorder or related to medications. Labs today for further evaluation today to evaluate for organic causes.      Relevant Orders   CBC   Vitamin B12   TSH   Other Visit Diagnoses     Need for vaccination       Relevant Orders   Flu Vaccine QUAD 19moIM (Fluarix, Fluzone & Alfiuria Quad PF) (Completed)      Follow-up:  Return in about 6 months (around 03/31/2023).  [place]

## 2022-10-06 ENCOUNTER — Other Ambulatory Visit: Payer: Self-pay | Admitting: Family Medicine

## 2022-10-13 ENCOUNTER — Other Ambulatory Visit: Payer: Self-pay | Admitting: Allergy & Immunology

## 2022-10-17 ENCOUNTER — Other Ambulatory Visit: Payer: Self-pay | Admitting: Allergy & Immunology

## 2022-11-03 ENCOUNTER — Other Ambulatory Visit: Payer: Self-pay | Admitting: Allergy & Immunology

## 2022-11-05 ENCOUNTER — Other Ambulatory Visit: Payer: Self-pay | Admitting: Allergy & Immunology

## 2022-11-06 ENCOUNTER — Other Ambulatory Visit: Payer: Self-pay | Admitting: Allergy & Immunology

## 2022-11-06 ENCOUNTER — Other Ambulatory Visit: Payer: Self-pay | Admitting: Family Medicine

## 2022-11-06 DIAGNOSIS — I10 Essential (primary) hypertension: Secondary | ICD-10-CM

## 2022-11-07 ENCOUNTER — Encounter: Payer: Self-pay | Admitting: Allergy & Immunology

## 2022-11-07 ENCOUNTER — Ambulatory Visit: Payer: BC Managed Care – PPO | Admitting: Allergy & Immunology

## 2022-11-07 ENCOUNTER — Other Ambulatory Visit: Payer: Self-pay

## 2022-11-07 VITALS — BP 134/80 | HR 100 | Temp 98.2°F | Resp 16 | Ht 67.0 in | Wt 174.8 lb

## 2022-11-07 DIAGNOSIS — U099 Post covid-19 condition, unspecified: Secondary | ICD-10-CM

## 2022-11-07 DIAGNOSIS — J4550 Severe persistent asthma, uncomplicated: Secondary | ICD-10-CM

## 2022-11-07 DIAGNOSIS — J310 Chronic rhinitis: Secondary | ICD-10-CM

## 2022-11-07 DIAGNOSIS — K219 Gastro-esophageal reflux disease without esophagitis: Secondary | ICD-10-CM

## 2022-11-07 MED ORDER — TRELEGY ELLIPTA 200-62.5-25 MCG/ACT IN AEPB: 1.0000 | INHALATION_SPRAY | Freq: Every day | RESPIRATORY_TRACT | 5 refills | Status: AC

## 2022-11-07 MED ORDER — FLUTICASONE PROPIONATE 50 MCG/ACT NA SUSP: 1.0000 | Freq: Every day | NASAL | 1 refills | Status: DC

## 2022-11-07 MED ORDER — ALBUTEROL SULFATE HFA 108 (90 BASE) MCG/ACT IN AERS: 2.0000 | INHALATION_SPRAY | RESPIRATORY_TRACT | 1 refills | Status: DC | PRN

## 2022-11-07 MED ORDER — MONTELUKAST SODIUM 10 MG PO TABS: 10.0000 mg | ORAL_TABLET | Freq: Every day | ORAL | 5 refills | Status: DC

## 2022-11-07 NOTE — Progress Notes (Signed)
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 Moderate persistent asthma without complication - Lung function looks great today.  - I am glad that we made the change to Tezspire.  - Daily controller medication(s): Singulair 10mg  daily and Trelegy 200/62.5/25 one puff once daily and Fasenra (every 8 weeks) - Prior to physical activity: ProAir 2 puffs 10-15 minutes before physical activity. - Rescue medications: ProAir 4 puffs every 4-6 hours as needed - Asthma control goals:  * Full participation in all desired activities (may need albuterol before activity) * Albuterol use two time or less a week on average (not counting use with activity) * Cough interfering with sleep two time or less a month * Oral steroids no more than once a year * No hospitalizations  2. Non-allergic rhinitis - We will not make any medication changes at all.  - Continue with: Flonase (fluticasone) one spray per nostril daily   3. Return in about 6 months (around 05/08/2023).   Subjective:   Erik Ellis is a 57 y.o. male presenting today for follow up of  Chief Complaint  Patient presents with   Medication Refill    Erik Ellis has a history of the following: Patient Active Problem List   Diagnosis Date Noted   Stage 3a chronic kidney disease ([place]) 09/30/2022   Other fatigue 09/30/2022   Essential hypertension 09/24/2021   Hyperlipidemia 09/24/2021   Mild neurocognitive disorder due to multiple etiologies 03/20/2021   S/P cervical spinal fusion 02/21/2021   Moderate persistent asthma without complication 10/93/2355   Allergic  rhinitis 02/17/2016   Bipolar disorder ([place]) 05/31/2015   Gastroesophageal reflux disease 05/31/2015   H/O colonoscopy with polypectomy 09/02/2012   Sleep disorder 12/04/2011    History obtained from: chart review and patient.  Erik Ellis is a 57 y.o. male presenting for a follow up visit.  He was last seen in May 2023.  At that time, his lung function looked great.  We talked about changing to [facility].  He did finally make the decision to do that.  He got his first injection in June 2023 and now administers it at home.  His last steroid course was in May 2023.  We continued with the Trelegy and the Singulair.  For his rhinitis, continue with Flonase daily as needed.  Since last visit, he has done very well.  Asthma/Respiratory Symptom History: He is doing well with the Trelegy one puff once daily. He is doing the Tezspire once monthly. He cannot remember the last one that he did. He is getting ir around every month. This seems to be helping.  This seems to maybe be a bit better than Fasenra. He is using his rescue inhaler very rarely, not since the summer or even earlier. He is not coughing at night.  He has not needed prednisone and probably 6 to 9 months.  He previously required it 2-3 times a year, even on Fasenra. ACT is 25, indicating excellent asthma control.  Allergic Rhinitis Symptom History: He uses the fluticasone once daily in the morning.  He has not needed antibiotics at all since we  saw him last time.  This is a good time of the year for his symptoms.  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 He has three children - ages 53, 50, 45.   Otherwise, there have been no changes to his past medical history, surgical history, family history, or social history.    Review of Systems  Constitutional: Negative.  Negative for chills, fever, malaise/fatigue and weight loss.  HENT:  Negative for congestion, ear discharge, ear pain and sinus pain.    Eyes:  Negative for pain, discharge and redness.  Respiratory:  Negative for cough, sputum production, shortness of breath and wheezing.   Cardiovascular: Negative.  Negative for chest pain and palpitations.  Gastrointestinal:  Negative for abdominal pain, constipation, diarrhea, heartburn, nausea and vomiting.  Skin: Negative.  Negative for itching and rash.  Neurological:  Negative for dizziness and headaches.  Endo/Heme/Allergies:  Negative for environmental allergies. Does not bruise/bleed easily.       Objective:   Blood pressure 134/80, pulse 100, temperature 98.2 F (36.8 C), temperature source Oral, resp. rate 16, height 5\' 7"  (1.702 m), weight 174 lb 12.8 oz (79.3 kg). Body mass index is 27.38 kg/m.    Physical Exam Vitals reviewed.  Constitutional:      Appearance: He is well-developed.     Comments: Smiling. Very friendly.   HENT:     Head: Normocephalic and atraumatic.     Right Ear: Tympanic membrane, ear canal and external ear normal.     Left Ear: Tympanic membrane, ear canal and external ear normal.     Nose: No nasal deformity, septal deviation, mucosal edema or rhinorrhea.     Right Turbinates: Enlarged and swollen.     Left Turbinates: Enlarged and swollen.     Right Sinus: No maxillary sinus tenderness or frontal sinus tenderness.     Left Sinus: No maxillary sinus tenderness or frontal sinus tenderness.     Mouth/Throat:     Mouth: Mucous membranes are not pale and not dry.     Pharynx: Uvula midline.     Comments: Cobblestoning in the posterior oropharynx. Eyes:     General: Lids are normal. No allergic shiner.       Right eye: No discharge.        Left eye: No discharge.     Conjunctiva/sclera: Conjunctivae normal.     Right eye: Right conjunctiva is not injected. No chemosis.    Left eye: Left conjunctiva is not injected. No chemosis.    Pupils: Pupils are equal, round, and reactive to light.  Cardiovascular:     Rate and Rhythm: Normal rate  and regular rhythm.     Heart sounds: Normal heart sounds.  Pulmonary:     Effort: Pulmonary effort is normal. No tachypnea, accessory muscle usage or respiratory distress.     Breath sounds: No wheezing, rhonchi or rales.     Comments: Moving air well in all lung fields.  No increased work of breathing. Chest:     Chest wall: No tenderness.  Lymphadenopathy:     Cervical: No cervical adenopathy.  Skin:    Coloration: Skin is not pale.     Findings: No abrasion, erythema, petechiae or rash. Rash is not papular, urticarial or vesicular.  Neurological:     Mental Status: He is alert.  Psychiatric:        Behavior: Behavior is cooperative.  Salvatore Marvel, MD  [facility] [place]

## 2022-11-07 NOTE — Patient Instructions (Addendum)
1. Moderate persistent asthma without complication - Lung function looks great today.  - I am glad that we made the change to Tezspire.  - Daily controller medication(s): Singulair 10mg  daily and Trelegy 200/62.5/25 one puff once daily and Fasenra (every 8 weeks) - Prior to physical activity: ProAir 2 puffs 10-15 minutes before physical activity. - Rescue medications: ProAir 4 puffs every 4-6 hours as needed - Asthma control goals:  * Full participation in all desired activities (may need albuterol before activity) * Albuterol use two time or less a week on average (not counting use with activity) * Cough interfering with sleep two time or less a month * Oral steroids no more than once a year * No hospitalizations  2. Non-allergic rhinitis - We will not make any medication changes at all.  - Continue with: Flonase (fluticasone) one spray per nostril daily   3. Return in about 6 months (around 05/08/2023).    Please inform us of any Emergency Department visits, hospitalizations, or changes in symptoms. Call us before going to the ED for breathing or allergy symptoms since we might be able to fit you in for a sick visit. Feel free to contact us anytime with any questions, problems, or concerns.  It was a pleasure to see you again today!  Websites that have reliable patient information: 1. American Academy of Asthma, Allergy, and Immunology: www.aaaai.org 2. Food Allergy Research and Education (FARE): foodallergy.org 3. Mothers of Asthmatics: http://www.asthmacommunitynetwork.org 4. American College of Allergy, Asthma, and Immunology: www.acaai.org   COVID-19 Vaccine Information can be found at: ShippingScam.co.uk For questions related to vaccine distribution or appointments, please email [email or web address] or call [phone].   We realize that you might be concerned about having an allergic reaction to the COVID19 vaccines.  To help with that concern, WE ARE OFFERING THE COVID19 VACCINES IN OUR OFFICE! Ask the front desk for dates!     "Like" [place] on Facebook and Instagram for our latest updates!      A healthy democracy works best when [employer] participate! Make sure you are registered to vote! If you have moved or changed any of your contact information, you will need to get this updated before voting!  In some cases, you MAY be able to register to vote online: [email or web address]

## 2022-11-12 ENCOUNTER — Ambulatory Visit: Payer: BC Managed Care – PPO | Admitting: Allergy & Immunology

## 2022-12-23 ENCOUNTER — Other Ambulatory Visit: Payer: Self-pay | Admitting: Family Medicine

## 2023-01-07 ENCOUNTER — Other Ambulatory Visit: Payer: Self-pay | Admitting: Family Medicine

## 2023-02-25 ENCOUNTER — Other Ambulatory Visit: Payer: Self-pay | Admitting: *Deleted

## 2023-02-25 MED ORDER — TEZSPIRE 210 MG/1.91ML ~~LOC~~ SOAJ: 210.0000 mg | SUBCUTANEOUS | 11 refills | Status: DC

## 2023-03-15 ENCOUNTER — Other Ambulatory Visit: Payer: Self-pay | Admitting: Family Medicine

## 2023-03-15 DIAGNOSIS — I10 Essential (primary) hypertension: Secondary | ICD-10-CM

## 2023-03-18 LAB — CMP14+EGFR
ALT: 24 IU/L (ref 0–44)
AST: 19 IU/L (ref 0–40)
Albumin/Globulin Ratio: 1.8 (ref 1.2–2.2)
Albumin: 4.5 g/dL (ref 3.8–4.9)
Alkaline Phosphatase: 135 IU/L — ABNORMAL HIGH (ref 44–121)
BUN/Creatinine Ratio: 22 — ABNORMAL HIGH (ref 9–20)
BUN: 33 mg/dL — ABNORMAL HIGH (ref 6–24)
Bilirubin Total: 0.8 mg/dL (ref 0.0–1.2)
CO2: 25 mmol/L (ref 20–29)
Calcium: 9.9 mg/dL (ref 8.7–10.2)
Chloride: 100 mmol/L (ref 96–106)
Creatinine, Ser: 1.53 mg/dL — ABNORMAL HIGH (ref 0.76–1.27)
Globulin, Total: 2.5 g/dL (ref 1.5–4.5)
Glucose: 122 mg/dL — ABNORMAL HIGH (ref 70–99)
Potassium: 4.7 mmol/L (ref 3.5–5.2)
Sodium: 140 mmol/L (ref 134–144)
Total Protein: 7 g/dL (ref 6.0–8.5)
eGFR: 52 mL/min/{1.73_m2} — ABNORMAL LOW (ref >59)

## 2023-03-18 LAB — CBC
Hematocrit: 40.5 % (ref 37.5–51.0)
Hemoglobin: 13.7 g/dL (ref 13.0–17.7)
MCH: 31.6 pg (ref 26.6–33.0)
MCHC: 33.8 g/dL (ref 31.5–35.7)
MCV: 94 fL (ref 79–97)
Platelets: 302 10*3/uL (ref 150–450)
RBC: 4.33 x10E6/uL (ref 4.14–5.80)
RDW: 11.9 % (ref 11.6–15.4)
WBC: 9.4 10*3/uL (ref 3.4–10.8)

## 2023-03-18 LAB — LIPID PANEL
Chol/HDL Ratio: 2.7 ratio (ref 0.0–5.0)
Cholesterol, Total: 126 mg/dL (ref 100–199)
HDL: 46 mg/dL (ref >39)
LDL Chol Calc (NIH): 59 mg/dL (ref 0–99)
Triglycerides: 118 mg/dL (ref 0–149)
VLDL Cholesterol Cal: 21 mg/dL (ref 5–40)

## 2023-03-18 LAB — VITAMIN B12: Vitamin B-12: 749 pg/mL (ref 232–1245)

## 2023-03-18 LAB — TSH: TSH: 0.963 u[IU]/mL (ref 0.450–4.500)

## 2023-03-26 ENCOUNTER — Ambulatory Visit: Payer: BC Managed Care – PPO | Admitting: Allergy & Immunology

## 2023-03-26 VITALS — BP 122/72 | HR 89 | Temp 100.2°F | Resp 16 | Ht 67.5 in | Wt 173.4 lb

## 2023-03-26 DIAGNOSIS — J4551 Severe persistent asthma with (acute) exacerbation: Secondary | ICD-10-CM | POA: Diagnosis not present

## 2023-03-26 DIAGNOSIS — J310 Chronic rhinitis: Secondary | ICD-10-CM | POA: Diagnosis not present

## 2023-03-26 DIAGNOSIS — K219 Gastro-esophageal reflux disease without esophagitis: Secondary | ICD-10-CM | POA: Diagnosis not present

## 2023-03-26 MED ORDER — PREDNISONE 10 MG PO TABS: ORAL_TABLET | ORAL | 1 refills | Status: DC

## 2023-03-26 NOTE — Patient Instructions (Addendum)
1. Moderate persistent asthma with acute exacerbation - Lung testing not done today since you were sick,  - Start the prednisone course provided today.  - I did put in a refill if you need it, but try to NOT get the second course. - I will check on the price  - Daily controller medication(s): Singulair 10mg  daily and Trelegy 200/62.5/25 one puff once daily and Fasenra (every 8 weeks) - Prior to physical activity: ProAir 2 puffs 10-15 minutes before physical activity. - Rescue medications: ProAir 4 puffs every 4-6 hours as needed - Asthma control goals:  * Full participation in all desired activities (may need albuterol before activity) * Albuterol use two time or less a week on average (not counting use with activity) * Cough interfering with sleep two time or less a month * Oral steroids no more than once a year * No hospitalizations  2. Non-allergic rhinitis - We will not make any medication changes at all.  - Continue with: Flonase (fluticasone) one spray per nostril daily   3. Return in about 2 months (around 05/26/2023).    Please inform us of any Emergency Department visits, hospitalizations, or changes in symptoms. Call us before going to the ED for breathing or allergy symptoms since we might be able to fit you in for a sick visit. Feel free to contact us anytime with any questions, problems, or concerns.  It was a pleasure to see you again today!  Websites that have reliable patient information: 1. American Academy of Asthma, Allergy, and Immunology: www.aaaai.org 2. Food Allergy Research and Education (FARE): foodallergy.org 3. Mothers of Asthmatics: http://www.asthmacommunitynetwork.org 4. American College of Allergy, Asthma, and Immunology: www.acaai.org   COVID-19 Vaccine Information can be found at: [email or web address].[email or web address] For questions related to vaccine distribution or appointments, please email  [email or web address] or call [phone].   We realize that you might be concerned about having an allergic reaction to the COVID19 vaccines. To help with that concern, WE ARE OFFERING THE COVID19 VACCINES IN OUR OFFICE! Ask the front desk for dates!     "Like" [place] on Facebook and Instagram for our latest updates!      A healthy democracy works best when [employer] participate! Make sure you are registered to vote! If you have moved or changed any of your contact information, you will need to get this updated before voting!  In some cases, you MAY be able to register to vote online: [email or web address]

## 2023-03-26 NOTE — Progress Notes (Unsigned)
FOLLOW UP  Date of Service/Encounter:  03/26/23   Assessment:   Moderate persistent asthma without complication - alpha-1 antitrpysin and Aspergillus precipitins all normal   Non-allergic rhinitis   COVID19 long hauler - doing better on Tezspire   Bipolar disorder - on lithium    Chronic neck pain with surgery ten years ago and now withh DDD   [employer]   Plan/Recommendations:    Patient Instructions  1. Moderate persistent asthma with acute exacerbation - Lung testing not done today since you were sick,  - Start the prednisone course provided today.  - I did put in a refill if you need it, but try to NOT get the second course. - I will check on the price  - Daily controller medication(s): Singulair 10mg  daily and Trelegy 200/62.5/25 one puff once daily and Fasenra (every 8 weeks) - Prior to physical activity: ProAir 2 puffs 10-15 minutes before physical activity. - Rescue medications: ProAir 4 puffs every 4-6 hours as needed - Asthma control goals:  * Full participation in all desired activities (may need albuterol before activity) * Albuterol use two time or less a week on average (not counting use with activity) * Cough interfering with sleep two time or less a month * Oral steroids no more than once a year * No hospitalizations  2. Non-allergic rhinitis - We will not make any medication changes at all.  - Continue with: Flonase (fluticasone) one spray per nostril daily   3. Return in about 2 months (around 05/26/2023).    Please inform us of any Emergency Department visits, hospitalizations, or changes in symptoms. Call us before going to the ED for breathing or allergy symptoms since we might be able to fit you in for a sick visit. Feel free to contact us anytime with any questions, problems, or concerns.  It was a pleasure to see you again today!  Websites that have reliable patient information: 1. American Academy of Asthma, Allergy, and  Immunology: www.aaaai.org 2. Food Allergy Research and Education (FARE): foodallergy.org 3. Mothers of Asthmatics: http://www.asthmacommunitynetwork.org 4. American College of Allergy, Asthma, and Immunology: www.acaai.org   COVID-19 Vaccine Information can be found at: [email or web address] For questions related to vaccine distribution or appointments, please email [email or web address] or call [phone].   We realize that you might be concerned about having an allergic reaction to the COVID19 vaccines. To help with that concern, WE ARE OFFERING THE COVID19 VACCINES IN OUR OFFICE! Ask the front desk for dates!     "Like" [place] on Facebook and Instagram for our latest updates!      A healthy democracy works best when [employer] participate! Make sure you are registered to vote! If you have moved or changed any of your contact information, you will need to get this updated before voting!  In some cases, you MAY be able to register to vote online: [email or web address]     Subjective:   Erik Ellis is a 57 y.o. male presenting today for follow up of  Chief Complaint  Patient presents with   Asthma    Say he has a bad cough and shortness of breath. Says it is worse at night.     [name] has a history of the following: Patient Active Problem List   Diagnosis Date Noted   Stage 3a chronic kidney disease (HCC) 09/30/2022   Other fatigue 09/30/2022   Essential hypertension 09/24/2021   Hyperlipidemia 09/24/2021   Mild neurocognitive disorder due to  multiple etiologies 03/20/2021   S/P cervical spinal fusion 02/21/2021   Moderate persistent asthma without complication 05/12/2020   Allergic rhinitis 02/17/2016   Bipolar disorder (HCC) 05/31/2015   Gastroesophageal reflux disease 05/31/2015   H/O colonoscopy with polypectomy 09/02/2012   Sleep disorder 12/04/2011    History  obtained from: chart review and {Persons; PED relatives w/patient:19415::"patient"}.  Erik Ellis is a 57 y.o. male presenting for {Blank single:19197::"a food challenge","a drug challenge","skin testing","a sick visit","an evaluation of ***","a follow up visit"}.  He was last seen in January 2024.  At that time, lung function looked great.  He was doing very well on Tezspire.  We continue with Trelegy 200 mcg 1 puff once daily.  For his rhinitis, he was doing fine with Flonase.  The last visit, he has mostly done well.   Asthma/Respiratory Symptom History: He remains on the Trelegy one puff once daily. He started having problems for two weeks. He does not have a nebulizer at home.  He has been doing albuterol 4-5 times per day on average. He has not had a fever. He thinks that this might be triggered by something out doors. He is doing spring practices.   He said that he was on a neb at some point.   {Blank single:19197::"Allergic Rhinitis Symptom History: ***"," "}  {Blank single:19197::"Food Allergy Symptom History: ***"," "}  {Blank single:19197::"Skin Symptom History: ***"," "}  {Blank single:19197::"GERD Symptom History: ***"," "}  Otherwise, there have been no changes to his past medical history, surgical history, family history, or social history.    ROS     Objective:   Blood pressure 122/72, pulse 89, temperature 100.2 F (37.9 C), temperature source Temporal, resp. rate 16, height 5' 7.5" (1.715 m), weight 173 lb 6.4 oz (78.7 kg), SpO2 96 %. Body mass index is 26.76 kg/m.    Physical Exam   Diagnostic studies: {Blank single:19197::"none","deferred due to recent antihistamine use","labs sent instead"," "}  Spirometry: {Blank single:19197::"results normal (FEV1: ***%, FVC: ***%, FEV1/FVC: ***%)","results abnormal (FEV1: ***%, FVC: ***%, FEV1/FVC: ***%)"}.    {Blank single:19197::"Spirometry consistent with mild obstructive disease","Spirometry consistent with moderate  obstructive disease","Spirometry consistent with severe obstructive disease","Spirometry consistent with possible restrictive disease","Spirometry consistent with mixed obstructive and restrictive disease","Spirometry uninterpretable due to technique","Spirometry consistent with normal pattern"}. {Blank single:19197::"Albuterol/Atrovent nebulizer","Xopenex/Atrovent nebulizer","Albuterol nebulizer","Albuterol four puffs via MDI","Xopenex four puffs via MDI"} treatment given in clinic with {Blank single:19197::"significant improvement in FEV1 per ATS criteria","significant improvement in FVC per ATS criteria","significant improvement in FEV1 and FVC per ATS criteria","improvement in FEV1, but not significant per ATS criteria","improvement in FVC, but not significant per ATS criteria","improvement in FEV1 and FVC, but not significant per ATS criteria","no improvement"}.  Allergy Studies: {Blank single:19197::"none","labs sent instead"," "}    {Blank single:19197::"Allergy testing results were read and interpreted by myself, documented by clinical staff."," "}      Malachi Bonds, MD  [facility]

## 2023-03-27 ENCOUNTER — Encounter: Payer: Self-pay | Admitting: Allergy & Immunology

## 2023-03-27 MED ORDER — MONTELUKAST SODIUM 10 MG PO TABS: 10.0000 mg | ORAL_TABLET | Freq: Every day | ORAL | 5 refills | Status: DC

## 2023-03-27 MED ORDER — FLUTICASONE PROPIONATE 50 MCG/ACT NA SUSP: 1.0000 | Freq: Every day | NASAL | 5 refills | Status: AC

## 2023-03-27 MED ORDER — ALBUTEROL SULFATE HFA 108 (90 BASE) MCG/ACT IN AERS: 2.0000 | INHALATION_SPRAY | RESPIRATORY_TRACT | 1 refills | Status: DC | PRN

## 2023-04-01 ENCOUNTER — Ambulatory Visit: Payer: BC Managed Care – PPO | Admitting: Family Medicine

## 2023-04-03 ENCOUNTER — Ambulatory Visit: Payer: BC Managed Care – PPO | Admitting: Family Medicine

## 2023-04-03 VITALS — BP 132/82 | HR 93 | Temp 98.1°F | Ht 67.5 in | Wt 173.0 lb

## 2023-04-03 DIAGNOSIS — E785 Hyperlipidemia, unspecified: Secondary | ICD-10-CM | POA: Diagnosis not present

## 2023-04-03 DIAGNOSIS — I10 Essential (primary) hypertension: Secondary | ICD-10-CM | POA: Diagnosis not present

## 2023-04-03 NOTE — Assessment & Plan Note (Signed)
Well controlled. Continue amlodipine 

## 2023-04-03 NOTE — Progress Notes (Signed)
Subjective:  Patient ID: Erik Ellis, male    DOB: 06/05/1966  Age: 57 y.o. MRN: [id number]  CC: Chief Complaint  Patient presents with   Hypertension   Hyperlipidemia    HPI:  57 year old male with hypertension, asthma, allergic rhinitis, GERD, chronic kidney disease, bipolar disorder, hyperlipidemia presents for follow-up.  Patient states that he has recently had an asthma exacerbation and is still on corticosteroids.  He has improved.  However, he states that he is having insomnia.  I feel that steroids are playing a role with this.  Hypertension is well-controlled on amlodipine.  Patient's lipids are now well-controlled at home atorvastatin.  He has responded quite well.  No other complaints or concerns at this time.  Patient Active Problem List   Diagnosis Date Noted   Stage 3a chronic kidney disease (HCC) 09/30/2022   Essential hypertension 09/24/2021   Hyperlipidemia 09/24/2021   Mild neurocognitive disorder due to multiple etiologies 03/20/2021   S/P cervical spinal fusion 02/21/2021   Moderate persistent asthma without complication 05/12/2020   Allergic rhinitis 02/17/2016   Bipolar disorder (HCC) 05/31/2015   Gastroesophageal reflux disease 05/31/2015   Sleep disorder 12/04/2011    Social Hx   Social History   Socioeconomic History   Marital status: Married    Spouse name: Not on file   Number of children: Not on file   Years of education: Not on file   Highest education level: Bachelor's degree (e.g., BA, AB, BS)  Occupational History   Not on file  Tobacco Use   Smoking status: Never   Smokeless tobacco: Never  Vaping Use   Vaping Use: Never used  Substance and Sexual Activity   Alcohol use: Not Currently   Drug use: No   Sexual activity: Not on file  Other Topics Concern   Not on file  Social History Narrative   Not on file   Social Determinants of Health   Financial Resource Strain: Low Risk  (04/01/2023)   Overall Financial Resource  Strain (CARDIA)    Difficulty of Paying Living Expenses: Not hard at all  Food Insecurity: No Food Insecurity (04/01/2023)   Hunger Vital Sign    Worried About Running Out of Food in the Last Year: Never true    Ran Out of Food in the Last Year: Never true  Transportation Needs: No Transportation Needs (04/01/2023)   PRAPARE - Administrator, Civil Service (Medical): No    Lack of Transportation (Non-Medical): No  Physical Activity: Sufficiently Active (04/01/2023)   Exercise Vital Sign    Days of Exercise per Week: 5 days    Minutes of Exercise per Session: 30 min  Stress: No Stress Concern Present (04/01/2023)   [employer] of Occupational Health - Occupational Stress Questionnaire    Feeling of Stress : Only a little  Social Connections: Moderately Integrated (04/01/2023)   Social Connection and Isolation Panel [NHANES]    Frequency of Communication with Friends and Family: Once a week    Frequency of Social Gatherings with Friends and Family: Once a week    Attends Religious Services: More than 4 times per year    Active Member of [employer] or Organizations: Yes    Attends Banker Meetings: Not on file    Marital Status: Married    Review of Systems Per HPI  Objective:  BP 132/82   Pulse 93   Temp 98.1 F (36.7 C)   Ht 5' 7.5" (1.715 m)  Wt 173 lb (78.5 kg)   SpO2 97%   BMI 26.70 kg/m      04/03/2023    4:02 PM 03/26/2023    2:41 PM 11/07/2022    5:19 PM  BP/Weight  Systolic BP 132 122 134  Diastolic BP 82 72 80  Wt. (Lbs) 173 173.4 174.8  BMI 26.7 kg/m2 26.76 kg/m2 27.38 kg/m2    Physical Exam Vitals and nursing note reviewed.  Constitutional:      General: He is not in acute distress.    Appearance: Normal appearance.  HENT:     Head: Normocephalic and atraumatic.  Cardiovascular:     Rate and Rhythm: Normal rate and regular rhythm.  Pulmonary:     Effort: Pulmonary effort is normal.     Breath sounds: Normal breath sounds.   Neurological:     Mental Status: He is alert.  Psychiatric:     Comments: Flat affect.     Lab Results  Component Value Date   WBC 9.4 03/17/2023   HGB 13.7 03/17/2023   HCT 40.5 03/17/2023   PLT 302 03/17/2023   GLUCOSE 122 (H) 03/17/2023   CHOL 126 03/17/2023   TRIG 118 03/17/2023   HDL 46 03/17/2023   LDLCALC 59 03/17/2023   ALT 24 03/17/2023   AST 19 03/17/2023   NA 140 03/17/2023   K 4.7 03/17/2023   CL 100 03/17/2023   CREATININE 1.53 (H) 03/17/2023   BUN 33 (H) 03/17/2023   CO2 25 03/17/2023   TSH 0.963 03/17/2023   INR 1.0 02/21/2021   HGBA1C 5.4 01/08/2022     Assessment & Plan:   Problem List Items Addressed This Visit       Cardiovascular and Mediastinum   Essential hypertension - Primary    Well-controlled.  Continue amlodipine.        Other   Hyperlipidemia    Lipids now at goal.  Continue Crestor.      Follow-up:  Return in about 6 months (around 10/04/2023).  Everlene Other DO [facility]

## 2023-04-03 NOTE — Patient Instructions (Signed)
Labs stable. Lipids improved.  Continue your current medications.  Follow up in 6 months.

## 2023-04-03 NOTE — Assessment & Plan Note (Signed)
Lipids now at goal.  Continue Crestor.

## 2023-05-14 ENCOUNTER — Ambulatory Visit: Payer: BC Managed Care – PPO | Admitting: Allergy & Immunology

## 2023-05-20 ENCOUNTER — Encounter: Payer: Self-pay | Admitting: Allergy & Immunology

## 2023-05-21 ENCOUNTER — Other Ambulatory Visit: Payer: Self-pay

## 2023-05-21 MED ORDER — TRELEGY ELLIPTA 200-62.5-25 MCG/ACT IN AEPB: 1.0000 | INHALATION_SPRAY | Freq: Every day | RESPIRATORY_TRACT | 0 refills | Status: DC

## 2023-05-26 ENCOUNTER — Other Ambulatory Visit: Payer: Self-pay | Admitting: Allergy & Immunology

## 2023-06-19 ENCOUNTER — Other Ambulatory Visit: Payer: Self-pay

## 2023-06-19 ENCOUNTER — Ambulatory Visit: Payer: BC Managed Care – PPO | Admitting: Allergy & Immunology

## 2023-06-19 ENCOUNTER — Encounter: Payer: Self-pay | Admitting: Allergy & Immunology

## 2023-06-19 ENCOUNTER — Other Ambulatory Visit: Payer: Self-pay | Admitting: Family Medicine

## 2023-06-19 VITALS — BP 134/86 | HR 89 | Temp 98.7°F | Resp 16 | Ht 67.5 in

## 2023-06-19 DIAGNOSIS — J310 Chronic rhinitis: Secondary | ICD-10-CM

## 2023-06-19 DIAGNOSIS — K219 Gastro-esophageal reflux disease without esophagitis: Secondary | ICD-10-CM | POA: Diagnosis not present

## 2023-06-19 DIAGNOSIS — J4551 Severe persistent asthma with (acute) exacerbation: Secondary | ICD-10-CM | POA: Diagnosis not present

## 2023-06-19 MED ORDER — PREDNISONE 10 MG PO TABS: ORAL_TABLET | ORAL | 0 refills | Status: DC

## 2023-06-19 MED ORDER — TRELEGY ELLIPTA 200-62.5-25 MCG/ACT IN AEPB: 1.0000 | INHALATION_SPRAY | Freq: Every day | RESPIRATORY_TRACT | 1 refills | Status: DC

## 2023-06-19 MED ORDER — ALBUTEROL SULFATE HFA 108 (90 BASE) MCG/ACT IN AERS: 2.0000 | INHALATION_SPRAY | RESPIRATORY_TRACT | 1 refills | Status: DC | PRN

## 2023-06-19 MED ORDER — MONTELUKAST SODIUM 10 MG PO TABS: 10.0000 mg | ORAL_TABLET | Freq: Every day | ORAL | 1 refills | Status: DC

## 2023-06-19 NOTE — Patient Instructions (Addendum)
1. Moderate persistent asthma with acute exacerbation - Lung testing not done today. - Start the prednisone course provided today.  - Trelegy sample provided today.  - Daily controller medication(s): Singulair 10mg  daily and Trelegy 200/62.5/25 one puff once daily and Tezspire monthly - Prior to physical activity: ProAir 2 puffs 10-15 minutes before physical activity. - Rescue medications: ProAir 4 puffs every 4-6 hours as needed - Asthma control goals:  * Full participation in all desired activities (may need albuterol before activity) * Albuterol use two time or less a week on average (not counting use with activity) * Cough interfering with sleep two time or less a month * Oral steroids no more than once a year * No hospitalizations  2. Non-allergic rhinitis - We will not make any medication changes at all.  - Continue with: Flonase (fluticasone) one spray per nostril daily   3. Return in about 6 months (around 12/20/2023).    Please inform us of any Emergency Department visits, hospitalizations, or changes in symptoms. Call us before going to the ED for breathing or allergy symptoms since we might be able to fit you in for a sick visit. Feel free to contact us anytime with any questions, problems, or concerns.  It was a pleasure to see you again today!  Websites that have reliable patient information: 1. American Academy of Asthma, Allergy, and Immunology: www.aaaai.org 2. Food Allergy Research and Education (FARE): foodallergy.org 3. Mothers of Asthmatics: http://www.asthmacommunitynetwork.org 4. American College of Allergy, Asthma, and Immunology: www.acaai.org   COVID-19 Vaccine Information can be found at: [email or web address] For questions related to vaccine distribution or appointments, please email [email or web address] or call [phone].   We realize that you might be concerned about having an allergic reaction  to the COVID19 vaccines. To help with that concern, WE ARE OFFERING THE COVID19 VACCINES IN OUR OFFICE! Ask the front desk for dates!     "Like" [place] on Facebook and Instagram for our latest updates!      A healthy democracy works best when [employer] participate! Make sure you are registered to vote! If you have moved or changed any of your contact information, you will need to get this updated before voting!  In some cases, you MAY be able to register to vote online: [email or web address]

## 2023-06-19 NOTE — Progress Notes (Signed)
FOLLOW UP  Date of Service/Encounter:  06/19/23   Assessment:   Moderate persistent asthma with acute exacerbation - alpha-1 antitrpysin and Aspergillus precipitins all normal    Non-allergic rhinitis   COVID19 long hauler - doing better on Tezspire   Bipolar disorder - on lithium    Chronic neck pain with surgery ten years ago and now with DDD   [employer]   Plan/Recommendations:   1. Moderate persistent asthma with acute exacerbation - Lung testing not done today. - Start the prednisone course provided today.  - Trelegy sample provided today.  - Daily controller medication(s): Singulair 10mg  daily and Trelegy 200/62.5/25 one puff once daily and Tezspire monthly - Prior to physical activity: ProAir 2 puffs 10-15 minutes before physical activity. - Rescue medications: ProAir 4 puffs every 4-6 hours as needed - Asthma control goals:  * Full participation in all desired activities (may need albuterol before activity) * Albuterol use two time or less a week on average (not counting use with activity) * Cough interfering with sleep two time or less a month * Oral steroids no more than once a year * No hospitalizations  2. Non-allergic rhinitis - We will not make any medication changes at all.  - Continue with: Flonase (fluticasone) one spray per nostril daily   3. Return in about 6 months (around 12/20/2023).    Subjective:   Erik Ellis is a 57 y.o. male presenting today for follow up of  Chief Complaint  Patient presents with   Cough    Dry cough   Allergic Rhinitis     Runny nose     Erik Ellis has a history of the following: Patient Active Problem List   Diagnosis Date Noted   Stage 3a chronic kidney disease (HCC) 09/30/2022   Essential hypertension 09/24/2021   Hyperlipidemia 09/24/2021   Mild neurocognitive disorder due to multiple etiologies 03/20/2021   S/P cervical spinal fusion 02/21/2021   Moderate persistent asthma without  complication 05/12/2020   Allergic rhinitis 02/17/2016   Bipolar disorder (HCC) 05/31/2015   Gastroesophageal reflux disease 05/31/2015   Sleep disorder 12/04/2011    History obtained from: chart review and patient.  Erik Ellis is a 57 y.o. male presenting for a sick visit. He was last seen in May 2024. At that time, we did not do lung testing and ended up treating him with systemic steroid. We continued him on Trelegy one puff once daily as well as Singulair. He was also changed to Tezspire instead of Harrington Challenger due ot recurrent need for prednisone. For his NAR, we continue with the use of Flonase.  Since the last visit, he has largely done well.   Asthma/Respiratory Symptom History: He started having a cough one week ago. He has remained on his Trelegy, although he only has a couple of days of dosing left. The cough has continued to worsen over time. He has been using his rescue inhaler frequently. He continues to get his Dorothea Ogle which he injects at home. He does feel that the Dorothea Ogle is doing better controlling his breathing compared to the [place]. He has not been to the hospital at all. Exacerbations frequently take plan in the spring and the fall, but again overall this has been lower than when he was on Fasenra.     Allergic Rhinitis Symptom History: He remains on the Flonase daily. This is working well to control his rhinitis symptoms. He has not been on antibiotics at all for sinus  or ear infections.   GERD Symptom History: He remains on Protonix. This is working well to control his GERD.   Otherwise, there have been no changes to his past medical history, surgical history, family history, or social history.    Review of systems otherwise negative other than that mentioned in the HPI.    Objective:   Blood pressure 134/86, pulse 89, temperature 98.7 F (37.1 C), resp. rate 16, height 5' 7.5" (1.715 m), SpO2 96%. Body mass index is 26.7 kg/m.    Physical Exam Vitals reviewed.   Constitutional:      Appearance: He is well-developed.     Comments: Smiling. Very friendly.   HENT:     Head: Normocephalic and atraumatic.     Right Ear: Tympanic membrane, ear canal and external ear normal.     Left Ear: Tympanic membrane, ear canal and external ear normal.     Nose: No nasal deformity, septal deviation, mucosal edema or rhinorrhea.     Right Turbinates: Enlarged and swollen. Not pale.     Left Turbinates: Enlarged and swollen. Not pale.     Right Sinus: No maxillary sinus tenderness or frontal sinus tenderness.     Left Sinus: No maxillary sinus tenderness or frontal sinus tenderness.     Mouth/Throat:     Mouth: Mucous membranes are not pale and not dry.     Pharynx: Uvula midline.     Comments: Cobblestoning in the posterior oropharynx. Eyes:     General: Lids are normal. No allergic shiner.       Right eye: No discharge.        Left eye: No discharge.     Conjunctiva/sclera: Conjunctivae normal.     Right eye: Right conjunctiva is not injected. No chemosis.    Left eye: Left conjunctiva is not injected. No chemosis.    Pupils: Pupils are equal, round, and reactive to light.  Cardiovascular:     Rate and Rhythm: Normal rate and regular rhythm.     Heart sounds: Normal heart sounds.  Pulmonary:     Effort: Respiratory distress present. No tachypnea or accessory muscle usage.     Breath sounds: Transmitted upper airway sounds present. Examination of the right-upper field reveals wheezing. Examination of the left-upper field reveals wheezing. Examination of the right-middle field reveals decreased breath sounds. Examination of the left-middle field reveals decreased breath sounds. Examination of the right-lower field reveals decreased breath sounds. Examination of the left-lower field reveals decreased breath sounds. Decreased breath sounds and wheezing present. No rhonchi or rales.     Comments: Moving air well in all lung fields.  No increased work of  breathing. Chest:     Chest wall: No tenderness.  Lymphadenopathy:     Cervical: No cervical adenopathy.  Skin:    Coloration: Skin is not pale.     Findings: No abrasion, erythema, petechiae or rash. Rash is not papular, urticarial or vesicular.  Neurological:     Mental Status: He is alert.  Psychiatric:        Behavior: Behavior is cooperative.      Diagnostic studies: none       Malachi Bonds, MD  [facility]

## 2023-06-22 ENCOUNTER — Encounter: Payer: Self-pay | Admitting: Allergy & Immunology

## 2023-06-23 ENCOUNTER — Telehealth: Payer: Self-pay

## 2023-06-23 NOTE — Telephone Encounter (Signed)
-----   Message from Alfonse Spruce sent at 06/22/2023  7:44 AM EDT ----- Can someone call and see how he is feeling

## 2023-06-23 NOTE — Telephone Encounter (Signed)
Called patient - DOB verified- stated the following:  Still has bad cough Some shortness of breath Runny nose  Patient has not tested himself for Covid. Patient stated he is taking OTC Mucinex - did not know if it was DM or not.  Patient advised message would be forwarded to provider as update.  Patient verbalized understanding, no further questions.

## 2023-07-02 ENCOUNTER — Other Ambulatory Visit: Payer: Self-pay | Admitting: Family Medicine

## 2023-09-13 ENCOUNTER — Other Ambulatory Visit: Payer: Self-pay | Admitting: Family Medicine

## 2023-09-13 DIAGNOSIS — I10 Essential (primary) hypertension: Secondary | ICD-10-CM

## 2023-09-30 ENCOUNTER — Ambulatory Visit: Payer: BC Managed Care – PPO | Admitting: Allergy & Immunology

## 2023-09-30 ENCOUNTER — Emergency Department (HOSPITAL_COMMUNITY): Payer: BC Managed Care – PPO

## 2023-09-30 ENCOUNTER — Encounter: Payer: Self-pay | Admitting: Allergy & Immunology

## 2023-09-30 ENCOUNTER — Telehealth: Payer: Self-pay

## 2023-09-30 ENCOUNTER — Emergency Department (HOSPITAL_COMMUNITY)
Admission: EM | Admit: 2023-09-30 | Discharge: 2023-10-01 | Disposition: A | Discharge status: Home / Self Care | Payer: BC Managed Care – PPO | Attending: Emergency Medicine | Admitting: Emergency Medicine

## 2023-09-30 ENCOUNTER — Other Ambulatory Visit: Payer: Self-pay

## 2023-09-30 VITALS — BP 136/86 | HR 92 | Temp 98.3°F | Resp 16 | Ht 67.0 in | Wt 170.0 lb

## 2023-09-30 DIAGNOSIS — N1831 Chronic kidney disease, stage 3a: Secondary | ICD-10-CM | POA: Diagnosis not present

## 2023-09-30 DIAGNOSIS — J387 Other diseases of larynx: Secondary | ICD-10-CM

## 2023-09-30 DIAGNOSIS — I129 Hypertensive chronic kidney disease with stage 1 through stage 4 chronic kidney disease, or unspecified chronic kidney disease: Secondary | ICD-10-CM | POA: Insufficient documentation

## 2023-09-30 DIAGNOSIS — R0602 Shortness of breath: Secondary | ICD-10-CM

## 2023-09-30 DIAGNOSIS — J45909 Unspecified asthma, uncomplicated: Secondary | ICD-10-CM | POA: Diagnosis not present

## 2023-09-30 DIAGNOSIS — Z79899 Other long term (current) drug therapy: Secondary | ICD-10-CM | POA: Insufficient documentation

## 2023-09-30 DIAGNOSIS — R799 Abnormal finding of blood chemistry, unspecified: Secondary | ICD-10-CM | POA: Diagnosis present

## 2023-09-30 DIAGNOSIS — R051 Acute cough: Secondary | ICD-10-CM | POA: Diagnosis not present

## 2023-09-30 DIAGNOSIS — J4550 Severe persistent asthma, uncomplicated: Secondary | ICD-10-CM | POA: Diagnosis not present

## 2023-09-30 DIAGNOSIS — K219 Gastro-esophageal reflux disease without esophagitis: Secondary | ICD-10-CM | POA: Diagnosis not present

## 2023-09-30 DIAGNOSIS — R7989 Other specified abnormal findings of blood chemistry: Secondary | ICD-10-CM

## 2023-09-30 LAB — CBC WITH DIFFERENTIAL/PLATELET
Abs Immature Granulocytes: 0.03 10*3/uL (ref 0.00–0.07)
Basophils Absolute: 0 10*3/uL (ref 0.0–0.1)
Basophils Relative: 0 %
Eosinophils Absolute: 0.1 10*3/uL (ref 0.0–0.5)
Eosinophils Relative: 1 %
HCT: 40 % (ref 39.0–52.0)
Hemoglobin: 12.9 g/dL — ABNORMAL LOW (ref 13.0–17.0)
Immature Granulocytes: 0 %
Lymphocytes Relative: 10 %
Lymphs Abs: 0.9 10*3/uL (ref 0.7–4.0)
MCH: 31.7 pg (ref 26.0–34.0)
MCHC: 32.3 g/dL (ref 30.0–36.0)
MCV: 98.3 fL (ref 80.0–100.0)
Monocytes Absolute: 0.6 10*3/uL (ref 0.1–1.0)
Monocytes Relative: 6 %
Neutro Abs: 7.4 10*3/uL (ref 1.7–7.7)
Neutrophils Relative %: 83 %
Platelets: 264 10*3/uL (ref 150–400)
RBC: 4.07 MIL/uL — ABNORMAL LOW (ref 4.22–5.81)
RDW: 12.4 % (ref 11.5–15.5)
WBC: 8.9 10*3/uL (ref 4.0–10.5)
nRBC: 0 % (ref 0.0–0.2)

## 2023-09-30 LAB — COMPREHENSIVE METABOLIC PANEL
ALT: 21 U/L (ref 0–44)
AST: 18 U/L (ref 15–41)
Albumin: 3.7 g/dL (ref 3.5–5.0)
Alkaline Phosphatase: 104 U/L (ref 38–126)
Anion gap: 9 (ref 5–15)
BUN: 24 mg/dL — ABNORMAL HIGH (ref 6–20)
CO2: 27 mmol/L (ref 22–32)
Calcium: 9.8 mg/dL (ref 8.9–10.3)
Chloride: 101 mmol/L (ref 98–111)
Creatinine, Ser: 1.35 mg/dL — ABNORMAL HIGH (ref 0.61–1.24)
GFR, Estimated: >60 mL/min (ref >60)
Glucose, Bld: 119 mg/dL — ABNORMAL HIGH (ref 70–99)
Potassium: 4.1 mmol/L (ref 3.5–5.1)
Sodium: 137 mmol/L (ref 135–145)
Total Bilirubin: 0.8 mg/dL (ref <1.2)
Total Protein: 7.2 g/dL (ref 6.5–8.1)

## 2023-09-30 LAB — TROPONIN I (HIGH SENSITIVITY): Troponin I (High Sensitivity): 4 ng/L (ref <18)

## 2023-09-30 LAB — D-DIMER, QUANTITATIVE
D-DIMER: 0.9 mg{FEU}/L — ABNORMAL HIGH (ref 0.00–0.49)
D-Dimer, Quant: 1.46 ug{FEU}/mL — ABNORMAL HIGH (ref 0.00–0.50)

## 2023-09-30 MED ORDER — FAMOTIDINE 40 MG PO TABS: 40.0000 mg | ORAL_TABLET | Freq: Every day | ORAL | 1 refills | Status: DC

## 2023-09-30 MED ORDER — SUCRALFATE 1 G PO TABS: 1.0000 g | ORAL_TABLET | Freq: Three times a day (TID) | ORAL | 0 refills | Status: DC

## 2023-09-30 MED ORDER — AZITHROMYCIN 250 MG PO TABS: ORAL_TABLET | ORAL | 0 refills | Status: DC

## 2023-09-30 MED ORDER — IOHEXOL 350 MG/ML SOLN
75.0000 mL | Freq: Once | INTRAVENOUS | Status: AC | PRN
  Administered 2023-09-30: 75 mL via INTRAVENOUS

## 2023-09-30 NOTE — ED Triage Notes (Signed)
Pt arrives to ED reporting abnormal D-dimer lab with asthma specialist. Pt recently c/o SOB x 1 weeks. Pt w/ hx of asthma, denies CP.

## 2023-09-30 NOTE — Telephone Encounter (Signed)
Called patient - DOB verified - advised of below provider notation.  Patient verbalized understanding to all, will go to nearest ER for further evaluation and treatment if needed.  Forwarding message to provider as updated.

## 2023-09-30 NOTE — Progress Notes (Unsigned)
FOLLOW UP  Date of Service/Encounter:  09/30/23   Assessment:   Moderate persistent asthma with acute exacerbation - alpha-1 antitrpysin and Aspergillus precipitins all normal  Shortness of breath - with elevated D-dimer (sending to the ED)    Non-allergic rhinitis   COVID19 long hauler - doing better on Tezspire   Bipolar disorder - on lithium    Chronic neck pain with surgery ten years ago and now with DDD   [employer]     Plan/Recommendations:   1. Moderate persistent asthma - with possible vocal cord dysfunction - Lung testing looks great today. - Subjective improvement in symptoms following nebulizer treatment in the office.  - I am going to add on azithromycin to treat presumed atypical pneumonia.  - We are going to get a D-dimer to look for a pulmonary embolism.  - We will send in a short low dose course of prednisone, but you are getting too much to overall which is concerning.  - Because of this, I want to make sure that you vocal cords are working appropriately.  - I think you might have something called vocal cord dysfunction that is making your asthma worse.  - We are going to send you to see ENT so they can look down at your throat and make sure that everything is functioning properly.  - Nebulizer treatment given today in clinic. - Nebulizer provided today.  - Daily controller medication(s): Singulair 10mg  daily and Trelegy 200/62.5/25 one puff once daily and Tezspire monthly - Prior to physical activity: ProAir 2 puffs 10-15 minutes before physical activity. - Rescue medications: ProAir 4 puffs every 4-6 hours as needed - Asthma control goals:  * Full participation in all desired activities (may need albuterol before activity) * Albuterol use two time or less a week on average (not counting use with activity) * Cough interfering with sleep two time or less a month * Oral steroids no more than once a year * No hospitalizations  2. GERD  -  Continue pantoprazole 40 mg daily. - Add on Pepcid 40mg  daily. - Add on Carafate 1 gm every 8 hours for one week.  - This might be making your breathing worse.   3. Non-allergic rhinitis - We will not make any medication changes at all.  - Continue with: Flonase (fluticasone) one spray per nostril daily   4. Follow up as scheduled.     Subjective:   Erik Ellis is a 57 y.o. male presenting today for follow up of  Chief Complaint  Patient presents with   Asthma    Cough, shortness of breath, difficulty talking, worse throughout the day     Erik Ellis has a history of the following: Patient Active Problem List   Diagnosis Date Noted   Stage 3a chronic kidney disease (HCC) 09/30/2022   Essential hypertension 09/24/2021   Hyperlipidemia 09/24/2021   Mild neurocognitive disorder due to multiple etiologies 03/20/2021   S/P cervical spinal fusion 02/21/2021   Moderate persistent asthma without complication 05/12/2020   Allergic rhinitis 02/17/2016   Bipolar disorder (HCC) 05/31/2015   Gastroesophageal reflux disease 05/31/2015   Sleep disorder 12/04/2011    History obtained from: chart review and patient.  Discussed the use of AI scribe software for clinical note transcription with the patient and/or guardian, who gave verbal consent to proceed.  Erik Ellis is a 57 y.o. male presenting for a follow up visit.  He was last seen in August 2024.  At  that time, we did not do lung testing.  He was endorsing symptoms consistent with an asthma exacerbation.  We gave him a sample of Trelegy.  We continue with Singulair as well as the Tezspire monthly.  For his nonallergic rhinitis, we continue with Flonase.  Since last visit, has has done well until the last couple of weeks.  Marcelous presents with a worsening of respiratory symptoms over the past two weeks. They describe an increase in coughing and shortness of breath, which they rate as the worst it has ever been. The patient has  been using an inhaler for symptom management, but reports it to be ineffective, leading to bouts of intense coughing post-use.  He has not had any fever.  There are no other sick contacts at home, but he is a Psychologist, occupational at a high school.  This is typically a terrible time of the year for his symptoms.  The patient has been adhering to their prescribed regimen of daily Trelegy for their asthma. They report that their symptoms often worsen in the spring and fall. They have not had any recent exposure to sick contacts at home or among their team members. They deny any known allergies to antibiotics. He remains on the Tezspire which was previously felt to be helping his asthma.   The patient also reports a sensation of fullness or bloating, which they describe as a 'burp.' They have not experienced this sensation in conjunction with their asthma before and are unsure if the two are related. They are currently on pantoprazole for their GERD.  He remains on his PPI for GERD.  He is compliant with this.  In addition to their respiratory symptoms, the patient reports a recent onset of leg weakness, which has been affecting their mobility. They deny any associated stomach pain or worsening of reflux symptoms. He is unsure if this is related at all to his other problems. This has been going on for a couple of weeks.  Otherwise, there have been no changes to his past medical history, surgical history, family history, or social history.    Review of systems otherwise negative other than that mentioned in the HPI.    Objective:   Blood pressure 136/86, pulse 92, temperature 98.3 F (36.8 C), resp. rate 16, height 5\' 7"  (1.702 m), weight 170 lb (77.1 kg), SpO2 98%. Body mass index is 26.63 kg/m.    Physical Exam Vitals reviewed.  Constitutional:      Appearance: He is well-developed.     Comments: Seems more somber than normal.  HENT:     Head: Normocephalic and atraumatic.     Right Ear: Tympanic  membrane, ear canal and external ear normal.     Left Ear: Tympanic membrane, ear canal and external ear normal.     Nose: No nasal deformity, septal deviation, mucosal edema or rhinorrhea.     Right Turbinates: Enlarged and swollen. Not pale.     Left Turbinates: Enlarged and swollen. Not pale.     Right Sinus: No maxillary sinus tenderness or frontal sinus tenderness.     Left Sinus: No maxillary sinus tenderness or frontal sinus tenderness.     Mouth/Throat:     Mouth: Mucous membranes are not pale and not dry.     Pharynx: Uvula midline.     Comments: Cobblestoning in the posterior oropharynx. Eyes:     General: Lids are normal. No allergic shiner.       Right eye: No discharge.  Left eye: No discharge.     Conjunctiva/sclera: Conjunctivae normal.     Right eye: Right conjunctiva is not injected. No chemosis.    Left eye: Left conjunctiva is not injected. No chemosis.    Pupils: Pupils are equal, round, and reactive to light.  Cardiovascular:     Rate and Rhythm: Normal rate and regular rhythm.     Heart sounds: Normal heart sounds.  Pulmonary:     Effort: Tachypnea present. No accessory muscle usage or respiratory distress.     Breath sounds: No transmitted upper airway sounds. Examination of the right-middle field reveals decreased breath sounds. Examination of the left-middle field reveals decreased breath sounds. Examination of the right-lower field reveals decreased breath sounds. Examination of the left-lower field reveals decreased breath sounds. Decreased breath sounds present. No wheezing, rhonchi or rales.     Comments: Moving air well in all lung fields.  No increased work of breathing.  Speaking in full sentences, but slightly tachypneic. Chest:     Chest wall: No tenderness.  Lymphadenopathy:     Cervical: No cervical adenopathy.  Skin:    Coloration: Skin is not pale.     Findings: No abrasion, erythema, petechiae or rash. Rash is not papular, urticarial or  vesicular.  Neurological:     Mental Status: He is alert.  Psychiatric:        Behavior: Behavior is cooperative.      Diagnostic studies:    Spirometry: results normal (FEV1: 3.33/102%, FVC: 3.83/91%, FEV1/FVC: 87%).    Spirometry consistent with normal pattern.   Nebulizer treatment given in the office with subjective improvement.   We did send a stat D-dimer to evaluate for pulmonary embolism.   Allergy Studies: none        Malachi Bonds, MD  [facility]

## 2023-09-30 NOTE — ED Provider Notes (Signed)
[place] EMERGENCY DEPARTMENT AT [facility] Provider Note  CSN: [id number] Arrival date & time: 09/30/23 1932  Chief Complaint(s) Abnormal Lab  HPI Erik Ellis is a 57 y.o. male history of bipolar disorder, asthma presenting to the emergency department with shortness of breath and abnormal lab.  He has been having shortness of breath, wheezing for the past week.  No chest pain.  No abdominal pain.  No fevers or chills.  He reports some dry cough.  Somewhat similar to previous asthma however was not improving.  No recent travel or surgeries.  No leg swelling.  No history of DVT or PE.   Past Medical History Past Medical History:  Diagnosis Date   Asthma    Depression    Hypertension    Patient Active Problem List   Diagnosis Date Noted   Stage 3a chronic kidney disease (HCC) 09/30/2022   Essential hypertension 09/24/2021   Hyperlipidemia 09/24/2021   Mild neurocognitive disorder due to multiple etiologies 03/20/2021   S/P cervical spinal fusion 02/21/2021   Moderate persistent asthma without complication 05/12/2020   Allergic rhinitis 02/17/2016   Bipolar disorder (HCC) 05/31/2015   Gastroesophageal reflux disease 05/31/2015   Sleep disorder 12/04/2011   Home Medication(s) Prior to Admission medications   Medication Sig Start Date End Date Taking? Authorizing Provider  albuterol (VENTOLIN HFA) 108 (90 Base) MCG/ACT inhaler Inhale 2 puffs into the lungs every 4 (four) hours as needed. For shortness of breath/wheezing 06/19/23  Yes Alfonse Spruce, MD  amLODipine (NORVASC) 5 MG tablet TAKE 1 TABLET (5 MG TOTAL) BY MOUTH DAILY. 09/15/23  Yes Cook, Jayce G, DO  amphetamine-dextroamphetamine (ADDERALL) 10 MG tablet Take 10 mg by mouth daily with breakfast. 07/24/16  Yes [provider]  amphetamine-dextroamphetamine (ADDERALL) 20 MG tablet Take 20 mg by mouth 2 (two) times daily. 09/08/23  Yes [provider]  Ascorbic Acid (VITAMIN C) 1000 MG  tablet Take 1,000 mg by mouth daily.   Yes [provider]  atorvastatin (LIPITOR) 20 MG tablet TAKE 1 TABLET BY MOUTH EVERY DAY 06/19/23  Yes Cook, Jayce G, DO  buPROPion (WELLBUTRIN XL) 150 MG 24 hr tablet Take 450 mg by mouth daily. 05/08/21  Yes [provider]  busPIRone (BUSPAR) 30 MG tablet Take 30 mg by mouth 2 (two) times daily. 07/24/14  Yes [provider]  cariprazine (VRAYLAR) capsule Take 1.5 mg by mouth daily.   Yes [provider]  famotidine (PEPCID) 40 MG tablet Take 1 tablet (40 mg total) by mouth daily. 09/30/23 12/29/23 Yes Alfonse Spruce, MD  fluticasone [facility]) 50 MCG/ACT nasal spray Place 1 spray into both nostrils daily. 03/27/23 09/29/24 Yes Alfonse Spruce, MD  gabapentin (NEURONTIN) 300 MG capsule Take 300 mg by mouth at bedtime. 03/24/16  Yes [provider]  lamoTRIgine (LAMICTAL) 200 MG tablet Take 200 mg by mouth 2 (two) times daily. 08/14/14  Yes [provider]  lithium carbonate (LITHOBID) 300 MG ER tablet Take 300 mg by mouth in the morning, at noon, and at bedtime. 09/06/23  Yes [provider]  montelukast (SINGULAIR) 10 MG tablet Take 1 tablet (10 mg total) by mouth at bedtime. 06/19/23 09/29/24 Yes Alfonse Spruce, MD  Multiple Vitamins-Minerals (MULTIVITAMIN WITH MINERALS) tablet Take 1 tablet by mouth daily.   Yes [provider]  Omega-3 Fatty Acids (FISH OIL) 1000 MG CAPS Take 2,000 mg by mouth daily.   Yes [provider]  pantoprazole (PROTONIX) 40 MG  tablet TAKE 1 TABLET BY MOUTH EVERY DAY 07/02/23  Yes Cook, Jayce G, DO  predniSONE (DELTASONE) 20 MG tablet Take 2 tablets (40 mg total) by mouth daily with breakfast for 4 days. 10/01/23 10/05/23 Yes Rancour, Jeannett Senior, MD  Tezepelumab-ekko (TEZSPIRE) 210 MG/1. SOAJ Inject 210 mg into the skin every 28 (twenty-eight) days. 02/25/23  Yes Kozlow, Alvira Philips, MD  traZODone (DESYREL) 50 MG tablet Take 50 mg by mouth at  bedtime as needed for sleep. 09/02/23  Yes [provider]  Dwyane Luo 200-62.5-25 MCG/ACT AEPB Inhale 1 puff into the lungs daily. 06/19/23  Yes Alfonse Spruce, MD  XIIDRA 5 % SOLN Place 1 drop into both eyes in the morning and at bedtime. 08/13/18  Yes [provider]  zolpidem (AMBIEN) 10 MG tablet Take 5-10 mg by mouth at bedtime.  07/26/14  Yes [provider]  azithromycin (ZITHROMAX) 250 MG tablet Take two tablets on day one and then one tablet daily for four more days. 09/30/23   Alfonse Spruce, MD  sucralfate (CARAFATE) 1 g tablet Take 1 tablet (1 g total) by mouth 4 (four) times daily -  with meals and at bedtime. 09/30/23 10/30/23  Alfonse Spruce, MD                                                                                                                                    Past Surgical History Past Surgical History:  Procedure Laterality Date   CHOLECYSTECTOMY     KNEE ARTHROSCOPY     LUMBAR DISC SURGERY     NECK SURGERY     POSTERIOR CERVICAL FUSION/FORAMINOTOMY N/A 02/21/2021   Procedure: Posterior cervical fusion with lateral mass fixation - Cervical Ficve-Six/Cervical Six-Seven;  Surgeon: Tia Alert, MD;  Location: [facility] OR;  Service: Neurosurgery;  Laterality: N/A;   REPLACEMENT TOTAL KNEE     Family History Family History  Problem Relation Age of Onset   Asthma Father    Allergic rhinitis Neg Hx    Angioedema Neg Hx    Atopy Neg Hx    Eczema Neg Hx    Immunodeficiency Neg Hx    Urticaria Neg Hx    Thyroid disease Neg Hx     Social History Social History   Tobacco Use   Smoking status: Never   Smokeless tobacco: Never  Vaping Use   Vaping status: Never Used  Substance Use Topics   Alcohol use: Not Currently   Drug use: No   Allergies Patient has no known allergies.  Review of Systems Review of Systems  All other systems reviewed and are negative.   Physical Exam Vital Signs  I have reviewed  the triage vital signs BP (!) 143/78   Pulse 89   Temp 98.8 F (37.1 C)   Resp (!) 25   Ht 5\' 7"  (1.702 m)   Wt 76.2 kg   SpO2 100%  BMI 26.31 kg/m  Physical Exam Vitals and nursing note reviewed.  Constitutional:      General: He is not in acute distress.    Appearance: Normal appearance.  HENT:     Mouth/Throat:     Mouth: Mucous membranes are moist.  Eyes:     Conjunctiva/sclera: Conjunctivae normal.  Cardiovascular:     Rate and Rhythm: Normal rate and regular rhythm.  Pulmonary:     Effort: Pulmonary effort is normal. No respiratory distress.     Breath sounds: Normal breath sounds.  Abdominal:     General: Abdomen is flat.     Palpations: Abdomen is soft.     Tenderness: There is no abdominal tenderness.  Musculoskeletal:     Right lower leg: No edema.     Left lower leg: No edema.  Skin:    General: Skin is warm and dry.     Capillary Refill: Capillary refill takes less than 2 seconds.  Neurological:     Mental Status: He is alert and oriented to person, place, and time. Mental status is at baseline.  Psychiatric:        Mood and Affect: Mood normal.        Behavior: Behavior normal.     ED Results and Treatments Labs (all labs ordered are listed, but only abnormal results are displayed) Labs Reviewed  COMPREHENSIVE METABOLIC PANEL - Abnormal; Notable for the following components:      Result Value   Glucose, Bld 119 (*)    BUN 24 (*)    Creatinine, Ser 1.35 (*)    All other components within normal limits  D-DIMER, QUANTITATIVE - Abnormal; Notable for the following components:   D-Dimer, Quant 1.46 (*)    All other components within normal limits  CBC WITH DIFFERENTIAL/PLATELET - Abnormal; Notable for the following components:   RBC 4.07 (*)    Hemoglobin 12.9 (*)    All other components within normal limits  CBG MONITORING, ED - Abnormal; Notable for the following components:   Glucose-Capillary 118 (*)    All other components within normal  limits  TROPONIN I (HIGH SENSITIVITY)                                                                                                                          Radiology CT Angio Chest PE W and/or Wo Contrast  Result Date: 09/30/2023 CLINICAL DATA:  High probability pulmonary embolism, elevated D-dimer, dyspnea EXAM: CT ANGIOGRAPHY CHEST WITH CONTRAST TECHNIQUE: Multidetector CT imaging of the chest was performed using the standard protocol during bolus administration of intravenous contrast. Multiplanar CT image reconstructions and MIPs were obtained to evaluate the vascular anatomy. RADIATION DOSE REDUCTION: This exam was performed according to the departmental dose-optimization program which includes automated exposure control, adjustment of the mA and/or kV according to patient size and/or use of iterative reconstruction technique. CONTRAST:  75mL OMNIPAQUE IOHEXOL 350 MG/ML SOLN COMPARISON:  None Available. FINDINGS: Cardiovascular: Adequate opacification of  the pulmonary arterial tree. No intraluminal filling defect identified to suggest acute pulmonary embolism. Central pulmonary arteries are of normal caliber. No significant coronary artery calcification. Cardiac size within normal limits. No pericardial effusion. No significant atherosclerotic calcification within the thoracic aorta. No aortic aneurysm. Mediastinum/Nodes: No enlarged mediastinal, hilar, or axillary lymph nodes. Thyroid gland, trachea, and esophagus demonstrate no significant findings. Lungs/Pleura: Bibasilar dependent atelectasis. Lungs are otherwise clear. No pneumothorax or pleural effusion. No central obstructing lesion. Upper Abdomen: No acute abnormality. Musculoskeletal: No chest wall abnormality. No acute or significant osseous findings. Review of the MIP images confirms the above findings. IMPRESSION: 1. No pulmonary embolism. No acute intrathoracic pathology identified. Electronically Signed   By: Helyn Numbers M.D.   On:  09/30/2023 23:54   DG Chest 2 View  Result Date: 09/30/2023 CLINICAL DATA:  Shortness of breath EXAM: CHEST - 2 VIEW COMPARISON:  02/21/2021 FINDINGS: The heart size and mediastinal contours are within normal limits. Both lungs are clear. The visualized skeletal structures are unremarkable. IMPRESSION: No active cardiopulmonary disease. Electronically Signed   By: Minerva Fester M.D.   On: 09/30/2023 21:45    Pertinent labs & imaging results that were available during my care of the patient were reviewed by me and considered in my medical decision making (see MDM for details).  Medications Ordered in ED Medications  iohexol (OMNIPAQUE) 350 MG/ML injection 75 mL (75 mLs Intravenous Contrast Given 09/30/23 2225)  predniSONE (DELTASONE) tablet 60 mg (60 mg Oral Given 10/01/23 0053)  ondansetron (ZOFRAN-ODT) disintegrating tablet 4 mg (4 mg Oral Given 10/01/23 0209)                                                                                                                                     Procedures Procedures  (including critical care time)  Medical Decision Making / ED Course   MDM:  57 year old presenting to the emergency department with shortness of breath.  Patient overall well-appearing, no hypoxia, no tachypnea.  To me his lungs are clear on exam.  He has no signs of DVT on clinical examination.  D-dimer was resent when patient was in triage and still elevated, obtain CT chest which is pending to rule out pulmonary embolism.  This would also evaluate for any other pulmonary pathology such as pneumonia, pneumothorax.  Symptoms seem more consistent with his chronic asthma.  He has been following up with allergy and immunology for this.  If his CT scan is negative, anticipate discharge with continued outpatient follow-up.  Clinical Course as of 10/01/23 1832  Tue Sep 30, 2023  2326 Signed out to Dr. Manus Gunning pending CT chest.  [WS]    Clinical Course User Index [WS]  Lonell Grandchild, MD     Additional history obtained: -Additional history obtained from spouse -External records from outside source obtained and reviewed including: Chart review including previous notes, labs, imaging, consultation notes including allergist note  Lab Tests: -I ordered, reviewed, and interpreted labs.   The pertinent results include:   Labs Reviewed  COMPREHENSIVE METABOLIC PANEL - Abnormal; Notable for the following components:      Result Value   Glucose, Bld 119 (*)    BUN 24 (*)    Creatinine, Ser 1.35 (*)    All other components within normal limits  D-DIMER, QUANTITATIVE - Abnormal; Notable for the following components:   D-Dimer, Quant 1.46 (*)    All other components within normal limits  CBC WITH DIFFERENTIAL/PLATELET - Abnormal; Notable for the following components:   RBC 4.07 (*)    Hemoglobin 12.9 (*)    All other components within normal limits  CBG MONITORING, ED - Abnormal; Notable for the following components:   Glucose-Capillary 118 (*)    All other components within normal limits  TROPONIN I (HIGH SENSITIVITY)    Notable for elevated d-dimer, mild anemia, CKD   Imaging Studies ordered: I ordered imaging studies including CT PE  On my interpretation imaging demonstrates no PE  I independently visualized and interpreted imaging. I agree with the radiologist interpretation   Medicines ordered and prescription drug management: Meds ordered this encounter  Medications   iohexol (OMNIPAQUE) 350 MG/ML injection 75 mL   predniSONE (DELTASONE) tablet 60 mg   predniSONE (DELTASONE) 20 MG tablet    Sig: Take 2 tablets (40 mg total) by mouth daily with breakfast for 4 days.    Dispense:  8 tablet    Refill:  0   ondansetron (ZOFRAN-ODT) disintegrating tablet 4 mg    -I have reviewed the patients home medicines and have made adjustments as needed   Co morbidities that complicate the patient evaluation  Past Medical History:   Diagnosis Date   Asthma    Depression    Hypertension       Dispostion: Disposition decision including need for hospitalization was considered, and patient disposition pending at time of sign out.    Final Clinical Impression(s) / ED Diagnoses Final diagnoses:  Elevated d-dimer     This chart was dictated using voice recognition software.  Despite best efforts to proofread,  errors can occur which can change the documentation meaning.    Lonell Grandchild, MD 10/01/23 [phone]

## 2023-09-30 NOTE — Progress Notes (Unsigned)
I received a call from [facility].  His D-dimer was elevated at 0.90.  Because of his clinical state and elevated D-dimer, I would like him to go to the emergency room for an evaluation.   Malachi Bonds, MD [facility]

## 2023-09-30 NOTE — Patient Instructions (Addendum)
1. Moderate persistent asthma - with possible vocal cord dysfunction - Lung testing looks great today. - I am going to add on azithromycin to treat presumed atypical pneumonia.  - We are going to get a D-dimer to look for a pulmonary embolism.  - We will send in a short low dose course of prednisone, but you are getting too much to overall which is concerning.  - Because of this, I want to make sure that you vocal cords are working appropriately.  - I think you might have something called vocal cord dysfunction that is making your asthma worse.  - We are going to send you to see ENT so they can look down at your throat and make sure that everything is functioning properly.  - Nebulizer treatment given today in clinic. - Nebulizer provided today.  - Daily controller medication(s): Singulair 10mg  daily and Trelegy 200/62.5/25 one puff once daily and Tezspire monthly - Prior to physical activity: ProAir 2 puffs 10-15 minutes before physical activity. - Rescue medications: ProAir 4 puffs every 4-6 hours as needed - Asthma control goals:  * Full participation in all desired activities (may need albuterol before activity) * Albuterol use two time or less a week on average (not counting use with activity) * Cough interfering with sleep two time or less a month * Oral steroids no more than once a year * No hospitalizations  2. GERD  - Continue pantoprazole 40 mg daily. - Add on Pepcid 40mg  daily. - Add on Carafate 1 gm every 8 hours for one week.  - This might be making your breathing worse.   3. Non-allergic rhinitis - We will not make any medication changes at all.  - Continue with: Flonase (fluticasone) one spray per nostril daily   4. Follow up as scheduled.     Please inform us of any Emergency Department visits, hospitalizations, or changes in symptoms. Call us before going to the ED for breathing or allergy symptoms since we might be able to fit you in for a sick visit. Feel free to  contact us anytime with any questions, problems, or concerns.  It was a pleasure to see you again today!  Websites that have reliable patient information: 1. American Academy of Asthma, Allergy, and Immunology: www.aaaai.org 2. Food Allergy Research and Education (FARE): foodallergy.org 3. Mothers of Asthmatics: http://www.asthmacommunitynetwork.org 4. American College of Allergy, Asthma, and Immunology: www.acaai.org   COVID-19 Vaccine Information can be found at: [email or web address] For questions related to vaccine distribution or appointments, please email vaccine@ [email or web address] or call [phone].   We realize that you might be concerned about having an allergic reaction to the COVID19 vaccines. To help with that concern, WE ARE OFFERING THE COVID19 VACCINES IN OUR OFFICE! Ask the front desk for dates!     "Like" [place] on Facebook and Instagram for our latest updates!      A healthy democracy works best when [employer] participate! Make sure you are registered to vote! If you have moved or changed any of your contact information, you will need to get this updated before voting!  In some cases, you MAY be able to register to vote online: [email or web address]    Side Effects of Steroids

## 2023-09-30 NOTE — Telephone Encounter (Signed)
-----   Message from Alfonse Spruce sent at 09/30/2023  6:34 PM EST ----- Can someone call him and let him know that his marker for a pulmonary embolism was elevated? He should go to the emergency department tonight.

## 2023-10-01 ENCOUNTER — Encounter: Payer: Self-pay | Admitting: Allergy & Immunology

## 2023-10-01 ENCOUNTER — Telehealth: Payer: Self-pay | Admitting: *Deleted

## 2023-10-01 ENCOUNTER — Other Ambulatory Visit: Payer: Self-pay | Admitting: *Deleted

## 2023-10-01 LAB — CBG MONITORING, ED: Glucose-Capillary: 118 mg/dL — ABNORMAL HIGH (ref 70–99)

## 2023-10-01 MED ORDER — PREDNISONE 20 MG PO TABS: 40.0000 mg | ORAL_TABLET | Freq: Every day | ORAL | 0 refills | Status: AC

## 2023-10-01 MED ORDER — PREDNISONE 20 MG PO TABS
60.0000 mg | ORAL_TABLET | Freq: Once | ORAL | Status: AC
  Administered 2023-10-01: 60 mg via ORAL
  Filled 2023-10-01: qty 3

## 2023-10-01 MED ORDER — ONDANSETRON 4 MG PO TBDP
4.0000 mg | ORAL_TABLET | Freq: Once | ORAL | Status: AC
  Administered 2023-10-01: 4 mg via ORAL
  Filled 2023-10-01: qty 1

## 2023-10-01 NOTE — Telephone Encounter (Signed)
Called and left a voicemail asking for patient tor return call to discuss.

## 2023-10-01 NOTE — Telephone Encounter (Signed)
Refer to RESULTS Telephone encounter 09/30/23.

## 2023-10-01 NOTE — ED Notes (Signed)
Prior to patient leaving/being discharged. Pt reported feeling "queasy." Pt showing increased work of breathing. EDP notified of situation.

## 2023-10-01 NOTE — ED Provider Notes (Addendum)
Care assumed from Dr. Vernia Buff pending CT angiogram.g found to have elevated D-dimer at allergist office today.  CT is negative for pulmonary embolism or pneumonia or other acute abnormalities.  Was given antibiotics from his allergist today as well as Carafate, famotidine. Patient wondering why he was not prescribed prednisone.  Allergist note is not complete.  Patient was able to ambulate without desaturation.  He feels improved.  He has his medications at home.  He will be given a short course of prednisone but asked to call his allergist in the morning to see if he should complete this or not.  No difficulty with ambulation.   Glynn Octave, MD 10/01/23 0144  On Attempted discharge, patient began to feel queasy, diaphoretic and short of breath.  He is thinks this was in relation to seeing blood come out of his IV.  CBG and EKG repeated  CBG is normal.  Repeat EKG is unchanged.  Patient feels better after resting for a few moments.  Suspect likely vasovagal reaction to seeing blood.   Glynn Octave, MD 10/01/23 [phone]

## 2023-10-01 NOTE — Telephone Encounter (Signed)
Great - thanks for following up!  ?

## 2023-10-01 NOTE — Telephone Encounter (Signed)
Patient called back and stated that he went to the hospital and he has no blood clot or no pneumonia. Patient states that he is tired but he is feeling today. He states that he is feeling some better today.

## 2023-10-01 NOTE — Discharge Instructions (Signed)
Your testing is negative for pneumonia or blood clot in the lung.  Take your medications as prescribed.  Call your allergist to see if you should take the prednisone that was prescribed.  Return to the ED with chest pain, shortness of breath, nausea, vomiting, sweating or other concerns.

## 2023-10-01 NOTE — ED Notes (Signed)
Pt SpO2 remained between 99-95% while ambulating. Pt pulse between 88-91. Pt denies shortness of breath and/or dizziness.

## 2023-10-01 NOTE — ED Notes (Signed)
This RN reviewed discharge instructions with patient. He verbalized understanding and denied any further questions. PT well appearing upon discharge and reports no pain. Pt ambulated with stable gait to exit w/ family . Pt endorses ride home.

## 2023-10-01 NOTE — Telephone Encounter (Signed)
-----   Message from Alfonse Spruce sent at 10/01/2023  6:44 AM EST ----- Can someone call and see how the patient is doing today?

## 2023-10-06 ENCOUNTER — Ambulatory Visit: Payer: BC Managed Care – PPO | Admitting: Internal Medicine

## 2023-10-06 ENCOUNTER — Ambulatory Visit: Payer: BC Managed Care – PPO | Admitting: Family Medicine

## 2023-10-06 ENCOUNTER — Encounter: Payer: Self-pay | Admitting: Family Medicine

## 2023-10-06 ENCOUNTER — Encounter: Payer: Self-pay | Admitting: Internal Medicine

## 2023-10-06 ENCOUNTER — Other Ambulatory Visit: Payer: Self-pay

## 2023-10-06 VITALS — BP 122/70 | HR 101 | Temp 98.4°F | Resp 16

## 2023-10-06 DIAGNOSIS — J310 Chronic rhinitis: Secondary | ICD-10-CM

## 2023-10-06 DIAGNOSIS — E785 Hyperlipidemia, unspecified: Secondary | ICD-10-CM | POA: Diagnosis not present

## 2023-10-06 DIAGNOSIS — J4540 Moderate persistent asthma, uncomplicated: Secondary | ICD-10-CM | POA: Diagnosis not present

## 2023-10-06 DIAGNOSIS — K219 Gastro-esophageal reflux disease without esophagitis: Secondary | ICD-10-CM

## 2023-10-06 DIAGNOSIS — I10 Essential (primary) hypertension: Secondary | ICD-10-CM | POA: Diagnosis not present

## 2023-10-06 DIAGNOSIS — J4550 Severe persistent asthma, uncomplicated: Secondary | ICD-10-CM

## 2023-10-06 MED ORDER — ALBUTEROL SULFATE HFA 108 (90 BASE) MCG/ACT IN AERS: 2.0000 | INHALATION_SPRAY | RESPIRATORY_TRACT | 1 refills | Status: AC | PRN

## 2023-10-06 MED ORDER — ALBUTEROL SULFATE (2.5 MG/3ML) 0.083% IN NEBU: 2.5000 mg | INHALATION_SOLUTION | Freq: Four times a day (QID) | RESPIRATORY_TRACT | 1 refills | Status: AC | PRN

## 2023-10-06 NOTE — Patient Instructions (Signed)
Continue your medications.  Follow up in 6 months.  Take care  Dr. Avelino Herren  

## 2023-10-06 NOTE — Progress Notes (Signed)
   FOLLOW UP Date of Service/Encounter:  10/06/23   Subjective:  Erik Ellis (DOB: 05-May-1966) is a 57 y.o. male who [facility] for follow up for asthma.   History obtained from: chart review and patient. Recently seen by Dr Dellis Anes last week for acute cough/SOB.  Had normal spirometry, elevated D dimer; sent to the ER.  CT PE protocol was negative. Discussed starting azithromycin, prednisone, nebulizer treatment PRN. Also on Singulair/Trelegy/Tezspire. Also started on reflux meds- carafate/pepcid.    Since last visit, reports he is slowly getting better. Still has a cough especially worse at nighttime.  Not as much SOB.  Denies any wheezing. He had an old prescription of prednisone that reports was dispensed by pharmacy and it is a taper; started Tuesday night and has 3 more days left of it.  Would like Albuterol nebulizer Rx sent also. Taking Singulair and Trelegy daily.  Has started carafate and Pepcid for reflux in addition to his PPI.     Past Medical History: Past Medical History:  Diagnosis Date   Asthma    Depression    Hypertension     Objective:  BP 122/70   Pulse (!) 101   Temp 98.4 F (36.9 C)   Resp 16   SpO2 97%  There is no height or weight on file to calculate BMI. Physical Exam: GEN: alert, well developed HEENT: clear conjunctiva, nose with mild inferior turbinate hypertrophy, pink nasal mucosa, no rhinorrhea HEART: regular rate and rhythm, no murmur LUNGS: clear to auscultation bilaterally, no coughing, unlabored respiration SKIN: no rashes or lesions  Assessment:   1. Severe persistent asthma without complication   2. Gastroesophageal reflux disease, unspecified whether esophagitis present   3. Chronic rhinitis     Plan/Recommendations:  1. Moderate persistent asthma - with possible vocal cord dysfunction - Recent exacerbation.  No wheezing on exam and symptoms slowly improving.  Finish course of  prednisone. Can use honey tea, cough drops, humidifier to help with lingering cough; discussed this could linger for 6-8 weeks.     - Daily controller medication(s): Singulair 10mg  daily and Trelegy 200/62.5/25 one puff once daily and Tezspire monthly - Prior to physical activity: ProAir 2 puffs 10-15 minutes before physical activity. - Rescue medications: Albuterol 1-2 puffs or 1 vial nebulized every 4-6 hours as needed for wheezing/shortness of breath.  - Asthma control goals:  * Full participation in all desired activities (may need albuterol before activity) * Albuterol use two time or less a week on average (not counting use with activity) * Cough interfering with sleep two time or less a month * Oral steroids no more than once a year * No hospitalizations  2. GERD  - Continue Pantoprazole 40 mg daily. - Use Pepcid 40mg  daily.   - Use Carafate for 1 week and can stop after.   3. Chronic Rhinitis - SPT 09/2018: negative  - Continue with: Flonase (fluticasone) one spray per nostril daily    Follow up with Dr. Dellis Anes as scheduled 12/23/2023.      No follow-ups on file.  Alesia Morin, MD [facility]

## 2023-10-06 NOTE — Patient Instructions (Addendum)
1. Moderate persistent asthma - with possible vocal cord dysfunction - Finish course of prednisone. - Can use honey tea, cough drops, humidifier to help with lingering cough.   - Daily controller medication(s): Singulair 10mg  daily and Trelegy 200/62.5/25 one puff once daily and Tezspire monthly - Prior to physical activity: ProAir 2 puffs 10-15 minutes before physical activity. - Rescue medications: Albuterol 1-2 puffs or 1 vial nebulized every 4-6 hours as needed for wheezing/shortness of breath.  - Asthma control goals:  * Full participation in all desired activities (may need albuterol before activity) * Albuterol use two time or less a week on average (not counting use with activity) * Cough interfering with sleep two time or less a month * Oral steroids no more than once a year * No hospitalizations  2. GERD  - Continue Pantoprazole 40 mg daily. - Use Pepcid 40mg  daily.   - Use Carafate for 1 week.    3. Chronic Rhinitis - Continue with: Flonase (fluticasone) one spray per nostril daily    Follow up with Dr. Dellis Anes as scheduled 12/23/2023.

## 2023-10-07 MED ORDER — AMLODIPINE BESYLATE 5 MG PO TABS: 5.0000 mg | ORAL_TABLET | Freq: Every day | ORAL | 3 refills | Status: DC

## 2023-10-07 MED ORDER — ATORVASTATIN CALCIUM 20 MG PO TABS: 20.0000 mg | ORAL_TABLET | Freq: Every day | ORAL | 3 refills | Status: DC

## 2023-10-07 NOTE — Progress Notes (Signed)
Subjective:  Patient ID: Erik Ellis, male    DOB: Jan 07, 1966  Age: 57 y.o. MRN: [id number]  CC:   Chief Complaint  Patient presents with   elevated D Dimer ER follow up    HPI:  57 year old male with the below mentioned medical problems presents for ER follow-up.  Patient has had ongoing issues with his asthma.  Has recently been seen by allergist.  A D-dimer was obtained and was elevated prompting him to go to the ER.  Workup in the ER was negative.  He reports that he is doing better but is still having difficulty with his asthma.  He said chest tightness and shortness of breath.  He is currently on corticosteroids in addition to Trelegy, Singulair, and Tezspire.   Hypertension is stable on amlodipine.  Lipids at goal on Lipitor.  Patient Active Problem List   Diagnosis Date Noted   Stage 3a chronic kidney disease (HCC) 09/30/2022   Essential hypertension 09/24/2021   Hyperlipidemia 09/24/2021   Mild neurocognitive disorder due to multiple etiologies 03/20/2021   S/P cervical spinal fusion 02/21/2021   Moderate persistent asthma without complication 05/12/2020   Allergic rhinitis 02/17/2016   Bipolar disorder (HCC) 05/31/2015   Gastroesophageal reflux disease 05/31/2015   Sleep disorder 12/04/2011    Social Hx   Social History   Socioeconomic History   Marital status: Married    Spouse name: Not on file   Number of children: Not on file   Years of education: Not on file   Highest education level: Bachelor's degree (e.g., BA, AB, BS)  Occupational History   Not on file  Tobacco Use   Smoking status: Never   Smokeless tobacco: Never  Vaping Use   Vaping status: Never Used  Substance and Sexual Activity   Alcohol use: Not Currently   Drug use: No   Sexual activity: Not on file  Other Topics Concern   Not on file  Social History Narrative   Not on file   Social Determinants of Health   Financial Resource Strain: Low Risk  (04/01/2023)   Overall  Financial Resource Strain (CARDIA)    Difficulty of Paying Living Expenses: Not hard at all  Food Insecurity: No Food Insecurity (04/01/2023)   Hunger Vital Sign    Worried About Running Out of Food in the Last Year: Never true    Ran Out of Food in the Last Year: Never true  Transportation Needs: No Transportation Needs (04/01/2023)   PRAPARE - Administrator, Civil Service (Medical): No    Lack of Transportation (Non-Medical): No  Physical Activity: Sufficiently Active (04/01/2023)   Exercise Vital Sign    Days of Exercise per Week: 5 days    Minutes of Exercise per Session: 30 min  Stress: No Stress Concern Present (04/01/2023)   [employer] of Occupational Health - Occupational Stress Questionnaire    Feeling of Stress : Only a little  Social Connections: Moderately Integrated (04/01/2023)   Social Connection and Isolation Panel [NHANES]    Frequency of Communication with Friends and Family: Once a week    Frequency of Social Gatherings with Friends and Family: Once a week    Attends Religious Services: More than 4 times per year    Active Member of [employer] or Organizations: Yes    Attends Banker Meetings: Not on file    Marital Status: Married    Review of Systems Per HPI  Objective:  BP 126/76  Pulse 77   Temp (!) 97.3 F (36.3 C)   Ht 5\' 7"  (1.702 m)   Wt 169 lb (76.7 kg)   SpO2 98%   BMI 26.47 kg/m      10/06/2023    2:50 PM 10/06/2023   10:41 AM 10/01/2023    3:44 AM  BP/Weight  Systolic BP 126 122 143  Diastolic BP 76 70 78  Wt. (Lbs) 169    BMI 26.47 kg/m2      Physical Exam Vitals and nursing note reviewed.  Constitutional:      General: He is not in acute distress.    Appearance: Normal appearance.  HENT:     Head: Normocephalic and atraumatic.  Eyes:     General:        Right eye: No discharge.        Left eye: No discharge.     Conjunctiva/sclera: Conjunctivae normal.  Cardiovascular:     Rate and Rhythm: Normal  rate and regular rhythm.  Pulmonary:     Effort: Pulmonary effort is normal.     Breath sounds: Normal breath sounds. No wheezing or rales.  Neurological:     Mental Status: He is alert.     Lab Results  Component Value Date   WBC 8.9 09/30/2023   HGB 12.9 (L) 09/30/2023   HCT 40.0 09/30/2023   PLT 264 09/30/2023   GLUCOSE 119 (H) 09/30/2023   CHOL 126 03/17/2023   TRIG 118 03/17/2023   HDL 46 03/17/2023   LDLCALC 59 03/17/2023   ALT 21 09/30/2023   AST 18 09/30/2023   NA 137 09/30/2023   K 4.1 09/30/2023   CL 101 09/30/2023   CREATININE 1.35 (H) 09/30/2023   BUN 24 (H) 09/30/2023   CO2 27 09/30/2023   TSH 0.963 03/17/2023   INR 1.0 02/21/2021   HGBA1C 5.4 01/08/2022     Assessment & Plan:   Problem List Items Addressed This Visit       Cardiovascular and Mediastinum   Essential hypertension    Well-controlled.  Continue amlodipine.      Relevant Medications   atorvastatin (LIPITOR) 20 MG tablet   amLODipine (NORVASC) 5 MG tablet     Respiratory   Moderate persistent asthma without complication    Patient following closely with allergy.  Currently stable.  Continue current medications.        Other   Hyperlipidemia    At goal.  Continue Lipitor.      Relevant Medications   atorvastatin (LIPITOR) 20 MG tablet   amLODipine (NORVASC) 5 MG tablet    Meds ordered this encounter  Medications   atorvastatin (LIPITOR) 20 MG tablet    Sig: Take 1 tablet (20 mg total) by mouth daily.    Dispense:  90 tablet    Refill:  3   amLODipine (NORVASC) 5 MG tablet    Sig: Take 1 tablet (5 mg total) by mouth daily.    Dispense:  90 tablet    Refill:  3    Follow-up:  6 months  Morgan Rennert Adriana Simas DO [facility]

## 2023-10-07 NOTE — Assessment & Plan Note (Signed)
At goal. Continue Lipitor. 

## 2023-10-07 NOTE — Assessment & Plan Note (Signed)
Patient following closely with allergy.  Currently stable.  Continue current medications.

## 2023-10-07 NOTE — Assessment & Plan Note (Signed)
Well controlled. Continue amlodipine 

## 2023-10-08 ENCOUNTER — Ambulatory Visit: Payer: BC Managed Care – PPO | Admitting: Family Medicine

## 2023-10-10 ENCOUNTER — Encounter (INDEPENDENT_AMBULATORY_CARE_PROVIDER_SITE_OTHER): Payer: Self-pay | Admitting: Otolaryngology

## 2023-10-13 ENCOUNTER — Ambulatory Visit: Payer: BC Managed Care – PPO | Admitting: Family Medicine

## 2023-10-22 ENCOUNTER — Other Ambulatory Visit: Payer: Self-pay | Admitting: Allergy & Immunology

## 2023-10-23 ENCOUNTER — Ambulatory Visit: Payer: Self-pay | Admitting: Family Medicine

## 2023-10-23 ENCOUNTER — Ambulatory Visit: Payer: BC Managed Care – PPO | Admitting: Physician Assistant

## 2023-10-23 VITALS — BP 122/78 | Ht 67.0 in | Wt 170.6 lb

## 2023-10-23 DIAGNOSIS — B370 Candidal stomatitis: Secondary | ICD-10-CM

## 2023-10-23 MED ORDER — NYSTATIN 100000 UNIT/ML MT SUSP: 5.0000 mL | Freq: Four times a day (QID) | OROMUCOSAL | 0 refills | Status: AC

## 2023-10-23 MED ORDER — FLUCONAZOLE 150 MG PO TABS: 150.0000 mg | ORAL_TABLET | ORAL | 0 refills | Status: DC

## 2023-10-23 MED ORDER — NYSTATIN 100000 UNIT/ML MT SUSP: 5.0000 mL | Freq: Four times a day (QID) | OROMUCOSAL | 0 refills | Status: DC

## 2023-10-23 NOTE — Addendum Note (Signed)
Addended by: Olga Millers on: 10/23/2023 03:42 PM   Modules accepted: Orders

## 2023-10-23 NOTE — Telephone Encounter (Signed)
  Chief Complaint: Tongue swelling Symptoms: swelling Frequency: 5 days Pertinent Negatives: Patient denies SOB, inability to control secretions, difficulty swallowing Disposition: [] ED /[] Urgent Care (no appt availability in office) / [] Appointment(In office/virtual)/ []  [facility]/ [] Home Care/ [x] Refused Recommended Disposition /[] [facility]/ []  Follow-up with PCP Additional Notes: Pt called stating he has had swelling on the sides and back of his tongue for 5 days. Pt denies SOB, difficulty swallowing, trouble controlling secretions, new medications, or injury to tongue. Pt states he is unsure what may be causing it, that it just feels weird. Per protocol, pt to go to ED now. Pt refused, requesting call back from office. States he thinks antibiotics can fix it. Care advice reviewed, pt verbalized understanding. Alerting PCP for review.   Copied from CRM [id number]. Topic: Clinical - Red Word Triage >> Oct 23, 2023 11:22 AM Erik Ellis wrote: Red Word that prompted transfer to Nurse Triage: Allergic reaction, experiencing swelling on tongue resulting in uncomfortableness. Reason for Disposition  All other adults with tongue swelling  (Exception: Tongue swelling is a recurrent problem AND NO swelling at present.)  Answer Assessment - Initial Assessment Questions 1. ONSET: "When did the swelling start?" (e.g., minutes, hours, days)     5 days 2. LOCATION: "What part of the tongue is swollen?"     Side in back, bilaterally 3. SEVERITY: "How swollen is it?"     Feels like whole tongue does not look normal, swollen just on sides and in back 4. CAUSE: "What do you think is causing the tongue swelling?" (e.g., history of angioedema, allergies)     Unsure 5. RECURRENT SYMPTOM: "Have you had tongue swelling before?" If Yes, ask: "When was the last time?" "What happened that time?"     Denies 6. OTHER SYMPTOMS: "Do you have any other symptoms?" (e.g., breathing difficulty,  facial swelling)     Denies  Protocols used: Tongue Swelling-A-AH

## 2023-10-23 NOTE — Progress Notes (Signed)
Acute Office Visit  Subjective:     Patient ID: Erik Ellis, male    DOB: May 20, 1966, 57 y.o.   MRN: [id number]   HPI Patient is in today for tongue irritation.  He states symptoms began on Sunday.  Patient describes symptoms as "weird feeling".  He denies symptoms concerning for allergic reaction such as shortness of breath, new rash, difficulty breathing, or swelling.  He states his mouth feels dry. He admits he has been his tongue several times since symptoms began on Sunday.  He states he has tried using a salt water rinse and ice cubes to relieve symptoms.  Patient denies numbness or tingling of the lips or perioral region.  He denies facial asymmetry, speech changes, facial sensation changes.  Review of Systems  Constitutional: Negative.   HENT: Negative.    Respiratory: Negative.    Cardiovascular: Negative.   Neurological:  Negative for tingling, sensory change and speech change.  Per HPI       Objective:     BP 122/78   Ht 5\' 7"  (1.702 m)   Wt 170 lb 9.6 oz (77.4 kg)   BMI 26.72 kg/m   Physical Exam Vitals reviewed.  Constitutional:      General: He is not in acute distress.    Appearance: Normal appearance.  HENT:     Nose: Nose normal.     Mouth/Throat:     Mouth: Mucous membranes are moist.     Pharynx: Oropharynx is clear.     Comments: White coating noted on back portion of tongue. No swelling. No erythema.  Eyes:     Extraocular Movements: Extraocular movements intact.     Conjunctiva/sclera: Conjunctivae normal.  Cardiovascular:     Rate and Rhythm: Normal rate and regular rhythm.     Heart sounds: No murmur heard.    No friction rub. No gallop.  Pulmonary:     Effort: Pulmonary effort is normal.     Breath sounds: Normal breath sounds. No stridor. No wheezing, rhonchi or rales.  Musculoskeletal:        General: Normal range of motion.  Lymphadenopathy:     Cervical: No cervical adenopathy.  Skin:    General: Skin is warm and dry.      Capillary Refill: Capillary refill takes less than 2 seconds.  Neurological:     General: No focal deficit present.     Mental Status: He is alert and oriented to person, place, and time.  Psychiatric:        Mood and Affect: Mood normal.        Behavior: Behavior normal.     No results found for any visits on 10/23/23.      Assessment & Plan:  Oral thrush -     magic mouthwash (nystatin, lidocaine, diphenhydrAMINE, alum & mag hydroxide) suspension; Swish and swallow 5 mLs 4 (four) times daily.  Dispense: 180 mL; Refill: 0 -     Fluconazole; Take 1 tablet (150 mg total) by mouth once a week.  Dispense: 2 tablet; Refill: 0  Patient appears well today in office, he is not in acute distress.  Physical exam notable for white coating on the back of his tongue.  He denies any other symptoms related to this complaint.  With his recent use of prednisone and maintenance use of Trelegy inhaler I suspect thrush causing symptoms.  Nystatin mouthwash and Diflucan ordered for symptoms today.  Patient will follow-up with me if his symptoms do not  improve over the next couple of days.   Return if symptoms worsen or fail to improve.  Toni Amend Kailah Pennel, PA-C

## 2023-10-27 ENCOUNTER — Telehealth: Payer: Self-pay

## 2023-10-27 NOTE — Telephone Encounter (Signed)
-----   Message from Alfonse Spruce sent at 10/01/2023  6:45 AM EST ----- ENT referral placed.

## 2023-10-28 NOTE — Telephone Encounter (Signed)
Thanks, Dee!!   Tonilynn Bieker, MD [facility]  

## 2023-11-19 ENCOUNTER — Ambulatory Visit: Payer: Self-pay | Admitting: Family Medicine

## 2023-11-19 NOTE — Telephone Encounter (Signed)
 Copied from CRM [id number]. Topic: Clinical - Red Word Triage >> Nov 19, 2023  9:23 AM Ivette P wrote: [facility] that prompted transfer to Nurse Triage: Shortness of Breath  Chief Complaint: Difficulty breathing Symptoms: Heavy breathing Frequency: 3 weeks Pertinent Negatives: Patient denies dizziness and chest pain Disposition: [] ED /[] Urgent Care (no appt availability in office) / [] Appointment(In office/virtual)/ []  [facility]/ [] Home Care/ [] Refused Recommended Disposition /[] [place] [facility]/ []  Follow-up with PCP Additional Notes: Patient called in to report difficulty breathing following a rib injury that occurred over the week of Christmas. Patient stated that rib injury occurred after he slipped and fell in a driveway. Patient stated that he went to an UC following the injury and is just looking to follow-up with his PCP. Patient stated breathing feels "heavy". Patient stated breathing becomes more difficult with physical activity. Patient denies wheezing and coughing. Patient denies dizziness and chest pain. Advised patient to see a provider within 4 hours. Patient refused disposition because he has to attend an after-school meeting. Agent had scheduled an appointment for this patient in office tomorrow, prior to transferring to this RN. Advised patient that he could keep that appointment  and I would route this conversation to clinic. Advised patient that he would receive a call back if clinic needs to see him sooner. Advised patient to call back if difficulty breathing intensifies. Patient complied.   Reason for Disposition  [1] MILD difficulty breathing (e.g., minimal/no SOB at rest, SOB with walking, pulse <100) AND [2] NEW-onset or WORSE than normal  Answer Assessment - Initial Assessment Questions 1. RESPIRATORY STATUS: "Describe your breathing?" (e.g., wheezing, shortness of breath, unable to speak, severe coughing)      Heavy breathing, denies wheezing and  couging  2. ONSET: "When did this breathing problem begin?"      SOB started after rib injury that happened over Christmas break, patient states he slipped in the driveway  3. PATTERN "Does the difficult breathing come and go, or has it been constant since it started?"      States difficulty breathing comes and goes, gets worse with physical activity   4. SEVERITY: "How bad is your breathing?" (e.g., mild, moderate, severe)    - MILD: No SOB at rest, mild SOB with walking, speaks normally in sentences, can lie down, no retractions, pulse < 100.    - MODERATE: SOB at rest, SOB with minimal exertion and prefers to sit, cannot lie down flat, speaks in phrases, mild retractions, audible wheezing, pulse 100-120.    - SEVERE: Very SOB at rest, speaks in single words, struggling to breathe, sitting hunched forward, retractions, pulse > 120      Mild, states breathing seems to be fine at rest and when laying down  5. RECURRENT SYMPTOM: "Have you had difficulty breathing before?" If Yes, ask: "When was the last time?" and "What happened that time?"      Yes, states this happened the last time he injured ribs  6. CARDIAC HISTORY: "Do you have any history of heart disease?" (e.g., heart attack, angina, bypass surgery, angioplasty)      Denies  7. LUNG HISTORY: "Do you have any history of lung disease?"  (e.g., pulmonary embolus, asthma, emphysema)     Denies  8. CAUSE: "What do you think is causing the breathing problem?"      Rib injury  9. OTHER SYMPTOMS: "Do you have any other symptoms? (e.g., dizziness, runny nose, cough, chest pain, fever)  Denies  10. O2 SATURATION MONITOR:  "Do you use an oxygen saturation monitor (pulse oximeter) at home?" If Yes, ask: "What is your reading (oxygen level) today?" "What is your usual oxygen saturation reading?" (e.g., 95%)     Denies  Protocols used: Breathing Difficulty-A-AH

## 2023-11-20 ENCOUNTER — Ambulatory Visit (INDEPENDENT_AMBULATORY_CARE_PROVIDER_SITE_OTHER): Payer: 59 | Admitting: Nurse Practitioner

## 2023-11-20 DIAGNOSIS — R0609 Other forms of dyspnea: Secondary | ICD-10-CM | POA: Diagnosis not present

## 2023-11-20 DIAGNOSIS — R1011 Right upper quadrant pain: Secondary | ICD-10-CM

## 2023-11-20 DIAGNOSIS — M7918 Myalgia, other site: Secondary | ICD-10-CM | POA: Diagnosis not present

## 2023-11-20 LAB — POCT URINALYSIS DIP (CLINITEK)
Bilirubin, UA: NEGATIVE
Blood, UA: NEGATIVE
Glucose, UA: NEGATIVE mg/dL
Ketones, POC UA: NEGATIVE mg/dL
Leukocytes, UA: NEGATIVE
Nitrite, UA: NEGATIVE
POC PROTEIN,UA: NEGATIVE
Spec Grav, UA: 1.015 (ref 1.010–1.025)
Urobilinogen, UA: 0.2 U/dL
pH, UA: 6.5 (ref 5.0–8.0)

## 2023-11-20 NOTE — Progress Notes (Signed)
Subjective:    Patient ID: Erik Ellis, male    DOB: 1966-03-23, 58 y.o.   MRN: [id number]  HPI Erik Ellis presents today complaining of shortness of breath and pain on his right side after a fall on 10/26/2023. He fell in his driveway and started experiencing pain on right posterior ribs. He did have some mild swelling to the area. He was seen at an urgent care in [place] on 10/30/2023 he was prescribed meloxicam and a muscle relaxer. Was not able to pick up muscle relaxer due to insurance coverage. No xray at that time. Patient states that the pain is worse, and has migrated from right flank area to the the front of his right abdomen. States that pain is currently worse than initial injury. Reports shortness of breath with activity, movement, and when taking a deep breath. Still taking meloxicam twice per day with no relief. Has taken tylenol with no relief.   Review of Systems  Constitutional:  Negative for chills, fatigue and fever.  Respiratory:  Positive for shortness of breath. Negative for cough, chest tightness and wheezing.   Cardiovascular:  Positive for chest pain. Negative for palpitations.       Positive chest wall pain localized to the right lower anterior chest  Gastrointestinal:  Negative for diarrhea, nausea and vomiting.       Positive for RUQ abd pain  Genitourinary:  Negative for difficulty urinating and dysuria.       Positive for right flank pain  Musculoskeletal:  Negative for back pain.  Skin:  Negative for rash.      Objective:   Physical Exam Vitals and nursing note reviewed.  Constitutional:      General: He is not in acute distress.    Appearance: Normal appearance. He is not ill-appearing.  Cardiovascular:     Rate and Rhythm: Normal rate and regular rhythm.  Pulmonary:     Effort: Pulmonary effort is normal. No respiratory distress.     Breath sounds: Normal breath sounds. No wheezing.  Abdominal:     General: Abdomen is flat. There is no  distension.     Palpations: Abdomen is soft. There is no hepatomegaly or splenomegaly.     Comments: Positive for tenderness with light and deep palpation in the RUQ. No bruising, masses or abnormalities noted.   Musculoskeletal:     Comments: Tenderness with palpation of the right axillary extending to the anterior portion of the ribs and right abdominal area. Mild tenderness with palpation of the right lower anterior chest wall. No tenderness posterior. Tenderness ends midline  Skin:    General: Skin is warm and dry.     Findings: No bruising, erythema or rash.  Neurological:     Mental Status: He is alert.  Psychiatric:        Mood and Affect: Mood normal.        Behavior: Behavior normal.        Thought Content: Thought content normal.        Judgment: Judgment normal.    Results for orders placed or performed in visit on 11/20/23  POCT URINALYSIS DIP (CLINITEK)   Collection Time: 11/20/23  4:33 PM  Result Value Ref Range   Color, UA yellow yellow   Clarity, UA clear clear   Glucose, UA negative negative mg/dL   Bilirubin, UA negative negative   Ketones, POC UA negative negative mg/dL   Spec Grav, UA 7.829 5.621 - 1.025   Blood, UA negative negative  pH, UA 6.5 5.0 - 8.0   POC PROTEIN,UA negative negative, trace   Urobilinogen, UA 0.2 0.2 or 1.0 E.U./dL   Nitrite, UA Negative Negative   Leukocytes, UA Negative Negative    Vitals:   11/20/23 1531  BP: 128/76  Pulse: 81  Temp: 98.6 F (37 C)  Height: 5\' 7"  (1.702 m)  Weight: 173 lb 3.2 oz (78.6 kg)  SpO2: 97%  BMI (Calculated): 27.12       Assessment & Plan:  1. Muscular abdominal pain in right flank - POCT URINALYSIS DIP (CLINITEK)  2. Dyspnea on minimal exertion -Chest x-ray ordered  3. Right upper quadrant abdominal pain (Primary) - CBC with Differential/Platelet - Lipase - Hepatic function panel  -[place] of RUQ ordered.  Feel pain is most likely inflammation of the nerves along the dermatomal line from  the injury. Patient is already on Gabapentin.  Educated patient that if symptoms worsen to seek immediate care.   Return if symptoms worsen or fail to improve. I have seen and examined this patient alongside the NP student. I have reviewed and verified the student note and agree with the assessment and plan.  Sherie Don, FNP

## 2023-11-21 ENCOUNTER — Encounter: Payer: Self-pay | Admitting: Nurse Practitioner

## 2023-11-25 ENCOUNTER — Ambulatory Visit (HOSPITAL_COMMUNITY)
Admission: RE | Admit: 2023-11-25 | Discharge: 2023-11-25 | Disposition: A | Discharge status: Home / Self Care | Payer: 59 | Source: Ambulatory Visit | Attending: Family Medicine | Admitting: Family Medicine

## 2023-11-25 DIAGNOSIS — R1011 Right upper quadrant pain: Secondary | ICD-10-CM | POA: Diagnosis present

## 2023-11-25 DIAGNOSIS — R0609 Other forms of dyspnea: Secondary | ICD-10-CM | POA: Diagnosis present

## 2023-11-28 ENCOUNTER — Ambulatory Visit: Payer: 59 | Admitting: Family Medicine

## 2023-11-28 VITALS — BP 121/73 | HR 65 | Temp 98.2°F | Ht 67.0 in | Wt 170.4 lb

## 2023-11-28 DIAGNOSIS — R682 Dry mouth, unspecified: Secondary | ICD-10-CM | POA: Diagnosis not present

## 2023-11-28 MED ORDER — NYSTATIN 100000 UNIT/ML MT SUSP: 5.0000 mL | Freq: Four times a day (QID) | OROMUCOSAL | 0 refills | Status: DC

## 2023-11-28 NOTE — Patient Instructions (Signed)
Medication for thrush.  If continues to persist, discuss with your Mental health provider as many of your medications cause dry mouth.

## 2023-11-30 DIAGNOSIS — R682 Dry mouth, unspecified: Secondary | ICD-10-CM | POA: Insufficient documentation

## 2023-11-30 NOTE — Progress Notes (Signed)
Subjective:  Patient ID: Erik Ellis, male    DOB: [birth year]  Age: 58 y.o. MRN: [id number]  CC:  Dry mouth   HPI:  58 year old male presents with complaints of dry mouth.   2 week history of dry mouth. He thought that this was related to recent medication but he has stopped it. He reports increase in water intake with persistent dry mouth. No other complaints at this time.  Patient Active Problem List   Diagnosis Date Noted   Dry mouth 11/30/2023   Stage 3a chronic kidney disease (HCC) 09/30/2022   Essential hypertension 09/24/2021   Hyperlipidemia 09/24/2021   Mild neurocognitive disorder due to multiple etiologies 03/20/2021   S/P cervical spinal fusion 02/21/2021   Moderate persistent asthma without complication 05/12/2020   Allergic rhinitis 02/17/2016   Bipolar disorder (HCC) 05/31/2015   Gastroesophageal reflux disease 05/31/2015   Sleep disorder 12/04/2011    Social Hx   Social History   Socioeconomic History   Marital status: Married    Spouse name: Not on file   Number of children: Not on file   Years of education: Not on file   Highest education level: Master's degree (e.g., MA, MS, MEng, MEd, MSW, MBA)  Occupational History   Not on file  Tobacco Use   Smoking status: Never   Smokeless tobacco: Never  Vaping Use   Vaping status: Never Used  Substance and Sexual Activity   Alcohol use: Not Currently   Drug use: No   Sexual activity: Not on file  Other Topics Concern   Not on file  Social History Narrative   Not on file   Social Drivers of Health   Financial Resource Strain: Low Risk  (10/23/2023)   Overall Financial Resource Strain (CARDIA)    Difficulty of Paying Living Expenses: Not hard at all  Food Insecurity: No Food Insecurity (10/23/2023)   Hunger Vital Sign    Worried About Running Out of Food in the Last Year: Never true    Ran Out of Food in the Last Year: Never true  Transportation Needs: No Transportation Needs (10/23/2023)    PRAPARE - Administrator, Civil Service (Medical): No    Lack of Transportation (Non-Medical): No  Physical Activity: Sufficiently Active (10/23/2023)   Exercise Vital Sign    Days of Exercise per Week: 5 days    Minutes of Exercise per Session: 30 min  Stress: No Stress Concern Present (10/23/2023)   [employer] of Occupational Health - Occupational Stress Questionnaire    Feeling of Stress : Only a little  Social Connections: Moderately Isolated (10/23/2023)   Social Connection and Isolation Panel [NHANES]    Frequency of Communication with Friends and Family: Once a week    Frequency of Social Gatherings with Friends and Family: Once a week    Attends Religious Services: More than 4 times per year    Active Member of [employer] or Organizations: No    Attends Engineer, structural: Not on file    Marital Status: Married    Review of Systems Per HPI  Objective:  BP 121/73   Pulse 65   Temp 98.2 F (36.8 C)   Ht 5\' 7"  (1.702 m)   Wt 170 lb 6.4 oz (77.3 kg)   SpO2 99%   BMI 26.69 kg/m      11/28/2023   10:45 AM 11/28/2023   10:12 AM 11/20/2023    3:31 PM  BP/Weight  Systolic  BP 121 170 128  Diastolic BP 73 92 76  Wt. (Lbs)  170.4 173.2  BMI  26.69 kg/m2 27.13 kg/m2    Physical Exam Constitutional:      General: He is not in acute distress.    Appearance: Normal appearance.  HENT:     Head: Normocephalic and atraumatic.     Mouth/Throat:     Comments: A few scatter with plaques noted in the buccal mucosa. Cardiovascular:     Rate and Rhythm: Normal rate and regular rhythm.  Pulmonary:     Effort: Pulmonary effort is normal.     Breath sounds: Normal breath sounds.  Neurological:     Mental Status: He is alert.     Lab Results  Component Value Date   WBC 8.9 09/30/2023   HGB 12.9 (L) 09/30/2023   HCT 40.0 09/30/2023   PLT 264 09/30/2023   GLUCOSE 119 (H) 09/30/2023   CHOL 126 03/17/2023   TRIG 118 03/17/2023   HDL 46 03/17/2023    LDLCALC 59 03/17/2023   ALT 21 09/30/2023   AST 18 09/30/2023   NA 137 09/30/2023   K 4.1 09/30/2023   CL 101 09/30/2023   CREATININE 1.35 (H) 09/30/2023   BUN 24 (H) 09/30/2023   CO2 27 09/30/2023   TSH 0.963 03/17/2023   INR 1.0 02/21/2021   HGBA1C 5.4 01/08/2022     Assessment & Plan:   Problem List Items Addressed This Visit       Other   Dry mouth - Primary   Thrush noted on exam. Treating with Nystatin. Patient on multiple medications that can cause dry mouth. Advised to discuss with mental health provider if it continues.       Meds ordered this encounter  Medications   nystatin (MYCOSTATIN) 100000 UNIT/ML suspension    Sig: Take 5 mLs (500,000 Units total) by mouth 4 (four) times daily. Swish and swallow.    Dispense:  140 mL    Refill:  0    Follow-up:  Return if symptoms worsen or fail to improve.  Everlene Other DO [facility]

## 2023-11-30 NOTE — Assessment & Plan Note (Signed)
Thrush noted on exam. Treating with Nystatin. Patient on multiple medications that can cause dry mouth. Advised to discuss with mental health provider if it continues.

## 2023-12-04 ENCOUNTER — Other Ambulatory Visit: Payer: Self-pay | Admitting: Allergy & Immunology

## 2023-12-16 ENCOUNTER — Other Ambulatory Visit: Payer: Self-pay | Admitting: Allergy & Immunology

## 2023-12-17 ENCOUNTER — Ambulatory Visit: Payer: 59 | Admitting: Family Medicine

## 2023-12-17 VITALS — BP 133/83 | HR 86 | Temp 98.4°F | Ht 67.0 in | Wt 170.0 lb

## 2023-12-17 DIAGNOSIS — R259 Unspecified abnormal involuntary movements: Secondary | ICD-10-CM | POA: Diagnosis not present

## 2023-12-17 NOTE — Assessment & Plan Note (Signed)
This is likely tardive dyskinesia. Erik Ellis has been discontinued. Referring to neurology >>> Consideration for new meds for treatment of TD and thorough neurological evaluation.

## 2023-12-17 NOTE — Patient Instructions (Addendum)
Referral is in.  This could be tardive dyskinesia from your prior medications. Discuss this with psychiatry.  They will reach out.

## 2023-12-17 NOTE — Progress Notes (Signed)
Subjective:  Patient ID: Erik Ellis, male    DOB: 09-24-1966  Age: 58 y.o. MRN: [id number]  CC:   Chief Complaint  Patient presents with   referral to neurology     Involuntary mouth movement since 2025 , 2 falls one in dec and one in jan 2025, nausea     HPI:  58 year old male presents for evaluation of the above.  Follows with psychiatry. Psychiatrist recently noted involuntary movements of the mouth (moving the jaw to the side). Also noted mouth breathing. Referral to neurology was recommended by psychiatrist. Patient no longer on Vraylar.  Likely Tardive dyskinesia given history and prior use of antipsychotic medication.  Patient Active Problem List   Diagnosis Date Noted   Involuntary movements 12/17/2023   Dry mouth 11/30/2023   Stage 3a chronic kidney disease (HCC) 09/30/2022   Essential hypertension 09/24/2021   Hyperlipidemia 09/24/2021   Mild neurocognitive disorder due to multiple etiologies 03/20/2021   S/P cervical spinal fusion 02/21/2021   Moderate persistent asthma without complication 05/12/2020   Allergic rhinitis 02/17/2016   Bipolar disorder (HCC) 05/31/2015   Gastroesophageal reflux disease 05/31/2015   Sleep disorder 12/04/2011    Social Hx   Social History   Socioeconomic History   Marital status: Married    Spouse name: Not on file   Number of children: Not on file   Years of education: Not on file   Highest education level: Master's degree (e.g., MA, MS, MEng, MEd, MSW, MBA)  Occupational History   Not on file  Tobacco Use   Smoking status: Never   Smokeless tobacco: Never  Vaping Use   Vaping status: Never Used  Substance and Sexual Activity   Alcohol use: Not Currently   Drug use: No   Sexual activity: Not on file  Other Topics Concern   Not on file  Social History Narrative   Not on file   Social Drivers of Health   Financial Resource Strain: Low Risk  (10/23/2023)   Overall Financial Resource Strain (CARDIA)     Difficulty of Paying Living Expenses: Not hard at all  Food Insecurity: No Food Insecurity (10/23/2023)   Hunger Vital Sign    Worried About Running Out of Food in the Last Year: Never true    Ran Out of Food in the Last Year: Never true  Transportation Needs: No Transportation Needs (10/23/2023)   PRAPARE - Administrator, Civil Service (Medical): No    Lack of Transportation (Non-Medical): No  Physical Activity: Sufficiently Active (10/23/2023)   Exercise Vital Sign    Days of Exercise per Week: 5 days    Minutes of Exercise per Session: 30 min  Stress: No Stress Concern Present (10/23/2023)   [employer] of Occupational Health - Occupational Stress Questionnaire    Feeling of Stress : Only a little  Social Connections: Moderately Isolated (10/23/2023)   Social Connection and Isolation Panel [NHANES]    Frequency of Communication with Friends and Family: Once a week    Frequency of Social Gatherings with Friends and Family: Once a week    Attends Religious Services: More than 4 times per year    Active Member of [employer] or Organizations: No    Attends Engineer, structural: Not on file    Marital Status: Married    Review of Systems Per HPI  Objective:  BP 133/83   Pulse 86   Temp 98.4 F (36.9 C)   Ht 5'  7" (1.702 m)   Wt 170 lb (77.1 kg)   SpO2 100%   BMI 26.63 kg/m      12/17/2023    4:36 PM 11/28/2023   10:45 AM 11/28/2023   10:12 AM  BP/Weight  Systolic BP 133 121 170  Diastolic BP 83 73 92  Wt. (Lbs) 170  170.4  BMI 26.63 kg/m2  26.69 kg/m2    Physical Exam Constitutional:      General: He is not in acute distress. HENT:     Head: Normocephalic and atraumatic.  Cardiovascular:     Rate and Rhythm: Normal rate and regular rhythm.  Neurological:     Mental Status: He is alert.     Comments: Repetitive movement of the mouth/jaw noted.  Psychiatric:     Comments: Flat affect.     Lab Results  Component Value Date   WBC 8.9  09/30/2023   HGB 12.9 (L) 09/30/2023   HCT 40.0 09/30/2023   PLT 264 09/30/2023   GLUCOSE 119 (H) 09/30/2023   CHOL 126 03/17/2023   TRIG 118 03/17/2023   HDL 46 03/17/2023   LDLCALC 59 03/17/2023   ALT 21 09/30/2023   AST 18 09/30/2023   NA 137 09/30/2023   K 4.1 09/30/2023   CL 101 09/30/2023   CREATININE 1.35 (H) 09/30/2023   BUN 24 (H) 09/30/2023   CO2 27 09/30/2023   TSH 0.963 03/17/2023   INR 1.0 02/21/2021   HGBA1C 5.4 01/08/2022     Assessment & Plan:   Problem List Items Addressed This Visit       Other   Involuntary movements - Primary   This is likely tardive dyskinesia. Leafy Kindle has been discontinued. Referring to neurology >>> Consideration for new meds for treatment of TD and thorough neurological evaluation.      Relevant Orders   Ambulatory referral to Neurology   Everlene Other DO [facility]

## 2023-12-23 ENCOUNTER — Encounter: Payer: Self-pay | Admitting: Allergy & Immunology

## 2023-12-23 ENCOUNTER — Other Ambulatory Visit: Payer: Self-pay

## 2023-12-23 ENCOUNTER — Ambulatory Visit: Payer: 59 | Admitting: Allergy & Immunology

## 2023-12-23 VITALS — BP 130/68 | HR 79 | Temp 98.3°F | Resp 17

## 2023-12-23 DIAGNOSIS — J387 Other diseases of larynx: Secondary | ICD-10-CM

## 2023-12-23 DIAGNOSIS — J310 Chronic rhinitis: Secondary | ICD-10-CM

## 2023-12-23 DIAGNOSIS — J4550 Severe persistent asthma, uncomplicated: Secondary | ICD-10-CM

## 2023-12-23 DIAGNOSIS — K219 Gastro-esophageal reflux disease without esophagitis: Secondary | ICD-10-CM

## 2023-12-23 DIAGNOSIS — R682 Dry mouth, unspecified: Secondary | ICD-10-CM

## 2023-12-23 MED ORDER — NYSTATIN 100000 UNIT/ML MT SUSP: 5.0000 mL | Freq: Four times a day (QID) | OROMUCOSAL | 0 refills | Status: DC

## 2023-12-23 NOTE — Patient Instructions (Addendum)
 1. Moderate persistent asthma - with possible vocal cord dysfunction - Lung testing looks excellent today - Continue with use of honey tea, cough drops, humidifier to help with lingering cough.   - Daily controller medication(s): Singulair 10mg  daily and Trelegy 200/62.5/25 one puff once daily and Tezspire monthly - Prior to physical activity: ProAir 2 puffs 10-15 minutes before physical activity. - Rescue medications: Albuterol 1-2 puffs or 1 vial nebulized every 4-6 hours as needed for wheezing/shortness of breath.  - Asthma control goals:  * Full participation in all desired activities (may need albuterol before activity) * Albuterol use two time or less a week on average (not counting use with activity) * Cough interfering with sleep two time or less a month * Oral steroids no more than once a year * No hospitalizations  2. GERD  - Continue Pantoprazole 40 mg daily. - Use Pepcid 40mg  daily.   - Use Carafate for 1 week.    3. Chronic Rhinitis - Continue with: Flonase (fluticasone) one spray per nostril daily  - Start nystatin swish and spit four times daily to help with any thrush (although I do not see anything right now).  4. Return in about 6 months (around 06/21/2024). You can have the follow up appointment with Dr. Dellis Anes or a Nurse Practicioner (our Nurse Practitioners are excellent and always have Physician oversight!).    Please inform us of any Emergency Department visits, hospitalizations, or changes in symptoms. Call us before going to the ED for breathing or allergy symptoms since we might be able to fit you in for a sick visit. Feel free to contact us anytime with any questions, problems, or concerns.  It was a pleasure to see you again today!  Websites that have reliable patient information: 1. American Academy of Asthma, Allergy, and Immunology: www.aaaai.org 2. Food Allergy Research and Education (FARE): foodallergy.org 3. Mothers of Asthmatics:  http://www.asthmacommunitynetwork.org 4. American College of Allergy, Asthma, and Immunology: www.acaai.org      "Like" [place] on Facebook and Instagram for our latest updates!      A healthy democracy works best when [employer] participate! Make sure you are registered to vote! If you have moved or changed any of your contact information, you will need to get this updated before voting! Scan the QR codes below to learn more!

## 2023-12-23 NOTE — Progress Notes (Unsigned)
 Virtual Visit Via Video       Consent was obtained for video visit:  {yes no:314532} Answered questions that patient had about telehealth interaction:  {yes no:314532} I discussed the limitations, risks, security and privacy concerns of performing an evaluation and management service by telemedicine. I also discussed with the patient that there may be a patient responsible charge related to this service. The patient expressed understanding and agreed to proceed.  Pt location: Home Physician Location: office Name of referring provider:  Tommie Sams, DO I connected with [name] at patients initiation/request on 12/25/2023 at  9:45 AM EST by video enabled telemedicine application and verified that I am speaking with the correct person using two identifiers. Pt MRN:  [id number] Pt DOB:  [birth year] Video Participants:  [name];  ***  Assessment/Plan:   Tardive dyskinesia -The patients symptoms are most consistent with tardive dyskinesia, likely due to Vraylar.  TD is a heterogeneous syndrome depending on a subtle balance between several neurotransmitters in the brain, including DA receptor blockade and hypersensitivity of DA and GABA receptors.  Unfortunately, tardive dyskinesia can be permanent, although sometimes it is not.  The best treatment at this point for tardive dyskinesia or medication such as Austedo or Ingrezza.  These medications are best prescribed by the physician who is treating the patient's mental health disorder, in his case Dr. Emilio Math, especially since he is on so many psychiatric medications that can cause tremor and movement disorders.   -discussed also that lithium can cause tremor but not tardive dyskinesia.  Studies are varied, but incidate that up to 2/3 of patients on lithium will have lithium-induced tremor, even if the patients are not toxic on the medication.  Likewise, Adderall is a common source of tremor.  ***  Subjective:   Erik Ellis was seen today in the movement disorders clinic for neurologic consultation at the request of Tommie Sams, DO.  The consultation is for the evaluation of tardive dyskinesia.  Patient's psychiatrist is Dr. Emilio Math.  I have the records and discussed the case personally with patient's primary care physician.   Specific Symptoms:  Tremor: {yes no:314532} Family hx of similar:  {yes no:314532} Voice: *** Sleep: ***  Vivid Dreams:  {yes no:314532}  Acting out dreams:  {yes no:314532} Wet Pillows: {yes no:314532} Postural symptoms:  {yes no:314532}  Falls?  {yes no:314532} Bradykinesia symptoms: {parkinson brady:18041} Loss of smell:  {yes no:314532} Loss of taste:  {yes no:314532} Urinary Incontinence:  {yes no:314532} Difficulty Swallowing:  {yes no:314532} Handwriting, micrographia: {yes no:314532} Trouble with ADL's:  {yes no:314532}  Trouble buttoning clothing: {yes no:314532} Depression:  {yes no:314532} Memory changes:  {yes no:314532} Hallucinations:  {yes no:314532}  visual distortions: {yes no:314532} N/V:  {yes no:314532} Lightheaded:  {yes no:314532}  Syncope: {yes no:314532} Diplopia:  {yes no:314532} Dyskinesia:  {yes no:314532} Prior exposure to reglan/antipsychotics: {yes no:314532}  Neuroimaging of the brain has *** previously been performed.  It *** available for my review today.  PREVIOUS MEDICATIONS: {Parkinson's RX:18200}  ALLERGIES:  No Known Allergies  CURRENT MEDICATIONS:  No outpatient medications have been marked as taking for the 12/25/23 encounter (Appointment) with Matin Mattioli, Octaviano Batty, DO.   Current Facility-Administered Medications for the 12/25/23 encounter (Appointment) with Camrie Stock, Octaviano Batty, DO  Medication   tezepelumab-ekko (TEZSPIRE) 210 MG/1. syringe 210 mg     Objective:   VITALS:  There were no vitals filed for this visit.  GEN:  The patient appears stated  age and is in NAD. HEENT:  Normocephalic, atraumatic.  The mucous  membranes are moist. The superficial temporal arteries are without ropiness or tenderness. CV:  RRR Lungs:  CTAB Neck/HEME:  There are no carotid bruits bilaterally.  Neurological examination:  Orientation: The patient is alert and oriented x3.  Cranial nerves: There is good facial symmetry. Extraocular muscles are intact. The visual fields are full to confrontational testing. The speech is fluent and clear. Soft palate rises symmetrically and there is no tongue deviation. Hearing is intact to conversational tone. Sensation: Sensation is intact to light and pinprick throughout (facial, trunk, extremities). Vibration is intact at the bilateral big toe. There is no extinction with double simultaneous stimulation. There is no sensory dermatomal level identified. Motor: Strength is 5/5 in the bilateral upper and lower extremities.   Shoulder shrug is equal and symmetric.  There is no pronator drift. Deep tendon reflexes: Deep tendon reflexes are 2/4 at the bilateral biceps, triceps, brachioradialis, patella and achilles. Plantar responses are downgoing bilaterally.  Movement examination: Tone: There is ***tone in the bilateral upper extremities.  The tone in the lower extremities is ***.  Abnormal movements: *** Coordination:  There is *** decremation with RAM's, *** Gait and Station: The patient has *** difficulty arising out of a deep-seated chair without the use of the hands. The patient's stride length is ***.  The patient has a *** pull test.     I have reviewed and interpreted the following labs independently   Chemistry      Component Value Date/Time   NA 137 09/30/2023 2013   NA 140 03/17/2023 1606   K 4.1 09/30/2023 2013   CL 101 09/30/2023 2013   CO2 27 09/30/2023 2013   BUN 24 (H) 09/30/2023 2013   BUN 33 (H) 03/17/2023 1606   CREATININE 1.35 (H) 09/30/2023 2013      Component Value Date/Time   CALCIUM 9.8 09/30/2023 2013   ALKPHOS 104 09/30/2023 2013   AST 18 09/30/2023 2013    ALT 21 09/30/2023 2013   BILITOT 0.8 09/30/2023 2013   BILITOT 0.8 03/17/2023 1606      Lab Results  Component Value Date   TSH 0.963 03/17/2023   Lab Results  Component Value Date   WBC 8.9 09/30/2023   HGB 12.9 (L) 09/30/2023   HCT 40.0 09/30/2023   MCV 98.3 09/30/2023   PLT 264 09/30/2023     Total time spent on today's visit was ***60 minutes, including both face-to-face time and nonface-to-face time.  Time included that spent on review of records (prior notes available to me/labs/imaging if pertinent), discussing treatment and goals, answering patient's questions and coordinating care.  Cc:  Tommie Sams, DO

## 2023-12-23 NOTE — Progress Notes (Unsigned)
 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   Subjective:   Erik Ellis is a 58 y.o. male presenting today for follow up of  Chief Complaint  Patient presents with   Asthma    Erik Ellis has a history of the following: Patient Active Problem List   Diagnosis Date Noted   Involuntary movements 12/17/2023   Dry mouth 11/30/2023   Stage 3a chronic kidney disease (HCC) 09/30/2022   Essential hypertension 09/24/2021   Hyperlipidemia 09/24/2021   Mild neurocognitive disorder due to multiple etiologies 03/20/2021   S/P cervical spinal fusion 02/21/2021   Moderate persistent asthma without complication 05/12/2020   Allergic rhinitis 02/17/2016   Bipolar disorder (HCC) 05/31/2015   Gastroesophageal reflux disease 05/31/2015   Sleep disorder 12/04/2011    History obtained from: chart review and {Persons; PED relatives w/patient:19415::"patient"}.  Discussed the use of AI scribe software for clinical note transcription with the patient and/or guardian, who gave verbal consent to proceed.  [name] presenting for {Blank single:19197::"a food challenge","a drug challenge","skin testing","a sick visit","an evaluation of ***","a follow up visit"}. He was last seen in December 2024 by Dr. Allena Katz. At that time, he was doing well without wheezing on exam. He was continued on Singulair as well as Trelegy and Tezspire. For her GERD, we continue  with Protonix 40mg  daily and continued on Pepcid as well as Carafate. He was continued on Flonase daily for his NAR.   Since the last visit,  Discussed the use of AI scribe software for clinical note transcription with the patient, who gave verbal consent to proceed.  History of Present Illness            Asthma/Respiratory Symptom History: ***  Allergic Rhinitis Symptom History: ***  Food Allergy Symptom History: ***  Skin Symptom History: ***  GERD Symptom History: ***  Infection Symptom History: ***  Otherwise, there have been no changes to his past medical history, surgical history, family history, or social history.    Review of systems otherwise negative other than that mentioned in the HPI.    Objective:   Blood pressure 130/68, pulse 79, temperature 98.3 F (36.8 C), temperature source Temporal, resp. rate 17, SpO2 98%. There is no height or weight on file to calculate BMI.    Physical Exam   Diagnostic studies:    Spirometry: results normal (FEV1: 3.58/109%, FVC: 4.16/99%, FEV1/FVC: 86%).    Spirometry consistent with normal pattern. {Blank single:19197::"Albuterol/Atrovent nebulizer","Xopenex/Atrovent nebulizer","Albuterol nebulizer","Albuterol four puffs via MDI","Xopenex four puffs via MDI"} treatment given in clinic with {Blank single:19197::"significant improvement in FEV1 per ATS criteria","significant improvement in FVC per ATS criteria","significant improvement in FEV1 and FVC per ATS criteria","improvement in FEV1, but not significant per ATS criteria","improvement in FVC, but not significant per ATS criteria","improvement in FEV1 and FVC, but not significant per ATS criteria","no improvement"}.  Allergy  Studies: {Blank single:19197::"none","deferred due to recent antihistamine use","deferred due to insurance stipulations that require a separate visit for testing","labs sent instead"," "}    {Blank single:19197::"Allergy testing results were read  and interpreted by myself, documented by clinical staff."," "}      Erik Bonds, MD  [facility]

## 2023-12-24 ENCOUNTER — Encounter: Payer: Self-pay | Admitting: Allergy & Immunology

## 2023-12-25 ENCOUNTER — Telehealth: Payer: 59 | Admitting: Neurology

## 2023-12-25 ENCOUNTER — Other Ambulatory Visit: Payer: Self-pay | Admitting: Family Medicine

## 2023-12-25 ENCOUNTER — Telehealth: Payer: Self-pay | Admitting: Neurology

## 2023-12-25 ENCOUNTER — Other Ambulatory Visit: Payer: Self-pay

## 2023-12-25 ENCOUNTER — Encounter: Payer: Self-pay | Admitting: Neurology

## 2023-12-25 DIAGNOSIS — R6889 Other general symptoms and signs: Secondary | ICD-10-CM

## 2023-12-25 DIAGNOSIS — T56891A Toxic effect of other metals, accidental (unintentional), initial encounter: Secondary | ICD-10-CM

## 2023-12-25 DIAGNOSIS — G2401 Drug induced subacute dyskinesia: Secondary | ICD-10-CM

## 2023-12-25 DIAGNOSIS — N289 Disorder of kidney and ureter, unspecified: Secondary | ICD-10-CM

## 2023-12-25 DIAGNOSIS — K117 Disturbances of salivary secretion: Secondary | ICD-10-CM | POA: Diagnosis not present

## 2023-12-25 DIAGNOSIS — R051 Acute cough: Secondary | ICD-10-CM

## 2023-12-25 DIAGNOSIS — R2681 Unsteadiness on feet: Secondary | ICD-10-CM | POA: Diagnosis not present

## 2023-12-25 DIAGNOSIS — Z5181 Encounter for therapeutic drug level monitoring: Secondary | ICD-10-CM

## 2023-12-25 DIAGNOSIS — R0602 Shortness of breath: Secondary | ICD-10-CM

## 2023-12-25 NOTE — Telephone Encounter (Signed)
 Pt. Called to provide Psch Dr. Emilio Math [phone] to Dr. Arbutus Leas, please contract the Doctor

## 2023-12-25 NOTE — Telephone Encounter (Signed)
 Called patient to try to go over medications and what he needs to do tell his DR he was insistant I give to Dr. Arbutus Leas

## 2023-12-26 ENCOUNTER — Other Ambulatory Visit: Payer: 59

## 2023-12-26 ENCOUNTER — Other Ambulatory Visit: Payer: Self-pay

## 2023-12-26 DIAGNOSIS — R259 Unspecified abnormal involuntary movements: Secondary | ICD-10-CM

## 2023-12-26 DIAGNOSIS — R296 Repeated falls: Secondary | ICD-10-CM

## 2023-12-26 DIAGNOSIS — R270 Ataxia, unspecified: Secondary | ICD-10-CM

## 2024-01-02 ENCOUNTER — Other Ambulatory Visit: Payer: Self-pay | Admitting: Allergy & Immunology

## 2024-01-07 ENCOUNTER — Other Ambulatory Visit: Payer: Self-pay | Admitting: Internal Medicine

## 2024-01-10 LAB — HUNTINGTON DISEASE REPEAT EXP

## 2024-01-12 DIAGNOSIS — B3789 Other sites of candidiasis: Secondary | ICD-10-CM | POA: Insufficient documentation

## 2024-01-12 DIAGNOSIS — R490 Dysphonia: Secondary | ICD-10-CM | POA: Insufficient documentation

## 2024-01-13 LAB — LUPUS ANTICOAGULANT AND ANTIPHOSPHOLIPID CONFIRM W/CONSULT
Anticardiolipin IgG: <2 [GPL'U]/mL (ref <20.0)
Anticardiolipin IgM: <2 [MPL'U]/mL (ref <20.0)
Beta-2 Glyco 1 IgM: <2 U/mL (ref <20.0)
Beta-2 Glyco I IgG: <2 U/mL (ref <20.0)
PTT-LA Screen: 39 s (ref <=40)
dRVVT: 40 s (ref <=45)

## 2024-01-13 LAB — CBC
HCT: 43.2 % (ref 38.5–50.0)
Hemoglobin: 14.2 g/dL (ref 13.2–17.1)
MCH: 31.1 pg (ref 27.0–33.0)
MCHC: 32.9 g/dL (ref 32.0–36.0)
MCV: 94.5 fL (ref 80.0–100.0)
MPV: 9.2 fL (ref 7.5–12.5)
Platelets: 291 10*3/uL (ref 140–400)
RBC: 4.57 10*6/uL (ref 4.20–5.80)
RDW: 12.2 % (ref 11.0–15.0)
WBC: 8.7 10*3/uL (ref 3.8–10.8)

## 2024-01-13 LAB — COMPREHENSIVE METABOLIC PANEL
AG Ratio: 1.6 (calc) (ref 1.0–2.5)
ALT: 17 U/L (ref 9–46)
AST: 15 U/L (ref 10–35)
Albumin: 4.7 g/dL (ref 3.6–5.1)
Alkaline phosphatase (APISO): 123 U/L (ref 35–144)
BUN/Creatinine Ratio: 18 (calc) (ref 6–22)
BUN: 25 mg/dL (ref 7–25)
CO2: 31 mmol/L (ref 20–32)
Calcium: 10.4 mg/dL — ABNORMAL HIGH (ref 8.6–10.3)
Chloride: 103 mmol/L (ref 98–110)
Creat: 1.37 mg/dL — ABNORMAL HIGH (ref 0.70–1.30)
Globulin: 2.9 g/dL (ref 1.9–3.7)
Glucose, Bld: 60 mg/dL — ABNORMAL LOW (ref 65–99)
Potassium: 4 mmol/L (ref 3.5–5.3)
Sodium: 141 mmol/L (ref 135–146)
Total Bilirubin: 0.6 mg/dL (ref 0.2–1.2)
Total Protein: 7.6 g/dL (ref 6.1–8.1)

## 2024-01-13 LAB — PARATHYROID HORMONE, INTACT (NO CA): PTH: 23 pg/mL (ref 16–77)

## 2024-01-13 LAB — VITAMIN B12: Vitamin B-12: 568 pg/mL (ref 200–1100)

## 2024-01-13 LAB — RPR: RPR Ser Ql: NONREACTIVE

## 2024-01-13 LAB — SEDIMENTATION RATE: Sed Rate: 9 mm/h (ref 0–20)

## 2024-01-13 LAB — CERULOPLASMIN: Ceruloplasmin: 30 mg/dL (ref 14–30)

## 2024-01-13 LAB — COPPER, SERUM: Copper: 132 ug/dL (ref 70–175)

## 2024-01-13 LAB — ANA: Anti Nuclear Antibody (ANA): NEGATIVE

## 2024-01-21 ENCOUNTER — Encounter: Payer: Self-pay | Admitting: Neurology

## 2024-01-31 ENCOUNTER — Ambulatory Visit
Admission: RE | Admit: 2024-01-31 | Discharge: 2024-01-31 | Disposition: A | Discharge status: Home / Self Care | Payer: 59 | Source: Ambulatory Visit | Attending: Neurology | Admitting: Neurology

## 2024-01-31 DIAGNOSIS — R270 Ataxia, unspecified: Secondary | ICD-10-CM

## 2024-01-31 DIAGNOSIS — R259 Unspecified abnormal involuntary movements: Secondary | ICD-10-CM

## 2024-01-31 DIAGNOSIS — R296 Repeated falls: Secondary | ICD-10-CM

## 2024-01-31 MED ORDER — GADOPICLENOL 0.5 MMOL/ML IV SOLN
8.0000 mL | Freq: Once | INTRAVENOUS | Status: AC | PRN
  Administered 2024-01-31: 8 mL via INTRAVENOUS

## 2024-02-10 NOTE — Progress Notes (Unsigned)
 Assessment/Plan:   1.  Tardive dyskinesia -The patient looked really good in the office today.  I did not see any of the oral movements that I saw on video last visit.  The patient states that symptoms have markedly improved in the tongue, although occasionally he will notice that his lips want to move inward, but that is rare.  This all started when his lithium level went up.  He was told that his lithium level was high at the time and that he was toxic on it, but he tried to drop the dose for several days and he felt suicidal and ended up going back up on the dose, per patient.  He also started [place] around that time.  He started [place] about 10 days ago.  I see very few symptoms, if at all today.  His lab workup, including HD labs were negative.  His MRI of the brain was negative.  2.  Tremor             -Has apparently had this for a long time and discussed again that lithium is associated with tremor.  Studies are varied, but incidate that up to 2/3 of patients on lithium will have lithium-induced tremor, even if the patients are not toxic on the medication.  Likewise, Adderall is a common source of tremor.  He reports he has actually tried to back down on his Adderall to once per day because of tremor (and states that he really only takes it to stay awake).   3.  Follow-up as needed  Subjective:   Erik Ellis was seen today in follow up for dyskinesia.  My previous records as well as any outside records available were reviewed prior to todays visit.  Patient has only been seen 1 time and that was a video visit.  At that point in time, he appeared to have orobuccolingual dyskinesia and it appeared fairly consistent with tardive dyskinesia.  He had not been on Vraylar for about a year, and the patient had reported that the movements were just new in onset.  He did report that he was a bit lithium toxic at the time it started and he was just in the process of reducing his lithium dose.   Pt states that he reduced it for 5 days but he was suicidal and he had to go back up.  He was also started on Latuda.  He was started on ingrezza about 10 days ago.  He thinks he is doing better.  He does not notice the tongue movements much anymore, but occasionally notices some lip movements.  He notices that sometimes he is shaky.  I ordered an MRI of the brain and personally reviewed it.  It was unremarkable.  He had lab work.  Testing for HD was negative.  Serum copper was normal.  RPR was negative.  Ceruloplasmin was normal.  ANA was negative.  B12 was normal at 568.  Sedimentation rate was 9.  I do see that patient was seen by ENT about a month ago for complaints of dyspnea, cough and dysphonia.  She noted patient's mouth was dry (which was a complaint last visit) patient was treated for laryngeal candidiasis with nystatin.   CURRENT MEDICATIONS:  Outpatient Encounter Medications as of 02/12/2024  Medication Sig   albuterol (PROVENTIL) (2.5 MG/3ML) 0.083% nebulizer solution Take 3 mLs (2.5 mg total) by nebulization every 6 (six) hours as needed for shortness of breath or wheezing.   albuterol (VENTOLIN  HFA) 108 (90 Base) MCG/ACT inhaler Inhale 2 puffs into the lungs every 4 (four) hours as needed. For shortness of breath/wheezing   amLODipine (NORVASC) 5 MG tablet Take 1 tablet (5 mg total) by mouth daily.   amphetamine-dextroamphetamine (ADDERALL) 20 MG tablet Take 20 mg by mouth 2 (two) times daily.   Ascorbic Acid (VITAMIN C) 1000 MG tablet Take 1,000 mg by mouth daily.   atorvastatin (LIPITOR) 20 MG tablet Take 1 tablet (20 mg total) by mouth daily.   buPROPion (WELLBUTRIN XL) 150 MG 24 hr tablet Take 450 mg by mouth daily.   busPIRone (BUSPAR) 30 MG tablet Take 30 mg by mouth 2 (two) times daily.   famotidine (PEPCID) 40 MG tablet TAKE 1 TABLET BY MOUTH EVERY DAY   fluticasone (FLONASE) 50 MCG/ACT nasal spray Place 1 spray into both nostrils daily.   Fluticasone-Umeclidin-Vilant (TRELEGY  ELLIPTA) 200-62.5-25 MCG/ACT AEPB TAKE 1 PUFF BY MOUTH EVERY DAY   gabapentin (NEURONTIN) 300 MG capsule Take 300 mg by mouth at bedtime.   lamoTRIgine (LAMICTAL) 200 MG tablet Take 200 mg by mouth 2 (two) times daily.   lithium carbonate (LITHOBID) 300 MG ER tablet Take 300 mg by mouth in the morning, at noon, and at bedtime.   lurasidone (LATUDA) 20 MG TABS tablet Take 20 mg by mouth daily.   montelukast (SINGULAIR) 10 MG tablet TAKE 1 TABLET BY MOUTH EVERYDAY AT BEDTIME   Multiple Vitamins-Minerals (MULTIVITAMIN WITH MINERALS) tablet Take 1 tablet by mouth daily.   Omega-3 Fatty Acids (FISH OIL) 1000 MG CAPS Take 2,000 mg by mouth daily.   pantoprazole (PROTONIX) 40 MG tablet TAKE 1 TABLET BY MOUTH EVERY DAY   sildenafil (VIAGRA) 100 MG tablet Take by mouth.   Tezepelumab-ekko (TEZSPIRE) 210 MG/1. SOAJ Inject 210 mg into the skin every 28 (twenty-eight) days.   traZODone (DESYREL) 50 MG tablet Take 50 mg by mouth at bedtime as needed for sleep.   valbenazine (INGREZZA) 40 MG capsule Take 40 mg by mouth daily.   XIIDRA 5 % SOLN Place 1 drop into both eyes in the morning and at bedtime.   zolpidem (AMBIEN) 10 MG tablet Take 5-10 mg by mouth at bedtime.    [DISCONTINUED] nystatin (MYCOSTATIN) 100000 UNIT/ML suspension Take 5 mLs (500,000 Units total) by mouth 4 (four) times daily.   Facility-Administered Encounter Medications as of 02/12/2024  Medication   tezepelumab-ekko (TEZSPIRE) 210 MG/1. syringe 210 mg     Objective:   PHYSICAL EXAMINATION:    VITALS:   Vitals:   02/12/24 0752  BP: 138/74  Pulse: 69  SpO2: 97%  Weight: 169 lb 6.4 oz (76.8 kg)  Height: 5\' 8"  (1.727 m)    GEN:  The patient appears stated age and is in NAD. HEENT:  Normocephalic, atraumatic.  The mucous membranes are moist. The superficial temporal arteries are without ropiness or tenderness. CV:  RRR Lungs:  CTAB Neck/HEME:  There are no carotid bruits bilaterally.  Neurological  examination:  Orientation: The patient is alert and oriented x3. Cranial nerves: There is good facial symmetry.The speech is fluent and clear. Soft palate rises symmetrically and there is no tongue deviation. Hearing is intact to conversational tone. Sensation: Sensation is intact to light touch throughout Motor: Strength is at least antigravity x4.  Movement examination: Tone: There is normal tone in the UE/LE Abnormal movements: No dyskinesia noted today, including in the mouth or tongue. Coordination:  There is no decremation with RAM's. Gait and Station: The patient has  no difficulty arising out of a deep-seated chair without the use of the hands. The patient's stride length is good.      Cc:  Tommie Sams, DO

## 2024-02-12 ENCOUNTER — Ambulatory Visit: Payer: 59 | Admitting: Neurology

## 2024-02-12 ENCOUNTER — Encounter: Payer: Self-pay | Admitting: Neurology

## 2024-02-12 VITALS — BP 138/74 | HR 69 | Ht 68.0 in | Wt 169.4 lb

## 2024-02-12 DIAGNOSIS — G251 Drug-induced tremor: Secondary | ICD-10-CM

## 2024-02-12 DIAGNOSIS — G2401 Drug induced subacute dyskinesia: Secondary | ICD-10-CM

## 2024-02-17 NOTE — Patient Instructions (Incomplete)
 1. Moderate persistent asthma  with acute exacerbation Start prednisone 10 mg taking 2 tablets twice a day for 3 days, then on the 4th day take 2 tablets in the morning, and on the 5th day take one tablet and stop - Daily controller medication(s): Singulair 10mg  daily and Trelegy 200/62.5/25 one puff once daily and Tezspire monthly - During asthma flares/upper respiratory infections start Pulmicort (budesonide) 0.5 mg using 1 vial twice a day for 1 to 2 weeks in addition to Trelegy - Prior to physical activity: ProAir 2 puffs 10-15 minutes before physical activity. - Rescue medications: Albuterol 1-2 puffs or 1 vial nebulized every 4-6 hours as needed for wheezing/shortness of breath.  - Asthma control goals:  * Full participation in all desired activities (may need albuterol before activity) * Albuterol use two time or less a week on average (not counting use with activity) * Cough interfering with sleep two time or less a month * Oral steroids no more than once a year * No hospitalizations  2. GERD  - Continue Pantoprazole 40 mg daily. - Use Pepcid 40mg  daily.   - Use Carafate for 1 week.    3. Chronic Rhinitis - Continue with: Flonase (fluticasone) one spray per nostril daily   Let us  know if your symptoms do not get better.  If your symptoms worsen or persist please go to the emergency room. 4. Schedule a follow up in 3 months with Dr. Idolina Maker  or sooner if needed   Complications of Chronic Steroid Use

## 2024-02-18 ENCOUNTER — Encounter: Payer: Self-pay | Admitting: Family

## 2024-02-18 ENCOUNTER — Telehealth: Payer: Self-pay | Admitting: Internal Medicine

## 2024-02-18 ENCOUNTER — Other Ambulatory Visit: Payer: Self-pay

## 2024-02-18 ENCOUNTER — Ambulatory Visit: Admitting: Family

## 2024-02-18 VITALS — BP 142/84 | HR 81 | Temp 98.3°F | Resp 18 | Ht 68.0 in | Wt 172.4 lb

## 2024-02-18 DIAGNOSIS — K219 Gastro-esophageal reflux disease without esophagitis: Secondary | ICD-10-CM | POA: Diagnosis not present

## 2024-02-18 DIAGNOSIS — J4551 Severe persistent asthma with (acute) exacerbation: Secondary | ICD-10-CM | POA: Diagnosis not present

## 2024-02-18 DIAGNOSIS — J310 Chronic rhinitis: Secondary | ICD-10-CM | POA: Diagnosis not present

## 2024-02-18 MED ORDER — PREDNISONE 10 MG PO TABS: ORAL_TABLET | ORAL | 0 refills | Status: DC

## 2024-02-18 MED ORDER — BUDESONIDE 0.5 MG/2ML IN SUSP: RESPIRATORY_TRACT | 3 refills | Status: AC

## 2024-02-18 NOTE — Progress Notes (Signed)
 [address] [place] [address] Dept: [phone]  FOLLOW UP NOTE  Patient ID: Erik Ellis, male    DOB: 05/28/1966  Age: 58 y.o. MRN: [id number] Date of Office Visit: 02/18/2024  Assessment  Chief Complaint: Allergic Rhinitis  (Cough and shortness of breath - maybe due to pollen and difficulty sleeping )  HPI Erik Ellis is a 58 year old male who presents today for an acute visit of dry cough, shortness of breath, and trouble sleeping.  He was last seen on December 23, 2023 by Dr. 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  He denies any new diagnosis or surgery since his last office visit.  Asthma: He continues to take Trelegy 200 mcg 1 puff once a day, Singulair 10 mg daily and Tezspire injections every 4 weeks.  He denies any problems or reactions with his Tezspire injections.  He cannot tell if the Tezspire  injections have helped.  He reports for the past 2 weeks he has had a dry cough, shortness of breath, and trouble sleeping.  This is how his asthma symptoms typically present when he is having a flare.  He denies any fever, chills, wheezing, tightness in the chest, chest pain, palpitations, lower extremity swelling, history of blood clots or recent long distance travel.  Since his last office visit he has not required any systemic steroids or made any trips to the emergency room or urgent care due to breathing problems.  Since his asthma has been acting up he has been using his nebulizer at night and his albuterol  inhaler a couple times a day.  When he uses his albuterol it helps for a short period of time.  He mentions his asthma was bad  in November and he had an elevated D-dimer and was instructed to go to the emergency room.  While at the emergency room he had a CT chest PE protocol showing: "No pulmonary embolism.  No acute intrathoracic pathology notified.  He reports that the prednisone he was given at the emergency room helped.  He usually needs 1 or 2 weeks of prednisone to knock it out.  He reports that he usually requires 1 or 2 rounds of steroids a year.  In the past year he has received 1 round of steroids in May 2024, 2 rounds of prednisone in August 2024 and 1 round in November 2024.  His absolute eosinophil count on September 30, 2023 was 100.  Since his last office visit he did see Dr. Miki Alert on January 12, 2024 for possible vocal cord dysfunction.  His office visit from January 12, 2024 shows:  "ASSESSMENT:   [name] is a 58 y.o. male with   1. Laryngeal candidiasis    PLAN: Today, I discussed issues and options today. The risks, benefits and alternatives were discussed and questions answered.   I have reviewed the patient's medical record from their referring provider and discussed the findings with the patient.  I have discussed my exam with SLP who is seeing/will be seeing the patient.   Patient would like to hold off on SLP therapy for now for respiratory retraining as he only has this feeling 1x per year.   I have ordered the following medications:  Nystatin QID for 14 days - swish gargle and swallow  I will continue to follow him for the above condition.  Procedure Note - Transnasal Laryngoscopy   At the request of Dr. Miki Alert, a flexibile laryngoscopy was completed. The procedure was undertaken to further evaluate the patient's larynx.  The nose was sprayed with oxymetazoline and 4% lidocaine. The Pentax distal chip laryngoscope was passed through the nose to view the pharynx and larynx. The larynx was examined at rest and during multiple phonatory tasks. The examination was video-recorded for  subsequent review.  Findings: The nasal cavity and nasopharynx did not reveal any masses or lesions, mucosa appeared to be wnl. The tongue base, pharyngeal walls, piriform sinuses, vallecula, epiglottis and postcricoid region are normal in appearance. The visualized portion of the subglottis and proximal trachea is widely patent. The vocal folds are mobile bilaterally. There are no lesions on the free edge of the vocal folds nor elsewhere in the larynx worrisome for malignancy. There is no glottal insufficiency. There is concern for laryngeal candidiasis - this was reviewed with Dr. Miki Alert. "  Gastroesophageal reflux disease: He does take pantoprazole 40 mg once a day.  He reports that he does not have any issues with reflux and is not sure why he is on this medication.  Chronic rhinitis: His lab work to environmental allergens in August 17, 2021 was negative and his skin testing on September 30, 2018 was negative to the entire panel.  He denies rhinorrhea, nasal congestion, and postnasal drip.  He has not been treated for any sinus infections since we last saw him.  He has fluticasone nasal spray to use as needed.   Drug Allergies:  No Known Allergies  Review of Systems: Negative except as per HPI   Physical Exam: BP (!) 142/84 (BP Location: Right Arm, Patient Position: Sitting, Cuff Size: Normal)   Pulse 81   Temp 98.3 F (36.8 C) (Temporal)   Resp 18   Ht 5\' 8"  (1.727 m)   Wt 172 lb 6.4 oz (78.2 kg)   SpO2 99%   BMI 26.21 kg/m    Physical Exam Constitutional:      Appearance: Normal appearance.  HENT:     Head: Normocephalic and atraumatic.     Comments: Pharynx normal, eyes normal, ears normal, nose normal    Right Ear: Tympanic membrane, ear canal and external ear normal.     Left Ear: Tympanic membrane, ear canal and external ear normal.     Nose: Nose normal.     Mouth/Throat:     Mouth: Mucous membranes are moist.     Pharynx: Oropharynx is clear.  Eyes:      Conjunctiva/sclera: Conjunctivae normal.  Cardiovascular:     Rate and Rhythm: Regular rhythm.     Heart sounds: Normal heart sounds.  Pulmonary:     Effort: Pulmonary effort is normal.     Breath sounds: Normal breath sounds.     Comments: Lungs clear to auscultation Musculoskeletal:     Cervical back: Neck supple.  Skin:    General: Skin is warm.  Neurological:     Mental Status: He is alert and oriented to person, place, and time.  Psychiatric:        Mood and Affect: Mood normal.        Behavior: Behavior normal.        Thought Content: Thought content normal.        Judgment: Judgment normal.     Diagnostics: FVC  3.68 L (88%), FEV1 3.20 L (98%), FEV1/FVC 0.87.  Spirometry indicates normal spirometry.  4 puffs of Xopenex given.  Postbronchodilator response shows FVC 3.92 L (93%), FEV1 3.39 L (103%), FEV1/FVC 0.86 L.  Spirometry indicates normal spirometry with a 5.94% change in FEV1.  Assessment and Plan: 1. Non-allergic rhinitis   2. Severe persistent asthma with acute exacerbation   3. Gastroesophageal reflux disease, unspecified whether esophagitis present     Meds ordered this encounter  Medications   budesonide (PULMICORT) 0.5 MG/2ML nebulizer solution    Sig: During asthma flares use 1 vial via nebulizer 2 times a day for 1-2 weeks in addition to Trelegy inhaler    Dispense:  120 mL    Refill:  3   predniSONE (DELTASONE) 10 MG tablet    Sig: Take 2 tablets twice a day for 3 days, then on the 4th day take 2 tablets in the morning, and on the 5th day take 1 tablet and stop    Dispense:  15 tablet    Refill:  0    Patient Instructions  1. Moderate persistent asthma  with acute exacerbation Start prednisone 10 mg taking 2 tablets twice a day for 3 days, then on the 4th day take 2 tablets in the morning, and on the 5th day take one tablet and stop - Daily controller medication(s): Singulair 10mg  daily and Trelegy 200/62.5/25 one puff once daily and Tezspire  monthly - During asthma flares/upper respiratory infections start Pulmicort (budesonide) 0.5 mg using 1 vial twice a day for 1 to 2 weeks in addition to Trelegy - Prior to physical activity: ProAir 2 puffs 10-15 minutes before physical activity. - Rescue medications: Albuterol 1-2 puffs or 1 vial nebulized every 4-6 hours as needed for wheezing/shortness of breath.  - Asthma control goals:  * Full participation in all desired activities (may need albuterol before activity) * Albuterol use two time or less a week on average (not counting use with activity) * Cough interfering with sleep two time or less a month * Oral steroids no more than once a year * No hospitalizations  2. GERD  - Continue Pantoprazole 40 mg daily. - Use Pepcid 40mg  daily.   - Use Carafate for 1 week.    3. Chronic Rhinitis - Continue with: Flonase (fluticasone) one spray per nostril daily   Let us  know if your symptoms do not get better.  If your symptoms worsen or persist please go to the emergency room. 4. Schedule a follow up in 3 months with Dr. Idolina Maker  or sooner if needed   Complications of Chronic Steroid Use        Return in about 3 months (around 05/19/2024), or if symptoms worsen or fail to improve.    Thank you for the opportunity to care for this patient.  Please do not hesitate to contact me with questions.  Tinnie Forehand, FNP [facility] 

## 2024-02-19 NOTE — Telephone Encounter (Signed)
 Erroneous encounter

## 2024-04-05 ENCOUNTER — Ambulatory Visit: Payer: BC Managed Care – PPO | Admitting: Family Medicine

## 2024-04-05 VITALS — BP 136/80 | HR 78 | Temp 97.9°F | Ht 68.0 in | Wt 169.0 lb

## 2024-04-05 DIAGNOSIS — L989 Disorder of the skin and subcutaneous tissue, unspecified: Secondary | ICD-10-CM

## 2024-04-05 DIAGNOSIS — J4541 Moderate persistent asthma with (acute) exacerbation: Secondary | ICD-10-CM | POA: Diagnosis not present

## 2024-04-05 DIAGNOSIS — J45901 Unspecified asthma with (acute) exacerbation: Secondary | ICD-10-CM | POA: Insufficient documentation

## 2024-04-05 MED ORDER — PREDNISONE 50 MG PO TABS: 50.0000 mg | ORAL_TABLET | Freq: Every day | ORAL | 0 refills | Status: AC

## 2024-04-05 NOTE — Assessment & Plan Note (Signed)
 Needs biopsy.  Referring to dermatology.

## 2024-04-05 NOTE — Progress Notes (Signed)
 Subjective:  Patient ID: Erik Ellis, male    DOB: 1966-08-07  Age: 58 y.o. MRN: [id number]  CC:   Chief Complaint  Patient presents with   6 month follow up   Asthma    Sob, dry cough x 2 weeks     HPI:  58 year old male presents for follow up.  Patient reports that he is having trouble with asthma. He is experiencing SOB, dry cough despite use of his regular medications. He states that he responds well to prednisone .  He is requesting this today.  Patient also reports that he has a skin lesion to his right forearm that he has had for at least 1 year.  He states that it scabs over but never fully heals.  Patient Active Problem List   Diagnosis Date Noted   Non-healing skin lesion 04/05/2024   Asthma exacerbation 04/05/2024   Involuntary movements 12/17/2023   Stage 3a chronic kidney disease (HCC) 09/30/2022   Essential hypertension 09/24/2021   Hyperlipidemia 09/24/2021   Mild neurocognitive disorder due to multiple etiologies 03/20/2021   S/P cervical spinal fusion 02/21/2021   Moderate persistent asthma without complication 05/12/2020   Allergic rhinitis 02/17/2016   Bipolar disorder (HCC) 05/31/2015   Gastroesophageal reflux disease 05/31/2015   Sleep disorder 12/04/2011    Social Hx   Social History   Socioeconomic History   Marital status: Married    Spouse name: Not on file   Number of children: Not on file   Years of education: Not on file   Highest education level: Master's degree (e.g., MA, MS, MEng, MEd, MSW, MBA)  Occupational History   Occupation: Runner, 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  Tobacco Use   Smoking status: Never   Smokeless tobacco: Never  Vaping Use   Vaping status: Never Used  Substance and Sexual Activity   Alcohol use: Yes    Comment: less than 1 time per month   Drug use: No   Sexual activity: Not on file  Other Topics Concern   Not on file  Social History Narrative   Not on file   Social Drivers of  Health   Financial Resource Strain: Low Risk  (10/23/2023)   Overall Financial Resource Strain (CARDIA)    Difficulty of Paying Living Expenses: Not hard at all  Food Insecurity: No Food Insecurity (10/23/2023)   Hunger Vital Sign    Worried About Running Out of Food in the Last Year: Never true    Ran Out of Food in the Last Year: Never true  Transportation Needs: No Transportation Needs (10/23/2023)   PRAPARE - Administrator, Civil Service (Medical): No    Lack of Transportation (Non-Medical): No  Physical Activity: Sufficiently Active (10/23/2023)   Exercise Vital Sign    Days of Exercise per Week: 5 days    Minutes of Exercise per Session: 30 min  Stress: No Stress Concern Present (10/23/2023)   [employer] of Occupational Health - Occupational Stress Questionnaire    Feeling of Stress : Only a little  Social Connections: Unknown (10/23/2023)   Social Connection and Isolation Panel [NHANES]    Frequency of Communication with Friends and Family: Once a week    Frequency of Social Gatherings with Friends and Family: Once a week    Attends Religious Services: More than 4 times per year    Active Member of [employer] or Organizations: No    Attends Banker Meetings: Not on file  Marital Status: Not on file  Recent Concern: Social Connections - Moderately Isolated (10/23/2023)   Social Connection and Isolation Panel [NHANES]    Frequency of Communication with Friends and Family: Once a week    Frequency of Social Gatherings with Friends and Family: Once a week    Attends Religious Services: More than 4 times per year    Active Member of [employer] or Organizations: No    Attends Engineer, structural: Not on file    Marital Status: Married    Review of Systems Per HPI  Objective:  BP 136/80   Pulse 78   Temp 97.9 F (36.6 C)   Ht 5\' 8"  (1.727 m)   Wt 169 lb (76.7 kg)   SpO2 100%   BMI 25.70 kg/m      04/05/2024    9:09 AM 02/18/2024     1:52 PM 02/12/2024    7:52 AM  BP/Weight  Systolic BP 136 142 138  Diastolic BP 80 84 74  Wt. (Lbs) 169 172.4 169.4  BMI 25.7 kg/m2 26.21 kg/m2 25.76 kg/m2    Physical Exam Constitutional:      General: He is not in acute distress.    Appearance: Normal appearance.  HENT:     Head: Normocephalic and atraumatic.  Cardiovascular:     Rate and Rhythm: Normal rate and regular rhythm.  Pulmonary:     Effort: Pulmonary effort is normal.     Breath sounds: Normal breath sounds. No wheezing or rales.  Neurological:     Mental Status: He is alert.     Lab Results  Component Value Date   WBC 8.7 12/26/2023   HGB 14.2 12/26/2023   HCT 43.2 12/26/2023   PLT 291 12/26/2023   GLUCOSE 60 (L) 12/26/2023   CHOL 126 03/17/2023   TRIG 118 03/17/2023   HDL 46 03/17/2023   LDLCALC 59 03/17/2023   ALT 17 12/26/2023   AST 15 12/26/2023   NA 141 12/26/2023   K 4.0 12/26/2023   CL 103 12/26/2023   CREATININE 1.37 (H) 12/26/2023   BUN 25 12/26/2023   CO2 31 12/26/2023   TSH 0.963 03/17/2023   INR 1.0 02/21/2021   HGBA1C 5.4 01/08/2022     Assessment & Plan:  Non-healing skin lesion Assessment & Plan: Needs biopsy.  Referring to dermatology.  Orders: -     Ambulatory referral to Dermatology  Moderate persistent asthma with exacerbation Assessment & Plan: Treating with prednisone .  Orders: -     predniSONE ; Take 1 tablet (50 mg total) by mouth daily for 5 days.  Dispense: 5 tablet; Refill: 0    Follow-up:  6 months  Arren Laminack Debrah Fan DO [facility]

## 2024-04-05 NOTE — Patient Instructions (Signed)
 Medication as directed.  Referral placed to dermatology.  Follow up in 6 months.  Take care  Dr. Debrah Fan

## 2024-04-05 NOTE — Assessment & Plan Note (Signed)
Treating with prednisone. 

## 2024-04-14 ENCOUNTER — Encounter: Payer: Self-pay | Admitting: Family Medicine

## 2024-05-18 ENCOUNTER — Encounter: Payer: Self-pay | Admitting: Allergy & Immunology

## 2024-05-18 ENCOUNTER — Ambulatory Visit: Admitting: Allergy & Immunology

## 2024-05-18 ENCOUNTER — Other Ambulatory Visit: Payer: Self-pay

## 2024-05-18 VITALS — BP 120/72 | HR 74 | Temp 98.6°F | Resp 19 | Ht 66.93 in | Wt 171.1 lb

## 2024-05-18 DIAGNOSIS — J310 Chronic rhinitis: Secondary | ICD-10-CM | POA: Diagnosis not present

## 2024-05-18 DIAGNOSIS — K219 Gastro-esophageal reflux disease without esophagitis: Secondary | ICD-10-CM | POA: Diagnosis not present

## 2024-05-18 DIAGNOSIS — J4551 Severe persistent asthma with (acute) exacerbation: Secondary | ICD-10-CM | POA: Diagnosis not present

## 2024-05-18 DIAGNOSIS — J387 Other diseases of larynx: Secondary | ICD-10-CM | POA: Diagnosis not present

## 2024-05-18 MED ORDER — TRELEGY ELLIPTA 200-62.5-25 MCG/ACT IN AEPB: 1.0000 | INHALATION_SPRAY | Freq: Every day | RESPIRATORY_TRACT | 5 refills | Status: AC

## 2024-05-18 MED ORDER — MONTELUKAST SODIUM 10 MG PO TABS: 10.0000 mg | ORAL_TABLET | Freq: Every day | ORAL | 1 refills | Status: AC

## 2024-05-18 NOTE — Addendum Note (Signed)
 Addended by: DANIEL NIVIA DEL on: 05/18/2024 05:29 PM   Modules accepted: Orders

## 2024-05-18 NOTE — Progress Notes (Signed)
 FOLLOW UP  Date of Service/Encounter:  05/18/24   Assessment:   Moderate persistent asthma with a component of vocal cord dysfunction   Alpha-1 antitrpysin and Aspergillus precipitins - all normal   Non-allergic rhinitis   COVID19 long hauler - doing better on Tezspire    Bipolar disorder - on lithium and Latuda as well as lamotrigine  and gabapentin    Chronic neck pain with surgery ten years ago and now with DDD   [employer]   Plan/Recommendations:   1. Moderate persistent asthma - Lung testing looks excellent today - I am not going to make any changes.    - We could try to add a second biologic if needed, but let's not change anything today. - Daily controller medication(s): Singulair  10mg  daily and Trelegy 200/62.5/25 one puff once daily and Tezspire  monthly - Prior to physical activity: albuterol  2 puffs 10-15 minutes before physical activity. - Rescue medications: Albuterol  1-2 puffs or 1 vial nebulizer every 4-6 hours as needed for wheezing/shortness of breath.  - Asthma control goals:  * Full participation in all desired activities (may need albuterol  before activity) * Albuterol  use two time or less a week on average (not counting use with activity) * Cough interfering with sleep two time or less a month * Oral steroids no more than once a year * No hospitalizations  2. GERD  - Continue Pantoprazole  40 mg daily. - Use Pepcid  40mg  daily.    3. Chronic Rhinitis - Continue with: Flonase  (fluticasone ) one spray per nostril daily   4. Return in about 6 months (around 11/18/2024). You can have the follow up appointment with Dr. Iva or a Nurse Practicioner (our Nurse Practitioners are excellent and always have Physician oversight!).   Subjective:   Erik Ellis is a 58 y.o. male presenting today for follow up of  Chief Complaint  Patient presents with   Establish Care   Follow-up    Erik Ellis has a history of the following: Patient  Active Problem List   Diagnosis Date Noted   Non-healing skin lesion 04/05/2024   Asthma exacerbation 04/05/2024   Involuntary movements 12/17/2023   Stage 3a chronic kidney disease (HCC) 09/30/2022   Essential hypertension 09/24/2021   Hyperlipidemia 09/24/2021   Mild neurocognitive disorder due to multiple etiologies 03/20/2021   S/P cervical spinal fusion 02/21/2021   Moderate persistent asthma without complication 05/12/2020   Allergic rhinitis 02/17/2016   Bipolar disorder (HCC) 05/31/2015   Gastroesophageal reflux disease 05/31/2015   Sleep disorder 12/04/2011    History obtained from: chart review and patient.  Discussed the use of AI scribe software for clinical note transcription with the patient and/or guardian, who gave verbal consent to proceed.  Erik Ellis is a 58 y.o. male presenting for a follow up visit.  He was last seen in April 2025.  At that time, he was started on prednisone .  He was continued on Singulair  as well as Trelegy 1 puff once daily.  He also remained on Tezspire  monthly.  He was also continued on pantoprazole  as well as Pepcid .  For his rhinitis, he was continued on Flonase .  Since last visit, he has done relatively well.   Asthma/Respiratory Symptom History: Erik Ellis needs a refill of Tezspire , which has been effective in managing his asthma symptoms. He previously used Fasenra  but did not feel that Fasenra  helped much. He has required two courses of prednisone  this year, in April and June, due to episodes of breathing difficulty. Nebulizer treatments are  used infrequently. He is on Trelegy for respiratory issues and has a prescription to pick up soon. Montelukast  is taken regularly.  Allergic Rhinitis Symptom History: He uses a nasal spray daily for nasal symptoms.  He uses the Flonase  every day.  GERD Symptom History: He has a history of heartburn and reports no problems with it while taking Protonix  and Pepcid .   For mental health management, he is on  Latuda and lithium. Involuntary jaw movements are managed with Ingrezza, which he will resume next week after a delay in obtaining the medication. He experiences dry mouth, attributed to medication side effects, and manages it with increased water intake and hard candies.  Socially, he is a Psychologist, occupational and teaches three physical education classes. He recently traveled to [facility], [place] , for a week. He has one child still living at home, who is seeking employment after completing a degree in [place] and [place].    Otherwise, there have been no changes to his past medical history, surgical history, family history, or social history.    Review of systems otherwise negative other than that mentioned in the HPI.    Objective:   Blood pressure 120/72, pulse 74, temperature 98.6 F (37 C), temperature source Temporal, resp. rate 19, height 5' 6.93 (1.7 m), weight 171 lb 1.6 oz (77.6 kg), SpO2 98%. Body mass index is 26.85 kg/m.    Physical Exam Vitals reviewed.  Constitutional:      Appearance: He is well-developed.     Comments: Anxious appearing.  HENT:     Head: Normocephalic and atraumatic.     Right Ear: Tympanic membrane, ear canal and external ear normal.     Left Ear: Tympanic membrane, ear canal and external ear normal.     Nose: No nasal deformity, septal deviation, mucosal edema or rhinorrhea.     Right Turbinates: Enlarged, swollen and pale.     Left Turbinates: Enlarged, swollen and pale.     Right Sinus: No maxillary sinus tenderness or frontal sinus tenderness.     Left Sinus: No maxillary sinus tenderness or frontal sinus tenderness.     Mouth/Throat:     Lips: Pink.     Mouth: Mucous membranes are moist. Mucous membranes are pale and not dry.     Pharynx: Oropharynx is clear. Uvula midline.     Comments: Cobblestoning in the posterior oropharynx. Eyes:     General: Lids are normal. No allergic shiner.       Right eye: No discharge.        Left eye: No  discharge.     Conjunctiva/sclera: Conjunctivae normal.     Right eye: Right conjunctiva is not injected. No chemosis.    Left eye: Left conjunctiva is not injected. No chemosis.    Pupils: Pupils are equal, round, and reactive to light.  Cardiovascular:     Rate and Rhythm: Normal rate and regular rhythm.     Heart sounds: Normal heart sounds.  Pulmonary:     Effort: No tachypnea, accessory muscle usage or respiratory distress.     Breath sounds: No transmitted upper airway sounds. No decreased breath sounds, wheezing, rhonchi or rales.     Comments: Moving air well in all lung fields.  No increased work of breathing.  Speaking in full sentences. Chest:     Chest wall: No tenderness.  Lymphadenopathy:     Cervical: No cervical adenopathy.  Skin:    Coloration: Skin is not pale.     Findings: No  abrasion, erythema, petechiae or rash. Rash is not papular, urticarial or vesicular.  Neurological:     Mental Status: He is alert.  Psychiatric:        Behavior: Behavior is cooperative.      Diagnostic studies:    Spirometry: results normal (FEV1: 3.60/111%, FVC: 4.30/103%, FEV1/FVC: 84%).    Spirometry consistent with normal pattern.   Allergy  Studies: none       Marty Shaggy, MD  [facility] 

## 2024-05-18 NOTE — Patient Instructions (Addendum)
 1. Moderate persistent asthma - with possible vocal cord dysfunction - Lung testing looks excellent today - I am not going to make any changes.    - We could try to add a second biologic if needed, but let's not change anything today. - Daily controller medication(s): Singulair  10mg  daily and Trelegy 200/62.5/25 one puff once daily and Tezspire  monthly - Prior to physical activity: albuterol  2 puffs 10-15 minutes before physical activity. - Rescue medications: Albuterol  1-2 puffs or 1 vial nebulizer every 4-6 hours as needed for wheezing/shortness of breath.  - Asthma control goals:  * Full participation in all desired activities (may need albuterol  before activity) * Albuterol  use two time or less a week on average (not counting use with activity) * Cough interfering with sleep two time or less a month * Oral steroids no more than once a year * No hospitalizations  2. GERD  - Continue Pantoprazole  40 mg daily. - Use Pepcid  40mg  daily.    3. Chronic Rhinitis - Continue with: Flonase  (fluticasone ) one spray per nostril daily   4. Return in about 6 months (around 11/18/2024). You can have the follow up appointment with Dr. Iva or a Nurse Practicioner (our Nurse Practitioners are excellent and always have Physician oversight!).    Please inform us  of any Emergency Department visits, hospitalizations, or changes in symptoms. Call us  before going to the ED for breathing or allergy  symptoms since we might be able to fit you in for a sick visit. Feel free to contact us  anytime with any questions, problems, or concerns.  It was a pleasure to see you again today!  Websites that have reliable patient information: 1. American Academy of Asthma, Allergy , and Immunology: www.aaaai.org 2. Food Allergy  Research and Education (FARE): foodallergy.org 3. Mothers of Asthmatics: http://www.asthmacommunitynetwork.org 4. [employer] of Allergy , Asthma, and Immunology:  www.acaai.org      "Like" us  on Facebook and Instagram for our latest updates!      A healthy democracy works best when [employer] participate! Make sure you are registered to vote! If you have moved or changed any of your contact information, you will need to get this updated before voting! Scan the QR codes below to learn more!

## 2024-06-08 ENCOUNTER — Other Ambulatory Visit: Payer: Self-pay | Admitting: Family Medicine

## 2024-06-08 ENCOUNTER — Other Ambulatory Visit: Payer: Self-pay | Admitting: Allergy & Immunology

## 2024-06-18 ENCOUNTER — Other Ambulatory Visit: Payer: Self-pay | Admitting: *Deleted

## 2024-06-18 MED ORDER — TEZSPIRE 210 MG/1.91ML ~~LOC~~ SOAJ: 210.0000 mg | SUBCUTANEOUS | 11 refills | Status: AC

## 2024-06-21 ENCOUNTER — Ambulatory Visit: Admitting: Physician Assistant

## 2024-08-12 ENCOUNTER — Ambulatory Visit
Admission: EM | Admit: 2024-08-12 | Discharge: 2024-08-12 | Disposition: A | Discharge status: Home / Self Care | Attending: Emergency Medicine | Admitting: Emergency Medicine

## 2024-08-12 ENCOUNTER — Other Ambulatory Visit: Payer: Self-pay

## 2024-08-12 ENCOUNTER — Encounter: Payer: Self-pay | Admitting: Emergency Medicine

## 2024-08-12 DIAGNOSIS — J45901 Unspecified asthma with (acute) exacerbation: Secondary | ICD-10-CM | POA: Diagnosis not present

## 2024-08-12 MED ORDER — PREDNISONE 10 MG PO TABS: 20.0000 mg | ORAL_TABLET | Freq: Every day | ORAL | 0 refills | Status: AC

## 2024-08-12 MED ORDER — BENZONATATE 100 MG PO CAPS: 100.0000 mg | ORAL_CAPSULE | Freq: Three times a day (TID) | ORAL | 0 refills | Status: AC

## 2024-08-12 NOTE — ED Provider Notes (Signed)
 [facility]    CSN: [id number] Arrival date & time: 08/12/24  1435      History   Chief Complaint Chief Complaint  Patient presents with   Cough    HPI Erik Ellis is a 58 y.o. male.   Patient presents to clinic over concern of a cough that has been ongoing for the past few weeks.  Around 3 weeks ago he had a viral URI.  After this started to clear up the cough began.  Reports this is what happens when his asthma flares up.  Has been using his inhaler, does not seem to be helping.  Denies wheezing or shortness of breath.  Coughing triggered with deep breathing.  Has not had fevers.  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  Has not tried cough medication.  The history is provided by medical records and the patient.  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/20/2021   S/P cervical spinal fusion 02/21/2021   Moderate persistent asthma without complication 05/12/2020   Allergic rhinitis 02/17/2016   Bipolar disorder (HCC) 05/31/2015   Gastroesophageal reflux disease 05/31/2015   Sleep disorder 12/04/2011    Past Surgical History:  Procedure Laterality Date   CHOLECYSTECTOMY     KNEE ARTHROSCOPY     LUMBAR DISC SURGERY     NECK SURGERY     POSTERIOR CERVICAL FUSION/FORAMINOTOMY N/A 02/21/2021   Procedure: Posterior cervical fusion with lateral mass fixation - Cervical Ficve-Six/Cervical Six-Seven;  Surgeon: Joshua Alm RAMAN, MD;  Location: [facility] OR;  Service: Neurosurgery;  Laterality:  N/A;   REPLACEMENT TOTAL KNEE         Home Medications    Prior to Admission medications   Medication Sig Start Date End Date Taking? Authorizing Provider  benzonatate (TESSALON) 100 MG capsule Take 1 capsule (100 mg total) by mouth every 8 (eight) hours. 08/12/24  Yes Cyndie Woodbeck  N, FNP  predniSONE  (DELTASONE ) 10 MG tablet Take 2 tablets (20 mg total) by mouth daily with breakfast for 5 days. 08/12/24 08/17/24 Yes Emeka Lindner  N, FNP  albuterol  (PROVENTIL ) (2.5 MG/3ML) 0.083% nebulizer solution Take 3 mLs (2.5 mg total) by nebulization every 6 (six) hours as needed for shortness of breath or wheezing. 10/06/23   Tobie Arleta SQUIBB, MD  albuterol  (VENTOLIN  HFA) 108 (90 Base) MCG/ACT inhaler Inhale 2 puffs into the lungs every 4 (four) hours as needed. For shortness of breath/wheezing 10/06/23   Tobie Arleta SQUIBB, MD  amLODipine  (NORVASC ) 5 MG tablet Take 1 tablet (5 mg total) by mouth daily. 10/07/23   Cook, Jayce G, DO  amphetamine -dextroamphetamine  (ADDERALL) 20 MG tablet Take 20 mg by mouth 2 (two) times daily. 09/08/23   [provider]  Ascorbic Acid (VITAMIN C) 1000 MG tablet Take 1,000 mg by mouth daily.    [provider]  atorvastatin  (LIPITOR) 20 MG tablet Take 1 tablet (20 mg total) by mouth daily. 10/07/23   Cook, Jayce G, DO  budesonide  (PULMICORT ) 0.5 MG/2ML  nebulizer solution During asthma flares use 1 vial via nebulizer 2 times a day for 1-2 weeks in addition to Trelegy inhaler 02/18/24   Cheryl Reusing, FNP  buPROPion  (WELLBUTRIN  XL) 150 MG 24 hr tablet Take 450 mg by mouth daily. 05/08/21   [provider]  busPIRone  (BUSPAR ) 30 MG tablet Take 30 mg by mouth 2 (two) times daily. 07/24/14   [provider]  famotidine  (PEPCID ) 40 MG tablet TAKE 1 TABLET BY MOUTH EVERY DAY 06/08/24   Iva Marty Saltness, MD  fluticasone  (FLONASE ) 50 MCG/ACT nasal spray Place 1 spray into both nostrils daily. 03/27/23 09/29/24  Iva Marty Saltness, MD   Fluticasone -Umeclidin-Vilant (TRELEGY ELLIPTA ) 200-62.5-25 MCG/ACT AEPB Inhale 1 puff into the lungs daily. 05/18/24   Iva Marty Saltness, MD  gabapentin  (NEURONTIN ) 300 MG capsule Take 300 mg by mouth at bedtime. 03/24/16   [provider]  lamoTRIgine  (LAMICTAL ) 200 MG tablet Take 200 mg by mouth 2 (two) times daily. 08/14/14   [provider]  lithium carbonate (LITHOBID) 300 MG ER tablet Take 300 mg by mouth in the morning, at noon, and at bedtime. 09/06/23   [provider]  lurasidone (LATUDA) 20 MG TABS tablet Take 20 mg by mouth daily. 12/30/23   [provider]  montelukast  (SINGULAIR ) 10 MG tablet Take 1 tablet (10 mg total) by mouth at bedtime. 05/18/24   Iva Marty Saltness, MD  Multiple Vitamins-Minerals (MULTIVITAMIN WITH MINERALS) tablet Take 1 tablet by mouth daily.    [provider]  Omega-3 Fatty Acids (FISH OIL) 1000 MG CAPS Take 2,000 mg by mouth daily.    [provider]  pantoprazole  (PROTONIX ) 40 MG tablet TAKE 1 TABLET BY MOUTH EVERY DAY 06/08/24   Cook, Jayce G, DO  sildenafil (VIAGRA) 100 MG tablet Take by mouth. 10/11/23   [provider]  Tezepelumab -ekko (TEZSPIRE ) 210 MG/1. SOAJ Inject 210 mg into the skin every 28 (twenty-eight) days. 06/18/24   Kozlow, Camellia PARAS, MD  traZODone (DESYREL) 50 MG tablet Take 50 mg by mouth at bedtime as needed for sleep. 09/02/23   [provider]  valbenazine (INGREZZA) 40 MG capsule Take 40 mg by mouth daily.    [provider]  XIIDRA  5 % SOLN Place 1 drop into both eyes in the morning and at bedtime. 08/13/18   [provider]  zolpidem (AMBIEN) 10 MG tablet Take 5-10 mg by mouth at bedtime.  07/26/14   [provider]    Family History Family History  Problem Relation Age of Onset   Asthma Father 56       bile duct cancer   Healthy Sister    Other Sister        mva   Healthy Child    Allergic rhinitis Neg Hx    Angioedema Neg  Hx    Atopy Neg Hx    Eczema Neg Hx    Immunodeficiency Neg Hx    Urticaria Neg Hx    Thyroid  disease Neg Hx     Social History Social History   Tobacco Use   Smoking status: Never   Smokeless tobacco: Never  Vaping Use   Vaping status: Never Used  Substance Use Topics   Alcohol use: Yes    Comment: less than 1 time per month   Drug use: No     Allergies   Patient has no known allergies.   Review of Systems Review of Systems  Per HPI  Physical Exam Triage Vital  Signs ED Triage Vitals  Encounter Vitals Group     BP 08/12/24 1444 (!) 150/76     Girls Systolic BP Percentile --      Girls Diastolic BP Percentile --      Boys Systolic BP Percentile --      Boys Diastolic BP Percentile --      Pulse Rate 08/12/24 1444 83     Resp 08/12/24 1444 20     Temp 08/12/24 1444 98.4 F (36.9 C)     Temp Source 08/12/24 1444 Oral     SpO2 08/12/24 1444 96 %     Weight --      Height --      Head Circumference --      Peak Flow --      Pain Score 08/12/24 1445 0     Pain Loc --      Pain Education --      Exclude from Growth Chart --    No data found.  Updated Vital Signs BP (!) 150/76 (BP Location: Right Arm)   Pulse 83   Temp 98.4 F (36.9 C) (Oral)   Resp 20   SpO2 96%   Visual Acuity Right Eye Distance:   Left Eye Distance:   Bilateral Distance:    Right Eye Near:   Left Eye Near:    Bilateral Near:     Physical Exam Vitals and nursing note reviewed.  Constitutional:      Appearance: Normal appearance.  HENT:     Head: Normocephalic and atraumatic.     Right Ear: External ear normal.     Left Ear: External ear normal.     Nose: Nose normal.     Mouth/Throat:     Mouth: Mucous membranes are moist.  Eyes:     Conjunctiva/sclera: Conjunctivae normal.  Cardiovascular:     Rate and Rhythm: Normal rate and regular rhythm.     Heart sounds: Normal heart sounds. No murmur heard. Pulmonary:     Effort: Pulmonary effort is normal. No respiratory  distress.     Breath sounds: Normal breath sounds. No wheezing.  Musculoskeletal:        General: Normal range of motion.  Skin:    General: Skin is warm and dry.  Neurological:     General: No focal deficit present.     Mental Status: He is alert and oriented to person, place, and time.  Psychiatric:        Mood and Affect: Mood normal.        Behavior: Behavior normal.      UC Treatments / Results  Labs (all labs ordered are listed, but only abnormal results are displayed) Labs Reviewed - No data to display  EKG   Radiology No results found.  Procedures Procedures (including critical care time)  Medications Ordered in UC Medications - No data to display  Initial Impression / Assessment and Plan / UC Course  I have reviewed the triage vital signs and the nursing notes.  Pertinent labs & imaging results that were available during my care of the patient were reviewed by me and considered in my medical decision making (see chart for details).  Vitals and triage reviewed, patient is hemodynamically stable.  Lungs vesicular, heart with regular rate and rhythm.  Patient is having trouble taking deep breath, seems to trigger a coughing fit.  Able to speak in full sentences, no acute distress.  Appears to be having a flareup of asthma  versus bronchitis.  Will treat with steroid burst.  Cough management discussed.  Plan of care, follow-up care and return precautions given, no questions at this time.     Final Clinical Impressions(s) / UC Diagnoses   Final diagnoses:  Asthma with acute exacerbation, unspecified asthma severity, unspecified whether persistent     Discharge Instructions      Take the steroids daily with breakfast for the next 5 days.  You can use the Tessalon Perles to help with your cough, take these every 8 hours as needed.  Ensure you are drinking at least 64 ounces of water daily.  Sleeping with a humidifier may help moisten the air at night, can do  over-the-counter cough drops as well.  Return to clinic if you develop fever, no improvement, or new concerning symptoms.    ED Prescriptions     Medication Sig Dispense Auth. Provider   predniSONE  (DELTASONE ) 10 MG tablet Take 2 tablets (20 mg total) by mouth daily with breakfast for 5 days. 10 tablet Dreama, Luisalberto Beegle  N, FNP   benzonatate (TESSALON) 100 MG capsule Take 1 capsule (100 mg total) by mouth every 8 (eight) hours. 30 capsule Dreama, Brightyn Mozer  N, FNP      PDMP not reviewed this encounter.   Dreama Floyce SAILOR, FNP 08/12/24 1506

## 2024-08-12 NOTE — Discharge Instructions (Addendum)
 Take the steroids daily with breakfast for the next 5 days.  You can use the Tessalon Perles to help with your cough, take these every 8 hours as needed.  Ensure you are drinking at least 64 ounces of water daily.  Sleeping with a humidifier may help moisten the air at night, can do over-the-counter cough drops as well.  Return to clinic if you develop fever, no improvement, or new concerning symptoms.

## 2024-08-12 NOTE — ED Triage Notes (Signed)
 Pt reports cough for last several weeks. Last use of inhaler last night. NAD noted. Pt reports symptoms are worse at night.

## 2024-08-17 ENCOUNTER — Ambulatory Visit: Admitting: Allergy & Immunology

## 2024-08-26 ENCOUNTER — Ambulatory Visit
Admission: EM | Admit: 2024-08-26 | Discharge: 2024-08-26 | Disposition: A | Discharge status: Home / Self Care | Attending: Family Medicine | Admitting: Family Medicine

## 2024-08-26 DIAGNOSIS — J4541 Moderate persistent asthma with (acute) exacerbation: Secondary | ICD-10-CM

## 2024-08-26 DIAGNOSIS — J3089 Other allergic rhinitis: Secondary | ICD-10-CM | POA: Diagnosis not present

## 2024-08-26 MED ORDER — CETIRIZINE HCL 10 MG PO TABS: 10.0000 mg | ORAL_TABLET | Freq: Every day | ORAL | 2 refills | Status: AC

## 2024-08-26 MED ORDER — DEXAMETHASONE SOD PHOSPHATE PF 10 MG/ML IJ SOLN
10.0000 mg | Freq: Once | INTRAMUSCULAR | Status: AC
  Administered 2024-08-26: 10 mg via INTRAMUSCULAR

## 2024-08-26 MED ORDER — AZELASTINE HCL 0.1 % NA SOLN: 1.0000 | Freq: Two times a day (BID) | NASAL | 2 refills | Status: AC

## 2024-08-26 NOTE — ED Provider Notes (Signed)
 [facility]    CSN: [id number] Arrival date & time: 08/26/24  1045      History   Chief Complaint No chief complaint on file.   HPI Erik Ellis is a 58 y.o. male.   Patient presenting today with several day history of recurring dry cough, wheezing, chest tightness, runny nose.  Denies fever, chills, chest pain, shortness of breath, abdominal pain, vomiting, diarrhea.  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  Has Trelegy daily for asthma and has been using his albuterol  with very mild bouts of improvement in symptoms.  Otherwise not trying anything over-the-counter for symptoms and does not currently take anything for seasonal allergies.    Past Medical History:  Diagnosis Date   Asthma    Depression    Hypertension     Patient Active Problem List   Diagnosis Date Noted   Non-healing skin lesion 04/05/2024   Asthma exacerbation 04/05/2024   Involuntary movements 12/17/2023   Stage 3a chronic kidney disease (HCC) 09/30/2022   Essential hypertension 09/24/2021   Hyperlipidemia 09/24/2021   Mild neurocognitive disorder due to multiple etiologies 03/20/2021   S/P cervical spinal fusion 02/21/2021   Moderate persistent asthma without complication 05/12/2020   Allergic rhinitis 02/17/2016   Bipolar disorder (HCC) 05/31/2015   Gastroesophageal reflux disease 05/31/2015   Sleep disorder 12/04/2011    Past Surgical History:  Procedure Laterality Date   CHOLECYSTECTOMY     KNEE ARTHROSCOPY     LUMBAR DISC SURGERY     NECK SURGERY     POSTERIOR CERVICAL FUSION/FORAMINOTOMY N/A 02/21/2021   Procedure: Posterior cervical fusion with lateral mass fixation - Cervical Ficve-Six/Cervical Six-Seven;  Surgeon: Joshua Alm RAMAN, MD;  Location: [facility] OR;  Service: Neurosurgery;  Laterality: N/A;   REPLACEMENT  TOTAL KNEE         Home Medications    Prior to Admission medications   Medication Sig Start Date End Date Taking? Authorizing Provider  azelastine (ASTELIN) 0.1 % nasal spray Place 1 spray into both nostrils 2 (two) times daily. Use in each nostril as directed 08/26/24  Yes Stuart Vernell Norris, PA-C  cetirizine (ZYRTEC ALLERGY ) 10 MG tablet Take 1 tablet (10 mg total) by mouth daily. 08/26/24  Yes Stuart Vernell Norris, PA-C  albuterol  (PROVENTIL ) (2.5 MG/3ML) 0.083% nebulizer solution Take 3 mLs (2.5 mg total) by nebulization every 6 (six) hours as needed for shortness of breath or wheezing. 10/06/23   Tobie Arleta SQUIBB, MD  albuterol  (VENTOLIN  HFA) 108 (90 Base) MCG/ACT inhaler Inhale 2 puffs into the lungs every 4 (four) hours as needed. For shortness of breath/wheezing 10/06/23   Tobie Arleta SQUIBB, MD  amLODipine  (NORVASC ) 5 MG tablet Take 1 tablet (5 mg total) by mouth daily. 10/07/23   Cook, Jayce G, DO  amphetamine -dextroamphetamine  (ADDERALL) 20 MG tablet Take 20 mg by mouth 2 (two) times daily. 09/08/23   [provider]  Ascorbic Acid (VITAMIN C) 1000 MG tablet Take 1,000 mg by mouth daily.    [provider]  atorvastatin  (LIPITOR) 20 MG tablet Take 1 tablet (20 mg total) by mouth daily. 10/07/23   Cook, Jayce G, DO  benzonatate (TESSALON) 100 MG capsule Take 1 capsule (100 mg total) by mouth every 8 (eight) hours. 08/12/24   Dreama, [place]  N, FNP  budesonide  (PULMICORT ) 0.5 MG/2ML nebulizer solution  During asthma flares use 1 vial via nebulizer 2 times a day for 1-2 weeks in addition to Trelegy inhaler 02/18/24   Cheryl Reusing, FNP  buPROPion  (WELLBUTRIN  XL) 150 MG 24 hr tablet Take 450 mg by mouth daily. 05/08/21   [provider]  busPIRone  (BUSPAR ) 30 MG tablet Take 30 mg by mouth 2 (two) times daily. 07/24/14   [provider]  famotidine  (PEPCID ) 40 MG tablet TAKE 1 TABLET BY MOUTH EVERY DAY 06/08/24   Iva Marty Saltness, MD  fluticasone   (FLONASE ) 50 MCG/ACT nasal spray Place 1 spray into both nostrils daily. 03/27/23 09/29/24  Iva Marty Saltness, MD  Fluticasone -Umeclidin-Vilant (TRELEGY ELLIPTA ) 200-62.5-25 MCG/ACT AEPB Inhale 1 puff into the lungs daily. 05/18/24   Iva Marty Saltness, MD  gabapentin  (NEURONTIN ) 300 MG capsule Take 300 mg by mouth at bedtime. 03/24/16   [provider]  lamoTRIgine  (LAMICTAL ) 200 MG tablet Take 200 mg by mouth 2 (two) times daily. 08/14/14   [provider]  lithium carbonate (LITHOBID) 300 MG ER tablet Take 300 mg by mouth in the morning, at noon, and at bedtime. 09/06/23   [provider]  lurasidone (LATUDA) 20 MG TABS tablet Take 20 mg by mouth daily. 12/30/23   [provider]  montelukast  (SINGULAIR ) 10 MG tablet Take 1 tablet (10 mg total) by mouth at bedtime. 05/18/24   Iva Marty Saltness, MD  Multiple Vitamins-Minerals (MULTIVITAMIN WITH MINERALS) tablet Take 1 tablet by mouth daily.    [provider]  Omega-3 Fatty Acids (FISH OIL) 1000 MG CAPS Take 2,000 mg by mouth daily.    [provider]  pantoprazole  (PROTONIX ) 40 MG tablet TAKE 1 TABLET BY MOUTH EVERY DAY 06/08/24   Cook, Jayce G, DO  sildenafil (VIAGRA) 100 MG tablet Take by mouth. 10/11/23   [provider]  Tezepelumab -ekko (TEZSPIRE ) 210 MG/1. SOAJ Inject 210 mg into the skin every 28 (twenty-eight) days. 06/18/24   Kozlow, Camellia PARAS, MD  traZODone (DESYREL) 50 MG tablet Take 50 mg by mouth at bedtime as needed for sleep. 09/02/23   [provider]  valbenazine (INGREZZA) 40 MG capsule Take 40 mg by mouth daily.    [provider]  XIIDRA  5 % SOLN Place 1 drop into both eyes in the morning and at bedtime. 08/13/18   [provider]  zolpidem (AMBIEN) 10 MG tablet Take 5-10 mg by mouth at bedtime.  07/26/14   [provider]    Family History Family History  Problem Relation Age of Onset   Asthma Father 54       bile duct  cancer   Healthy Sister    Other Sister        mva   Healthy Child    Allergic rhinitis Neg Hx    Angioedema Neg Hx    Atopy Neg Hx    Eczema Neg Hx    Immunodeficiency Neg Hx    Urticaria Neg Hx    Thyroid  disease Neg Hx     Social History Social History   Tobacco Use   Smoking status: Never   Smokeless tobacco: Never  Vaping Use   Vaping status: Never Used  Substance Use Topics   Alcohol use: Yes    Comment: less than 1 time per month   Drug use: No     Allergies   Patient has no known allergies.   Review of Systems Review of Systems PER HPI  Physical Exam Triage Vital Signs ED Triage  Vitals  Encounter Vitals Group     BP 08/26/24 1114 139/78     Girls Systolic BP Percentile --      Girls Diastolic BP Percentile --      Boys Systolic BP Percentile --      Boys Diastolic BP Percentile --      Pulse Rate 08/26/24 1114 92     Resp 08/26/24 1114 16     Temp 08/26/24 1114 98.6 F (37 C)     Temp Source 08/26/24 1114 Oral     SpO2 08/26/24 1114 98 %     Weight --      Height --      Head Circumference --      Peak Flow --      Pain Score 08/26/24 1118 0     Pain Loc --      Pain Education --      Exclude from Growth Chart --    No data found.  Updated Vital Signs BP 139/78 (BP Location: Right Arm)   Pulse 92   Temp 98.6 F (37 C) (Oral)   Resp 16   SpO2 98%   Visual Acuity Right Eye Distance:   Left Eye Distance:   Bilateral Distance:    Right Eye Near:   Left Eye Near:    Bilateral Near:     Physical Exam Vitals and nursing note reviewed.  Constitutional:      Appearance: He is well-developed.  HENT:     Head: Atraumatic.     Right Ear: External ear normal.     Left Ear: External ear normal.     Nose: Rhinorrhea present.     Mouth/Throat:     Mouth: Mucous membranes are moist.     Pharynx: Oropharynx is clear. No oropharyngeal exudate or posterior oropharyngeal erythema.  Eyes:     Conjunctiva/sclera: Conjunctivae normal.      Pupils: Pupils are equal, round, and reactive to light.  Cardiovascular:     Rate and Rhythm: Normal rate and regular rhythm.     Heart sounds: Normal heart sounds.  Pulmonary:     Effort: Pulmonary effort is normal. No respiratory distress.     Breath sounds: Wheezing present. No rales.  Musculoskeletal:        General: Normal range of motion.     Cervical back: Normal range of motion and neck supple.  Lymphadenopathy:     Cervical: No cervical adenopathy.  Skin:    General: Skin is warm and dry.  Neurological:     Mental Status: He is alert and oriented to person, place, and time.  Psychiatric:        Behavior: Behavior normal.    UC Treatments / Results  Labs (all labs ordered are listed, but only abnormal results are displayed) Labs Reviewed - No data to display  EKG   Radiology No results found.  Procedures Procedures (including critical care time)  Medications Ordered in UC Medications  dexamethasone  (DECADRON ) injection 10 mg (10 mg Intramuscular Given 08/26/24 1156)    Initial Impression / Assessment and Plan / UC Course  I have reviewed the triage vital signs and the nursing notes.  Pertinent labs & imaging results that were available during my care of the patient were reviewed by me and considered in my medical decision making (see chart for details).     Consistent with seasonal allergy  and asthma exacerbation.  Will start allergy  regimen of Zyrtec and Astelin daily and treat  asthma with IM Decadron , increased use of albuterol , continue Trelegy.  Discussed supportive over-the-counter medications at home care.  Return for worsening symptoms.  Final Clinical Impressions(s) / UC Diagnoses   Final diagnoses:  Moderate persistent asthma with exacerbation  Seasonal allergic rhinitis due to other allergic trigger   Discharge Instructions   None    ED Prescriptions     Medication Sig Dispense Auth. Provider   cetirizine (ZYRTEC ALLERGY ) 10 MG tablet  Take 1 tablet (10 mg total) by mouth daily. 30 tablet Stuart Vernell Norris, PA-C   azelastine (ASTELIN) 0.1 % nasal spray Place 1 spray into both nostrils 2 (two) times daily. Use in each nostril as directed 30 mL Stuart Vernell Norris, PA-C      PDMP not reviewed this encounter.   Stuart Vernell Norris, PA-C 08/26/24 1237

## 2024-08-26 NOTE — ED Triage Notes (Signed)
 Pt reports cough, was seen a few weeks ago. 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

## 2024-08-31 ENCOUNTER — Encounter: Payer: Self-pay | Admitting: Dermatology

## 2024-08-31 ENCOUNTER — Ambulatory Visit: Admitting: Dermatology

## 2024-08-31 VITALS — BP 145/79 | HR 70

## 2024-08-31 DIAGNOSIS — D485 Neoplasm of uncertain behavior of skin: Secondary | ICD-10-CM

## 2024-08-31 DIAGNOSIS — C44612 Basal cell carcinoma of skin of right upper limb, including shoulder: Secondary | ICD-10-CM

## 2024-08-31 DIAGNOSIS — C4491 Basal cell carcinoma of skin, unspecified: Secondary | ICD-10-CM

## 2024-08-31 HISTORY — DX: Basal cell carcinoma of skin, unspecified: C44.91

## 2024-08-31 NOTE — Patient Instructions (Addendum)

## 2024-08-31 NOTE — Progress Notes (Signed)
   New Patient Visit   Subjective  Erik Ellis is a 58 y.o. male who presents for the following: skin lesion   Patient states he  has skin lesion located at the right forearm that he  would like to have examined. Patient reports the areas have been there for 2 years. He reports the areas are not bothersome. He states that the areas have not spread. Patient reports he  has not previously been treated for these areas. Patient denies Hx of bx. Patient denis family history of skin cancer(s).  The following portions of the chart were reviewed this encounter and updated as appropriate: medications, allergies, medical history  Review of Systems:  No other skin or systemic complaints except as noted in HPI or Assessment and Plan.  Objective  Well appearing patient in no apparent distress; mood and affect are within normal limits.   A focused examination was performed of the following areas: Arms  Relevant exam findings are noted in the Assessment and Plan.      Right Forearm - Anterior 0.6 pink crusted papule   Assessment & Plan   NEOPLASM OF UNCERTAIN BEHAVIOR OF SKIN Right Forearm - Anterior Skin / nail biopsy Type of biopsy: tangential   Informed consent: discussed and consent obtained   Timeout: patient name, date of birth, surgical site, and procedure verified   Procedure prep:  Patient was prepped and draped in usual sterile fashion Prep type:  Chlorhexidine  Anesthesia: the lesion was anesthetized in a standard fashion   Anesthetic:  1% lidocaine  w/ epinephrine  1-100,000 buffered w/ 8.4% NaHCO3 Instrument used: DermaBlade   Hemostasis achieved with: aluminum chloride   Outcome: patient tolerated procedure well   Post-procedure details: sterile dressing applied and wound care instructions given   Dressing type: bandage and pressure dressing    Specimen 1 - Surgical pathology Differential Diagnosis: NMSC VS INFECTION  Check Margins: No  Return if symptoms worsen or  fail to improve.  I, Doyce Pan, CMA, am acting as scribe for RUFUS CHRISTELLA HOLY, MD.   Documentation: I have reviewed the above documentation for accuracy and completeness, and I agree with the above.  RUFUS CHRISTELLA HOLY, MD

## 2024-09-01 ENCOUNTER — Ambulatory Visit: Payer: Self-pay | Admitting: Dermatology

## 2024-09-01 LAB — SURGICAL PATHOLOGY

## 2024-09-21 ENCOUNTER — Encounter: Payer: Self-pay | Admitting: Allergy & Immunology

## 2024-09-21 ENCOUNTER — Ambulatory Visit: Admitting: Allergy & Immunology

## 2024-09-21 VITALS — BP 126/74 | HR 85 | Ht 69.0 in | Wt 168.0 lb

## 2024-09-21 DIAGNOSIS — J4550 Severe persistent asthma, uncomplicated: Secondary | ICD-10-CM | POA: Diagnosis not present

## 2024-09-21 DIAGNOSIS — J387 Other diseases of larynx: Secondary | ICD-10-CM

## 2024-09-21 DIAGNOSIS — R682 Dry mouth, unspecified: Secondary | ICD-10-CM

## 2024-09-21 DIAGNOSIS — K219 Gastro-esophageal reflux disease without esophagitis: Secondary | ICD-10-CM

## 2024-09-21 DIAGNOSIS — J209 Acute bronchitis, unspecified: Secondary | ICD-10-CM

## 2024-09-21 MED ORDER — BENZONATATE 100 MG PO CAPS: 100.0000 mg | ORAL_CAPSULE | Freq: Three times a day (TID) | ORAL | 0 refills | Status: AC | PRN

## 2024-09-21 MED ORDER — PREDNISONE 10 MG PO TABS: ORAL_TABLET | ORAL | 0 refills | Status: AC

## 2024-09-21 MED ORDER — AZITHROMYCIN 250 MG PO TABS: ORAL_TABLET | ORAL | 1 refills | Status: AC

## 2024-09-21 NOTE — Progress Notes (Signed)
 FOLLOW UP  Date of Service/Encounter:  09/21/24   Assessment:   Moderate persistent asthma with a component of vocal cord dysfunction - starting azithromycin  + prednisone  today, although I am not convinced that he needs the prednisone    Dysphonia - needs Speech Therapy   Alpha-1 antitrpysin and Aspergillus precipitins - all normal   Non-allergic rhinitis   COVID19 long hauler - doing better on Tezspire    Bipolar disorder - on lithium and Latuda as well as lamotrigine  and gabapentin    Chronic neck pain with surgery ten years ago and now with DDD   [employer] (made the first game of playoffs this year)   Plan/Recommendations:   1. Moderate persistent asthma - with possible vocal cord dysfunction - Lung testing looks EXCELLENT today (well over 100%). - We are going to add on azithromycin  to help with any co-existing infection. - This has anti-inflammatory activity as well, so maybe that will help as well.  - I would like to refer you to see Speech Therapy for voice therapy just in case this is vocal cord dysfunction.  - I am going to send you to see Pulmonology for another evaluation to see if there is something else contributing to your coughing episodes. - Start prednisone  taper.  - Daily controller medication(s): Singulair  10mg  daily and Trelegy 200/62.5/25 one puff once daily and Tezspire  monthly - Prior to physical activity: albuterol  2 puffs 10-15 minutes before physical activity. - Rescue medications: Albuterol  1-2 puffs or 1 vial nebulizer every 4-6 hours as needed for wheezing/shortness of breath.  - Asthma control goals:  * Full participation in all desired activities (may need albuterol  before activity) * Albuterol  use two time or less a week on average (not counting use with activity) * Cough interfering with sleep two time or less a month * Oral steroids no more than once a year * No hospitalizations  2. GERD  - Continue Pantoprazole  40 mg daily. -  Use Pepcid  40mg  daily.    3. Chronic Rhinitis - Continue with: Flonase  (fluticasone ) one spray per nostril daily   4. Return in about 3 months (around 12/22/2024). You can have the follow up appointment with Dr. Iva or a Nurse Practicioner (our Nurse Practitioners are excellent and always have Physician oversight!).   Subjective:   Erik Ellis is a 58 y.o. male presenting today for follow up of  Chief Complaint  Patient presents with   Asthma issues    Pt stated went to urgent care --1 month ago.    Erik Ellis has a history of the following: Patient Active Problem List   Diagnosis Date Noted   Non-healing skin lesion 04/05/2024   Asthma exacerbation 04/05/2024   Involuntary movements 12/17/2023   Stage 3a chronic kidney disease (HCC) 09/30/2022   Essential hypertension 09/24/2021   Hyperlipidemia 09/24/2021   Mild neurocognitive disorder due to multiple etiologies 03/20/2021   S/P cervical spinal fusion 02/21/2021   Moderate persistent asthma without complication 05/12/2020   Allergic rhinitis 02/17/2016   Bipolar disorder (HCC) 05/31/2015   Gastroesophageal reflux disease 05/31/2015   Sleep disorder 12/04/2011    History obtained from: chart review and patient.  Discussed the use of AI scribe software for clinical note transcription with the patient and/or guardian, who gave verbal consent to proceed.  Erik Ellis is a 58 y.o. male presenting for a follow up visit.  He was last seen in July 2025.  At that time, lung testing looked great.  We  did not make any changes.  We talked about adding a second biologic, but decided not to pursue that at that time.  For his asthma, we continue with Singulair  10 mg daily and Trelegy 200 mcg 1 puff daily.  He also remained on Tezspire  monthly.  For his reflux, he continue with Protonix  as well as Pepcid .  Since the last visit, he has been about the same. He has been ahving issues with his breathing since October.   He  experiences persistent cough and shortness of breath, which he believes are exacerbated by yelling at games and exposure to cold air. He visited urgent care twice in October, receiving prednisone  initially, which improved symptoms temporarily, and later a steroid shot, which also provided temporary relief.  He is currently on Trelegy and Tezspire  for asthma management, yet continues to experience persistent symptoms. Despite these treatments, his lung function tests remain consistently good, even during asthma symptoms.   In April, he received prednisone  and underwent a vocal cord evaluation in [place], which did not lead to further intervention. He has a history of thrush, previously treated with nystatin , but has not experienced it recently.  Reviewed the note from March 2025.  At that time, he wanted to hold off on speech therapy.  He told ENT that he was only experiencing these symptoms 1 time per year.  He was started on nystatin  4 times a day for 14 days to treat the presumed Candida infection. He has never had the speech therapy. He denies recent fractures and is not really concerned about the side effects of frequent steroid use.  A chest X-ray earlier this year was normal, and a past chest CT due to elevated D-dimer was also normal.  The chest CT was back in November 2024. He experienced broken ribs from a fall while packing for Christmas, unrelated to his current respiratory issues.  He underwent neck surgery in the past, resolving daily headaches.   He continues to take pantoprazole  and Pepcid  for heartburn. He has not been on antibiotics recently and cannot recall the last time he took them.   He reports chest pain along the side and confirms taking his reflux medications regularly.     Otherwise, there have been no changes to his past medical history, surgical history, family history, or social history.    Review of systems otherwise negative other than that mentioned in the  HPI.    Objective:   Blood pressure 126/74, pulse 85, height 5' 9 (1.753 m), weight 168 lb (76.2 kg), SpO2 97%. Body mass index is 24.81 kg/m.    Physical Exam Vitals reviewed.  Constitutional:      Appearance: Normal appearance. He is well-developed.     Comments: Anxious.  HENT:     Head: Normocephalic and atraumatic.     Right Ear: Tympanic membrane, ear canal and external ear normal.     Left Ear: Tympanic membrane, ear canal and external ear normal.     Nose: No nasal deformity, septal deviation, mucosal edema or rhinorrhea.     Right Turbinates: Enlarged, swollen and pale.     Left Turbinates: Enlarged, swollen and pale.     Right Sinus: No maxillary sinus tenderness or frontal sinus tenderness.     Left Sinus: No maxillary sinus tenderness or frontal sinus tenderness.     Mouth/Throat:     Lips: Pink.     Mouth: Mucous membranes are moist. Mucous membranes are pale and not dry.  Pharynx: Oropharynx is clear. Uvula midline.     Comments: Cobblestoning in the posterior oropharynx. Eyes:     General: Lids are normal. No allergic shiner.       Right eye: No discharge.        Left eye: No discharge.     Conjunctiva/sclera: Conjunctivae normal.     Right eye: Right conjunctiva is not injected. No chemosis.    Left eye: Left conjunctiva is not injected. No chemosis.    Pupils: Pupils are equal, round, and reactive to light.  Cardiovascular:     Rate and Rhythm: Normal rate and regular rhythm.     Heart sounds: Normal heart sounds.  Pulmonary:     Effort: Pulmonary effort is normal. No tachypnea, accessory muscle usage or respiratory distress.     Breath sounds: No transmitted upper airway sounds. No decreased breath sounds, wheezing, rhonchi or rales.     Comments: Moving air well in all lung fields.  No increased work of breathing.  Speaking in full sentences. Chest:     Chest wall: No tenderness.  Lymphadenopathy:     Cervical: No cervical adenopathy.  Skin:     Coloration: Skin is not pale.     Findings: No abrasion, erythema, petechiae or rash. Rash is not papular, urticarial or vesicular.  Neurological:     Mental Status: He is alert.  Psychiatric:        Behavior: Behavior is cooperative.      Diagnostic studies:    Spirometry: results normal (FEV1: 3.75/116%, FVC: 4.40/106%, FEV1/FVC: 85%).    Spirometry consistent with normal pattern.   Allergy  Studies: none       Marty Shaggy, MD  [facility] 

## 2024-09-21 NOTE — Patient Instructions (Addendum)
 1. Moderate persistent asthma - with possible vocal cord dysfunction - Lung testing looks EXCELLENT today (well over 100%). - We are going to add on azithromycin  to help with any co-existing infection. - This has anti-inflammatory activity as well, so maybe that will help as well.  - I would like to refer you to see Speech Therapy for voice therapy just in case this is vocal cord dysfunction.  - I am going to send you to see Pulmonology for another evaluation to see if there is something else contributing to your coughing episodes. - Start prednisone  taper.  - Daily controller medication(s): Singulair  10mg  daily and Trelegy 200/62.5/25 one puff once daily and Tezspire  monthly - Prior to physical activity: albuterol  2 puffs 10-15 minutes before physical activity. - Rescue medications: Albuterol  1-2 puffs or 1 vial nebulizer every 4-6 hours as needed for wheezing/shortness of breath.  - Asthma control goals:  * Full participation in all desired activities (may need albuterol  before activity) * Albuterol  use two time or less a week on average (not counting use with activity) * Cough interfering with sleep two time or less a month * Oral steroids no more than once a year * No hospitalizations  2. GERD  - Continue Pantoprazole  40 mg daily. - Use Pepcid  40mg  daily.    3. Chronic Rhinitis - Continue with: Flonase  (fluticasone ) one spray per nostril daily   4. Return in about 3 months (around 12/22/2024). You can have the follow up appointment with Dr. Iva or a Nurse Practicioner (our Nurse Practitioners are excellent and always have Physician oversight!).    Please inform us  of any Emergency Department visits, hospitalizations, or changes in symptoms. Call us  before going to the ED for breathing or allergy  symptoms since we might be able to fit you in for a sick visit. Feel free to contact us  anytime with any questions, problems, or concerns.  It was a pleasure to see you again  today!  Websites that have reliable patient information: 1. American Academy of Asthma, Allergy , and Immunology: www.aaaai.org 2. Food Allergy  Research and Education (FARE): foodallergy.org 3. Mothers of Asthmatics: http://www.asthmacommunitynetwork.org 4. American College of Allergy , Asthma, and Immunology: www.acaai.org      "Like" us  on Facebook and Instagram for our latest updates!      A healthy democracy works best when [employer] participate! Make sure you are registered to vote! If you have moved or changed any of your contact information, you will need to get this updated before voting! Scan the QR codes below to learn more!

## 2024-10-11 ENCOUNTER — Encounter: Payer: Self-pay | Admitting: Allergy & Immunology

## 2024-10-18 ENCOUNTER — Ambulatory Visit

## 2024-10-19 ENCOUNTER — Ambulatory Visit (HOSPITAL_COMMUNITY): Admitting: Speech Pathology

## 2024-10-19 NOTE — Progress Notes (Signed)
 New Patient Pulmonology Office Visit   Subjective:  Patient ID: Erik Ellis, male    DOB: [birth year]  MRN: [id number]  Referred by: Cook, Jayce G, DO  CC: No chief complaint on file.   HPI Erik Ellis is a 58 y.o. male with moderate persisten asthma, VCD   {PULM QUESTIONNAIRES (Optional):33196}  ROS  Allergies: Patient has no known allergies. Current Medications[1] Past Medical History:  Diagnosis Date   Asthma    Basal cell carcinoma 08/31/2024   Right forearm - needs WLE   Depression    Hypertension    Past Surgical History:  Procedure Laterality Date   CHOLECYSTECTOMY     KNEE ARTHROSCOPY     LUMBAR DISC SURGERY     NECK SURGERY     POSTERIOR CERVICAL FUSION/FORAMINOTOMY N/A 02/21/2021   Procedure: Posterior cervical fusion with lateral mass fixation - Cervical Ficve-Six/Cervical Six-Seven;  Surgeon: Joshua Alm RAMAN, MD;  Location: [facility] OR;  Service: Neurosurgery;  Laterality: N/A;   REPLACEMENT TOTAL KNEE     Family History  Problem Relation Age of Onset   Asthma Father 25       bile duct cancer   Healthy Sister    Other Sister        mva   Healthy Child    Allergic rhinitis Neg Hx    Angioedema Neg Hx    Atopy Neg Hx    Eczema Neg Hx    Immunodeficiency Neg Hx    Urticaria Neg Hx    Thyroid  disease Neg Hx    Social History   Socioeconomic History   Marital status: Married    Spouse name: Not on file   Number of children: Not on file   Years of education: Not on file   Highest education level: Master's degree (e.g., MA, MS, MEng, MEd, MSW, MBA)  Occupational History   Occupation: runner, 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  Tobacco Use   Smoking status: Never   Smokeless tobacco: Never  Vaping Use   Vaping status: Never Used  Substance and Sexual Activity   Alcohol use: Yes    Comment: less than 1 time per month   Drug use: No   Sexual activity: Not on file  Other Topics Concern   Not on file  Social History Narrative    Not on file   Social Drivers of Health   Tobacco Use: Low Risk (09/21/2024)   Patient History    Smoking Tobacco Use: Never    Smokeless Tobacco Use: Never    Passive Exposure: Not on file  Financial Resource Strain: Low Risk (10/23/2023)   Overall Financial Resource Strain (CARDIA)    Difficulty of Paying Living Expenses: Not hard at all  Food Insecurity: No Food Insecurity (10/23/2023)   Hunger Vital Sign    Worried About Running Out of Food in the Last Year: Never true    Ran Out of Food in the Last Year: Never true  Transportation Needs: No Transportation Needs (10/23/2023)   PRAPARE - Administrator, Civil Service (Medical): No    Lack of Transportation (Non-Medical): No  Physical Activity: Sufficiently Active (10/23/2023)   Exercise Vital Sign    Days of Exercise per Week: 5 days    Minutes of Exercise per Session: 30 min  Stress: No Stress Concern Present (10/23/2023)   [employer] of Occupational Health - Occupational Stress Questionnaire    Feeling of Stress : Only a little  Social  Connections: Unknown (10/23/2023)   Social Connection and Isolation Panel    Frequency of Communication with Friends and Family: Once a week    Frequency of Social Gatherings with Friends and Family: Once a week    Attends Religious Services: More than 4 times per year    Active Member of [employer] or Organizations: No    Attends Engineer, Structural: Not on file    Marital Status: Not on file  Recent Concern: Social Connections - Moderately Isolated (10/23/2023)   Social Connection and Isolation Panel    Frequency of Communication with Friends and Family: Once a week    Frequency of Social Gatherings with Friends and Family: Once a week    Attends Religious Services: More than 4 times per year    Active Member of [employer] or Organizations: No    Attends Engineer, Structural: Not on file    Marital Status: Married  Intimate Partner Violence: Unknown  (02/06/2022)   Received from [facility]   HITS    Physically Hurt: Not on file    Insult or Talk Down To: Not on file    Threaten Physical Harm: Not on file    Scream or Curse: Not on file  Depression (PHQ2-9): Low Risk (04/05/2024)   Depression (PHQ2-9)    PHQ-2 Score: 0  Alcohol Screen: Low Risk (10/23/2023)   Alcohol Screen    Last Alcohol Screening Score (AUDIT): 0  Housing: Low Risk (10/23/2023)   Housing Stability Vital Sign    Unable to Pay for Housing in the Last Year: No    Number of Times Moved in the Last Year: 0    Homeless in the Last Year: No  Utilities: Not on file  Health Literacy: Not on file     Objective:  There were no vitals taken for this visit. {Pulm Vitals (Optional):32837}  Physical Exam  Diagnostic Review:  {Labs (Optional):32838} A1AT - neg  Spirometry normal: FEV1: 3.75/116%, FVC: 4.40/106%, FEV1/FVC: 85%).   CTAngio 09/2023    No pulmonary embolism. No acute intrathoracic pathology Identified. Normal lungs. Assessment & Plan:   Assessment & Plan   Voice therapy for speech therapy. Singulair  and albuterol   On PPIi Start on prednisone   No follow-ups on file.    Marny Patch, MD Pulmonary and Critical Care Medicine [facility]    [1]  Current Outpatient Medications:    albuterol  (PROVENTIL ) (2.5 MG/3ML) 0.083% nebulizer solution, Take 3 mLs (2.5 mg total) by nebulization every 6 (six) hours as needed for shortness of breath or wheezing., Disp: 75 mL, Rfl: 1   albuterol  (VENTOLIN  HFA) 108 (90 Base) MCG/ACT inhaler, Inhale 2 puffs into the lungs every 4 (four) hours as needed. For shortness of breath/wheezing, Disp: 18 g, Rfl: 1   amLODipine  (NORVASC ) 5 MG tablet, Take 1 tablet (5 mg total) by mouth daily., Disp: 90 tablet, Rfl: 3   amphetamine -dextroamphetamine  (ADDERALL) 20 MG tablet, Take 20 mg by mouth 2 (two) times daily., Disp: , Rfl:    Ascorbic Acid (VITAMIN C) 1000 MG tablet, Take 1,000 mg by mouth  daily., Disp: , Rfl:    atorvastatin  (LIPITOR) 20 MG tablet, Take 1 tablet (20 mg total) by mouth daily., Disp: 90 tablet, Rfl: 3   azelastine  (ASTELIN ) 0.1 % nasal spray, Place 1 spray into both nostrils 2 (two) times daily. Use in each nostril as directed, Disp: 30 mL, Rfl: 2   azithromycin  (ZITHROMAX ) 250 MG tablet, Take two tablets on  the first day and then one tablet daily for four more days. Repeat in one week if needed., Disp: 6 each, Rfl: 1   benzonatate  (TESSALON ) 100 MG capsule, Take 1 capsule (100 mg total) by mouth every 8 (eight) hours., Disp: 30 capsule, Rfl: 0   benzonatate  (TESSALON ) 100 MG capsule, Take 1 capsule (100 mg total) by mouth 3 (three) times daily as needed for cough., Disp: 20 capsule, Rfl: 0   budesonide  (PULMICORT ) 0.5 MG/2ML nebulizer solution, During asthma flares use 1 vial via nebulizer 2 times a day for 1-2 weeks in addition to Trelegy inhaler, Disp: 120 mL, Rfl: 3   buPROPion  (WELLBUTRIN  XL) 150 MG 24 hr tablet, Take 450 mg by mouth daily., Disp: , Rfl:    busPIRone  (BUSPAR ) 30 MG tablet, Take 30 mg by mouth 2 (two) times daily., Disp: , Rfl:    cetirizine  (ZYRTEC  ALLERGY ) 10 MG tablet, Take 1 tablet (10 mg total) by mouth daily., Disp: 30 tablet, Rfl: 2   famotidine  (PEPCID ) 40 MG tablet, TAKE 1 TABLET BY MOUTH EVERY DAY, Disp: 90 tablet, Rfl: 1   fluticasone  (FLONASE ) 50 MCG/ACT nasal spray, Place 1 spray into both nostrils daily., Disp: 48 mL, Rfl: 5   Fluticasone -Umeclidin-Vilant (TRELEGY ELLIPTA ) 200-62.5-25 MCG/ACT AEPB, Inhale 1 puff into the lungs daily., Disp: 60 each, Rfl: 5   gabapentin  (NEURONTIN ) 300 MG capsule, Take 300 mg by mouth at bedtime., Disp: , Rfl: 4   lamoTRIgine  (LAMICTAL ) 200 MG tablet, Take 200 mg by mouth 2 (two) times daily., Disp: , Rfl:    lithium carbonate (LITHOBID) 300 MG ER tablet, Take 300 mg by mouth in the morning, at noon, and at bedtime., Disp: , Rfl:    lurasidone (LATUDA) 20 MG TABS tablet, Take 20 mg by mouth daily.,  Disp: , Rfl:    montelukast  (SINGULAIR ) 10 MG tablet, Take 1 tablet (10 mg total) by mouth at bedtime., Disp: 90 tablet, Rfl: 1   Multiple Vitamins-Minerals (MULTIVITAMIN WITH MINERALS) tablet, Take 1 tablet by mouth daily., Disp: , Rfl:    Omega-3 Fatty Acids (FISH OIL) 1000 MG CAPS, Take 2,000 mg by mouth daily., Disp: , Rfl:    pantoprazole  (PROTONIX ) 40 MG tablet, TAKE 1 TABLET BY MOUTH EVERY DAY, Disp: 90 tablet, Rfl: 1   predniSONE  (DELTASONE ) 10 MG tablet, Take two tablets (20mg ) twice daily for six days, then one tablet (10mg ) twice daily for six days, then STOP., Disp: 36 tablet, Rfl: 0   sildenafil (VIAGRA) 100 MG tablet, Take by mouth., Disp: , Rfl:    Tezepelumab -ekko (TEZSPIRE ) 210 MG/1. SOAJ, Inject 210 mg into the skin every 28 (twenty-eight) days., Disp: 1.91 mL, Rfl: 11   traZODone (DESYREL) 50 MG tablet, Take 50 mg by mouth at bedtime as needed for sleep., Disp: , Rfl:    valbenazine (INGREZZA) 40 MG capsule, Take 40 mg by mouth daily., Disp: , Rfl:    XIIDRA  5 % SOLN, Place 1 drop into both eyes in the morning and at bedtime., Disp: , Rfl: 0   zolpidem (AMBIEN) 10 MG tablet, Take 5-10 mg by mouth at bedtime. , Disp: , Rfl:

## 2024-10-20 ENCOUNTER — Ambulatory Visit

## 2024-10-20 VITALS — BP 154/81 | HR 74 | Ht 68.0 in | Wt 169.6 lb

## 2024-10-20 DIAGNOSIS — J4540 Moderate persistent asthma, uncomplicated: Secondary | ICD-10-CM | POA: Diagnosis not present

## 2024-10-20 NOTE — Patient Instructions (Addendum)
 Dear Erik Ellis,   For your asthma, I will recommend the following: -Continue Trelegy daily. -I will check a blood test today to see if I can change the biological therapy to Dupixent.  -I will order a pulmonary function test as well. -I will see you in 3 months.

## 2024-10-21 LAB — CBC WITH DIFFERENTIAL/PLATELET
Basophils Absolute: 0.1 K/uL (ref 0.0–0.1)
Basophils Relative: 0.8 % (ref 0.0–3.0)
Eosinophils Absolute: 0.2 K/uL (ref 0.0–0.7)
Eosinophils Relative: 2.4 % (ref 0.0–5.0)
HCT: 38.8 % — ABNORMAL LOW (ref 39.0–52.0)
Hemoglobin: 13 g/dL (ref 13.0–17.0)
Lymphocytes Relative: 13.7 % (ref 12.0–46.0)
Lymphs Abs: 0.9 K/uL (ref 0.7–4.0)
MCHC: 33.4 g/dL (ref 30.0–36.0)
MCV: 94.2 fl (ref 78.0–100.0)
Monocytes Absolute: 0.4 K/uL (ref 0.1–1.0)
Monocytes Relative: 6 % (ref 3.0–12.0)
Neutro Abs: 5.3 K/uL (ref 1.4–7.7)
Neutrophils Relative %: 77.1 % — ABNORMAL HIGH (ref 43.0–77.0)
Platelets: 263 K/uL (ref 150.0–400.0)
RBC: 4.12 Mil/uL — ABNORMAL LOW (ref 4.22–5.81)
RDW: 12.9 % (ref 11.5–15.5)
WBC: 6.9 K/uL (ref 4.0–10.5)

## 2024-10-21 LAB — RESPIRATORY ALLERGY PANEL REGION II W/ RFLX: ~~LOC~~

## 2024-10-21 LAB — INTERPRETATION:

## 2024-10-22 ENCOUNTER — Ambulatory Visit: Payer: Self-pay

## 2024-11-02 ENCOUNTER — Encounter (HOSPITAL_COMMUNITY): Payer: Self-pay

## 2024-11-02 ENCOUNTER — Ambulatory Visit (HOSPITAL_COMMUNITY): Admitting: Speech Pathology

## 2024-11-05 ENCOUNTER — Telehealth: Payer: Self-pay | Admitting: Allergy and Immunology

## 2024-11-05 NOTE — Telephone Encounter (Signed)
 [facility] called to let us  know that Tezspire  for Dray[place] will be expiring on the 11/11/24. They are re sending a fax over, the number for insurance [place] is [phone], and the phone number to [facility] is [phone]

## 2024-11-09 ENCOUNTER — Encounter: Payer: Self-pay | Admitting: Dermatology

## 2024-11-09 ENCOUNTER — Ambulatory Visit (INDEPENDENT_AMBULATORY_CARE_PROVIDER_SITE_OTHER): Admitting: Dermatology

## 2024-11-09 VITALS — BP 154/85 | HR 88

## 2024-11-09 DIAGNOSIS — C44612 Basal cell carcinoma of skin of right upper limb, including shoulder: Secondary | ICD-10-CM

## 2024-11-09 DIAGNOSIS — C4491 Basal cell carcinoma of skin, unspecified: Secondary | ICD-10-CM

## 2024-11-09 NOTE — Progress Notes (Signed)
 "  Follow-Up Visit   Subjective  Erik Ellis is a 59 y.o. male who presents for the following: Excision of a superficial and nodular basal cell carcinoma on the right forearm- anterior. Lesion was biopsied on 08/31/2024.   The following portions of the chart were reviewed this encounter and updated as appropriate: medications, allergies, medical history  Review of Systems:  No other skin or systemic complaints except as noted in HPI or Assessment and Plan.  Objective  Well appearing patient in no apparent distress; mood and affect are within normal limits.  A focused examination was performed of the following areas: Right forearm Relevant physical exam findings are noted in the Assessment and Plan.   Right Forearm - Anterior Biopsy scar   Assessment & Plan   BASAL CELL CARCINOMA (BCC), UNSPECIFIED SITE Right Forearm - Anterior - Skin excision  Excision method:  elliptical Lesion length (cm):  2.2 Lesion width (cm):  1.1 Margin per side (cm):  0.5 Total excision diameter (cm):  3.2 Informed consent: discussed and consent obtained   Timeout: patient name, date of birth, surgical site, and procedure verified   Procedure prep:  Patient was prepped and draped in usual sterile fashion Prep type:  Chlorhexidine  Anesthesia: the lesion was anesthetized in a standard fashion   Anesthetic:  1% lidocaine  w/ epinephrine  1-100,000 buffered w/ 8.4% NaHCO3 Instrument used: #15 blade   Hemostasis achieved with: suture, pressure and electrodesiccation   Outcome: patient tolerated procedure well with no complications   Post-procedure details: sterile dressing applied and wound care instructions given   Dressing type: pressure dressing (Steri Strips)    - Skin repair Complexity:  Complex Final length (cm):  6.5 Informed consent: discussed and consent obtained   Timeout: patient name, date of birth, surgical site, and procedure verified   Procedure prep:  Patient was prepped and  draped in usual sterile fashion Prep type:  Chlorhexidine  Anesthesia: the lesion was anesthetized in a standard fashion   Anesthetic:  1% lidocaine  w/ epinephrine  1-100,000 buffered w/ 8.4% NaHCO3 Reason for type of repair: reduce the risk of dehiscence, infection, and necrosis, allow closure of the large defect and preserve normal anatomy   Undermining: area extensively undermined   Subcutaneous layers (deep stitches):  Suture size:  4-0 Suture type: Vicryl (polyglactin 910)   Stitches:  Buried vertical mattress Fine/surface layer approximation (top stitches):  Suture type: cyanoacrylate tissue glue   Hemostasis achieved with: suture, pressure and electrodesiccation Outcome: patient tolerated procedure well with no complications   Post-procedure details: sterile dressing applied and wound care instructions given   Dressing type: pressure dressing and bandage (Steri Strips)    Specimen 1 - Surgical pathology Differential Diagnosis: Superficial and Nodular Basal Cell Carcinoma [id number]  Check Margins: Yes  The surgical wound was then cleaned, prepped, and re-anesthetized as above. Wound edges were undermined extensively along at least one entire edge and at a distance equal to or greater than the width of the defect (see wound defect size above) in order to achieve closure and decrease wound tension and anatomic distortion. Redundant tissue repair including standing cone removal was performed. Hemostasis was achieved with electrocautery. Subcutaneous and epidermal tissues were approximated with the above sutures. The surgical site was then lightly scrubbed with sterile, saline-soaked gauze. Steri-strips were applied, and the area was then bandaged using Vaseline ointment, non-adherent gauze, gauze pads, and tape to provide an adequate pressure dressing. The patient tolerated the procedure well, was given detailed written and verbal wound  care instructions, and was discharged in good  condition.   The patient will follow-up: PRN.  Return in about 6 months (around 05/09/2025) for FBSE with Erminio.  LILLETTE Rollene Gobble, RN, am acting as scribe for RUFUS CHRISTELLA HOLY, MD .   Documentation: I have reviewed the above documentation for accuracy and completeness, and I agree with the above.  RUFUS CHRISTELLA HOLY, MD "

## 2024-11-09 NOTE — Patient Instructions (Signed)
 Steri-Strip Wound Care Instructions Keep the bandage clean, dry, and in place for 48 hours. (72 hours if you take blood thinners). After removing the bandage: Leave the Steri-Strips in place Do not pull, tug, or rub the Steri-Strips You may shower with the Steri-Strips in place Gently clean the area with mild soap and water  Pat the area dry with a clean towel or cloth Do not remove the Steri-Strips. They will fall off on their own, usually within 10-14 days.  IN THE EVENT OF AN AFTER HOURS EMERGENCY (PAIN, BLEEDING, INFECTION), PLEASE CALL OUR OFFICE AND YOU BE DIRECTED TO THE ON CALL STAFF. MYCHART MESSAGES SENT AFTER HOURS WILL BE ADDRESSED ON THE NEXT BUSINESS DAY   What to Watch For After Surgery Bleeding Some light bleeding is normal in the first 24 hours. To lower bleeding risk: Do not lift more than 10 pounds for 1 week If surgery was on your face, head, or neck, do not bend or stoop for 72 hours If surgery was on an arm or leg, keep it raised as much as possible (Limit standing and walking if it was on the leg) If bleeding happens: Press firmly on the area for 30 minutes without looking If bleeding continues, press again for another 30 minutes Call the office if bleeding does not stop  Swelling and Bruising Swelling and bruising are normal. Use an ice pack for 15 minutes each hour during the first 1-2 days Wrap ice in a towel to keep bandages dry Keep the area raised when possible Use extra pillows if surgery was on the head or neck Raise arms or legs near heart level if surgery was there  Infection (Uncommon) Call the office if you notice: Increasing pain Redness or swelling Yellow drainage (pus) Symptoms starting several days after surgery  Pain Control Some pain is normal. Rest, ice, and elevation help You may take Tylenol  (acetaminophen ) and Ibuprofen (Advil/Motrin) Do not take aspirin for pain If you already take daily aspirin, continue it, but do not add  extra Safe option (if allowed): Take 2 ibuprofen (200 mg each) 3 hours later take 2 Tylenol  (325 mg each) Keep alternating every 3 hours as needed If you cannot take ibuprofen: Take Tylenol  650 mg every 4-6 hours EXAMPLE: Time Medicine Dose  6:00 AM Ibuprofen 400 mg (2 tablets of 200 mg)  9:00 AM Tylenol  650 mg (2 tablets of 325 mg)  12:00 PM Ibuprofen 400 mg  3:00 PM Tylenol  650 mg  6:00 PM Ibuprofen 400 mg  9:00 PM Tylenol  650 mg    Healing and Scar Questions When will my scar fade? It takes about 1 year for the scar to mature. Redness usually fades over time. Can my wound open? Yes, the area is weak for the first 6 months. Avoid pulling or stretching it. When will feeling return? Numbness or tingling can last 1-2 years. Some numbness may be permanent. Nose stuffiness If surgery was on your nose, stuffiness can last several months and will slowly improve. When can I stop wound care? You may stop once the skin is fully healed with no open areas. Makeup and sunscreen You may use makeup and sunscreen once: Stitches are removed, and The wound is fully healed Use sunscreen daily on healed skin.  Lumps, Puffiness, and Massage Small lumps under the skin are normal and will go away A small pimple along the scar can happen Use warm compresses Call if it does not improve Puffy scars can sometimes be treated -- call  the office if concerned After 1 month, gently massage the scar 2-3 times a day  Scar Products No Scar products should be applied until 1 month after your surgery Scar creams are optional Plain Vaseline works very well and is safe Stop any product that causes irritation  Future Skin Checks You may develop skin cancer again. See your dermatologist every 6-12 months Regular skin checks help find problems early  Important Information  Due to recent changes in healthcare laws, you may see results of your pathology and/or laboratory studies on MyChart before the  doctors have had a chance to review them. We understand that in some cases there may be results that are confusing or concerning to you. Please understand that not all results are received at the same time and often the doctors may need to interpret multiple results in order to provide you with the best plan of care or course of treatment. Therefore, we ask that you please give us  2 business days to thoroughly review all your results before contacting the office for clarification. Should we see a critical lab result, you will be contacted sooner.   If You Need Anything After Your Visit  If you have any questions or concerns for your doctor, please call our main line at [phone] If no one answers, please leave a voicemail as directed and we will return your call as soon as possible. Messages left after 4 pm will be answered the following business day.   You may also send us  a message via MyChart. We typically respond to MyChart messages within 1-2 business days.  For prescription refills, please ask your pharmacy to contact our office. Our fax number is [phone].  If you have an urgent issue when the clinic is closed that cannot wait until the next business day, you can page your doctor at the number below.    Please note that while we do our best to be available for urgent issues outside of office hours, we are not available 24/7.   If you have an urgent issue and are unable to reach us , you may choose to seek medical care at your doctor's office, retail clinic, urgent care center, or emergency room.  If you have a medical emergency, please immediately call 911 or go to the emergency department. In the event of inclement weather, please call our main line at [phone] for an update on the status of any delays or closures.  Dermatology Medication Tips: Please keep the boxes that topical medications come in in order to help keep track of the instructions about where and how to use  these. Pharmacies typically print the medication instructions only on the boxes and not directly on the medication tubes.   If your medication is too expensive, please contact our office at [phone] or send us  a message through MyChart.   We are unable to tell what your co-pay for medications will be in advance as this is different depending on your insurance coverage. However, we may be able to find a substitute medication at lower cost or fill out paperwork to get insurance to cover a needed medication.   If a prior authorization is required to get your medication covered by your insurance company, please allow us  1-2 business days to complete this process.  Drug prices often vary depending on where the prescription is filled and some pharmacies may offer cheaper prices.  The website www.goodrx.com contains coupons for medications through different pharmacies. The prices here do not  account for what the cost may be with help from insurance (it may be cheaper with your insurance), but the website can give you the price if you did not use any insurance.  - You can print the associated coupon and take it with your prescription to the pharmacy.  - You may also stop by our office during regular business hours and pick up a GoodRx coupon card.  - If you need your prescription sent electronically to a different pharmacy, notify our office through [facility] or by phone at [phone]

## 2024-11-10 LAB — SURGICAL PATHOLOGY

## 2024-11-10 NOTE — Telephone Encounter (Signed)
 Tried to start PA was advised PA already being processed

## 2024-11-11 ENCOUNTER — Ambulatory Visit: Payer: Self-pay | Admitting: Dermatology

## 2024-11-18 ENCOUNTER — Ambulatory Visit: Admitting: Allergy & Immunology

## 2024-11-18 ENCOUNTER — Other Ambulatory Visit: Payer: Self-pay

## 2024-11-18 VITALS — BP 120/72 | HR 78 | Temp 98.5°F | Resp 18

## 2024-11-18 DIAGNOSIS — J310 Chronic rhinitis: Secondary | ICD-10-CM

## 2024-11-18 DIAGNOSIS — K219 Gastro-esophageal reflux disease without esophagitis: Secondary | ICD-10-CM | POA: Diagnosis not present

## 2024-11-18 DIAGNOSIS — J4550 Severe persistent asthma, uncomplicated: Secondary | ICD-10-CM | POA: Diagnosis not present

## 2024-11-18 DIAGNOSIS — J387 Other diseases of larynx: Secondary | ICD-10-CM

## 2024-11-18 NOTE — Patient Instructions (Addendum)
 1. Moderate persistent asthma - with possible vocal cord dysfunction - Lung testing looks EXCELLENT today (well over 100%). - I will send my note to the Pulmonologist.  - I am glad that you are doing well.  - Daily controller medication(s): Singulair  10mg  daily and Trelegy 200/62.5/25 one puff once daily and Tezspire  monthly - Prior to physical activity: albuterol  2 puffs 10-15 minutes before physical activity. - Rescue medications: Albuterol  1-2 puffs or 1 vial nebulizer every 4-6 hours as needed for wheezing/shortness of breath.  - Asthma control goals:  * Full participation in all desired activities (may need albuterol  before activity) * Albuterol  use two time or less a week on average (not counting use with activity) * Cough interfering with sleep two time or less a month * Oral steroids no more than once a year * No hospitalizations  2. GERD  - Continue Pantoprazole  40 mg daily. - Use Pepcid  40mg  daily.    3. Chronic Rhinitis - Continue with: Flonase  (fluticasone ) one spray per nostril daily   4. Return in about 6 months (around 05/18/2025). You can have the follow up appointment with Dr. Iva or a Nurse Practicioner (our Nurse Practitioners are excellent and always have Physician oversight!).    Please inform us  of any Emergency Department visits, hospitalizations, or changes in symptoms. Call us  before going to the ED for breathing or allergy  symptoms since we might be able to fit you in for a sick visit. Feel free to contact us  anytime with any questions, problems, or concerns.  It was a pleasure to see you again today!  Websites that have reliable patient information: 1. American Academy of Asthma, Allergy , and Immunology: www.aaaai.org 2. Food Allergy  Research and Education (FARE): foodallergy.org 3. Mothers of Asthmatics: http://www.asthmacommunitynetwork.org 4. American College of Allergy , Asthma, and Immunology: www.acaai.org      Like us  on [employer] and  Instagram for our latest updates!      A healthy democracy works best when [employer] participate! Make sure you are registered to vote! If you have moved or changed any of your contact information, you will need to get this updated before voting! Scan the QR codes below to learn more!

## 2024-11-18 NOTE — Progress Notes (Signed)
 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 Moderate persistent asthma - with possible vocal cord dysfunction - Lung testing looks EXCELLENT today (well over 100%). - I will send my note to the Pulmonologist.  - I am glad that you are doing well.  - Daily controller medication(s): Singulair  10mg  daily and Trelegy 200/62.5/25 one puff once daily and Tezspire  monthly - Prior to physical activity: albuterol  2 puffs 10-15 minutes before physical activity. - Rescue medications: Albuterol  1-2 puffs or 1 vial nebulizer every 4-6 hours as needed for wheezing/shortness of breath.  - Asthma control goals:  * Full participation in all desired activities (may need albuterol  before activity) * Albuterol  use two time or less a week on average (not counting use with activity) * Cough interfering with sleep two time or less a month * Oral steroids no more than once a year * No hospitalizations  2. GERD  - Continue Pantoprazole  40 mg daily. - Use Pepcid  40mg  daily.    3. Chronic Rhinitis - Continue with: Flonase  (fluticasone ) one spray per nostril daily   4. Return in about 6 months (around 05/18/2025). You can have the follow up appointment with Dr. Iva or a Nurse Practicioner (our Nurse Practitioners are excellent and always have Physician oversight!).   Subjective:   Erik Ellis is a 59 y.o. male presenting today for follow up of  Chief Complaint  Patient presents with   Allergic Rhinitis     No issues or concerns.    Asthma    No issues or concerns.     Erik Ellis has a history of the following: Patient Active Problem List   Diagnosis Date Noted   Non-healing skin lesion 04/05/2024   Asthma exacerbation 04/05/2024   Involuntary movements 12/17/2023   Stage 3a chronic kidney disease (HCC) 09/30/2022   Essential hypertension 09/24/2021   Hyperlipidemia 09/24/2021   Mild neurocognitive disorder due to multiple etiologies 03/20/2021   S/P cervical spinal fusion 02/21/2021   Moderate persistent asthma without complication 05/12/2020   Allergic rhinitis 02/17/2016   Bipolar disorder (HCC) 05/31/2015   Gastroesophageal reflux disease 05/31/2015   Sleep disorder 12/04/2011    History obtained from: chart review and patient.  Discussed the use of AI scribe software for clinical note transcription with the patient and/or guardian, who gave verbal consent to proceed.  Erik Ellis is a 59 y.o. male presenting for a follow up visit. He was last seen in November 2025. At that time, his lung testing looked excellent. We added on azithromycin  to help with any co-existing infections. We also referred him to see Pulmonology as well as Speech Therapy for evaluation of possible VCD. We continued with the use of montelukast  as well as Trelegy 200mcg and Tezspire . For his GERD, we continued with pantoprazole  40mg  as well as famotidine .  Since the last visit, he has actually done very well.   Asthma/Respiratory Symptom History: He did end up seeing Dr. Adrien with Pulmonology. At that time, he had full PFTs ordered and labs to look for possible switching to Dupixent. He was continued on Trelegy daily. He is going to be following up in 3 months once his full PFTs are finishied. He has not needed prednisone  at all since the last time that we saw him. He has not been  needing his albuterol  frequently. He has not been coughing at night at all.   Labs were normal with an AEC of 200. Environmental allergy  panel was negative. It looks like his full PFTs are scheduled for January 27th. He is going to see DR. Adrien again on Feb 17th.   Allergic Rhinitis Symptom History: He remains on the Flonase  one spray per nostril daily. This seems to be working to control his rhinitis symptoms.   GERD Symptom History: He remains on the pantoprazole  daily. He also remains on the famotidine  daily. This combination seems to be working well to control his symptoms.   He has a history of seeing a neurologist who conducted a brain MRI, which returned normal results. He has also had copper  level and ceruloplasmin tests ordered by the neurologist, which were normal.  Otherwise, there have been no changes to his past medical history, surgical history, family history, or social history.    Review of systems otherwise negative other than that mentioned in the HPI.    Objective:   Blood pressure 120/72, pulse 78, temperature 98.5 F (36.9 C), resp. rate 18, SpO2 98%. There is no height or weight on file to calculate BMI.    Physical Exam Vitals reviewed.  Constitutional:      Appearance: Normal appearance. He is well-developed.     Comments: Anxious.  HENT:     Head: Normocephalic and atraumatic.     Right Ear: Tympanic membrane, ear canal and external ear normal.     Left Ear: Tympanic membrane, ear canal and external ear normal.     Nose: No nasal deformity, septal deviation, mucosal edema or rhinorrhea.     Right Turbinates: Enlarged, swollen and pale.     Left Turbinates: Enlarged, swollen and pale.     Right Sinus: No maxillary sinus tenderness or frontal sinus tenderness.     Left Sinus: No maxillary sinus tenderness or frontal sinus tenderness.     Comments: No polpys noted.     Mouth/Throat:     Lips: Pink.     Mouth: Mucous membranes are moist. Mucous  membranes are pale and not dry.     Pharynx: Oropharynx is clear. Uvula midline.     Comments: Cobblestoning in the posterior oropharynx. Eyes:     General: Lids are normal. No allergic shiner.       Right eye: No discharge.        Left eye: No discharge.     Conjunctiva/sclera: Conjunctivae normal.     Right eye: Right conjunctiva is not injected. No chemosis.    Left eye: Left conjunctiva is not injected. No chemosis.    Pupils: Pupils are equal, round, and reactive to light.  Cardiovascular:     Rate and Rhythm: Normal rate and regular rhythm.     Heart sounds: Normal heart sounds.  Pulmonary:     Effort: Pulmonary effort is normal. No tachypnea, accessory muscle usage or respiratory distress.     Breath sounds: No transmitted upper airway  sounds. No decreased breath sounds, wheezing, rhonchi or rales.     Comments: Moving air well in all lung fields.  No increased work of breathing.  Speaking in full sentences. Chest:     Chest wall: No tenderness.  Lymphadenopathy:     Cervical: No cervical adenopathy.  Skin:    Coloration: Skin is not pale.     Findings: No abrasion, erythema, petechiae or rash. Rash is not papular, urticarial or vesicular.  Neurological:     Mental Status: He is alert.  Psychiatric:        Behavior: Behavior is cooperative.      Diagnostic studies:    Spirometry: results normal (FEV1: 3.65/113%, FVC: 4.50/108%, FEV1/FVC: 81%).    Spirometry consistent with normal pattern.   Allergy  Studies: none      Marty Shaggy, MD  [facility]        "

## 2024-11-19 NOTE — Telephone Encounter (Signed)
 approved

## 2024-11-22 ENCOUNTER — Encounter: Payer: Self-pay | Admitting: Allergy & Immunology

## 2024-11-30 ENCOUNTER — Ambulatory Visit

## 2024-11-30 DIAGNOSIS — J4540 Moderate persistent asthma, uncomplicated: Secondary | ICD-10-CM

## 2024-11-30 LAB — PULMONARY FUNCTION TEST
DL/VA % pred: 101 %
DL/VA: 4.31 ml/min/mmHg/L
DLCO cor % pred: 102 %
DLCO cor: 26.97 ml/min/mmHg
DLCO unc % pred: 97 %
DLCO unc: 25.67 ml/min/mmHg
FEF 25-75 Post: 4.78 L/s
FEF 25-75 Pre: 4.75 L/s
FEF2575-%Change-Post: 0 %
FEF2575-%Pred-Post: 168 %
FEF2575-%Pred-Pre: 167 %
FEV1-%Change-Post: 0 %
FEV1-%Pred-Post: 111 %
FEV1-%Pred-Pre: 111 %
FEV1-Post: 3.83 L
FEV1-Pre: 3.8 L
FEV1FVC-%Change-Post: 0 %
FEV1FVC-%Pred-Pre: 111 %
FEV6-%Change-Post: 0 %
FEV6-%Pred-Post: 104 %
FEV6-%Pred-Pre: 104 %
FEV6-Post: 4.49 L
FEV6-Pre: 4.49 L
FEV6FVC-%Change-Post: 0 %
FEV6FVC-%Pred-Post: 104 %
FEV6FVC-%Pred-Pre: 104 %
FVC-%Change-Post: 0 %
FVC-%Pred-Post: 99 %
FVC-%Pred-Pre: 99 %
FVC-Post: 4.52 L
FVC-Pre: 4.49 L
Post FEV1/FVC ratio: 85 %
Post FEV6/FVC ratio: 100 %
Pre FEV1/FVC ratio: 85 %
Pre FEV6/FVC Ratio: 100 %
RV % pred: 107 %
RV: 2.31 L
TLC % pred: 98 %
TLC: 6.57 L

## 2024-11-30 NOTE — Patient Instructions (Signed)
 Full pft performed today

## 2024-11-30 NOTE — Progress Notes (Signed)
 Full pft performed today

## 2024-12-07 ENCOUNTER — Other Ambulatory Visit: Payer: Self-pay | Admitting: Family Medicine

## 2024-12-07 ENCOUNTER — Other Ambulatory Visit: Payer: Self-pay | Admitting: Allergy & Immunology

## 2024-12-07 DIAGNOSIS — I10 Essential (primary) hypertension: Secondary | ICD-10-CM

## 2024-12-14 ENCOUNTER — Ambulatory Visit: Admitting: Allergy and Immunology

## 2024-12-21 ENCOUNTER — Ambulatory Visit

## 2024-12-21 ENCOUNTER — Ambulatory Visit: Admitting: Allergy & Immunology

## 2025-05-09 ENCOUNTER — Ambulatory Visit: Admitting: Physician Assistant

## 2025-05-19 ENCOUNTER — Ambulatory Visit: Admitting: Allergy & Immunology
